# Patient Record
Sex: Female | Born: 1950
Health system: Southern US, Community
[De-identification: ages and names within clinical notes are randomized; demographics above are authoritative.]

## PROBLEM LIST (undated history)

## (undated) ENCOUNTER — Emergency Department (HOSPITAL_BASED_OUTPATIENT_CLINIC_OR_DEPARTMENT_OTHER): Payer: 59 | Source: Home / Self Care

## (undated) DIAGNOSIS — K219 Gastro-esophageal reflux disease without esophagitis: Secondary | ICD-10-CM

## (undated) DIAGNOSIS — M069 Rheumatoid arthritis, unspecified: Secondary | ICD-10-CM

## (undated) DIAGNOSIS — I10 Essential (primary) hypertension: Secondary | ICD-10-CM

## (undated) HISTORY — PX: NASAL SINUS SURGERY: SHX719

## (undated) HISTORY — PX: FINGER FRACTURE SURGERY: SHX638

## (undated) HISTORY — DX: Gastro-esophageal reflux disease without esophagitis: K21.9

## (undated) HISTORY — PX: OTHER SURGICAL HISTORY: SHX169

## (undated) HISTORY — PX: CERVICAL CONIZATION W/BX: SHX1330

## (undated) HISTORY — DX: Essential (primary) hypertension: I10

## (undated) HISTORY — PX: TONSILLECTOMY: SUR1361

## (undated) HISTORY — DX: Rheumatoid arthritis, unspecified: M06.9

## (undated) HISTORY — PX: BACK SURGERY: SHX140

---

## 1999-02-03 ENCOUNTER — Encounter: Admission: RE | Admit: 1999-02-03 | Discharge: 1999-05-04 | Payer: Self-pay | Admitting: Anesthesiology

## 2003-06-18 ENCOUNTER — Ambulatory Visit (HOSPITAL_COMMUNITY): Admission: RE | Admit: 2003-06-18 | Discharge: 2003-06-18 | Payer: Self-pay | Admitting: Unknown Physician Specialty

## 2003-06-18 ENCOUNTER — Encounter: Payer: Self-pay | Admitting: Unknown Physician Specialty

## 2005-03-04 ENCOUNTER — Ambulatory Visit (HOSPITAL_COMMUNITY): Admission: RE | Admit: 2005-03-04 | Discharge: 2005-03-04 | Payer: Self-pay | Admitting: *Deleted

## 2005-09-18 ENCOUNTER — Ambulatory Visit: Payer: Self-pay | Admitting: Cardiology

## 2005-09-21 ENCOUNTER — Ambulatory Visit: Payer: Self-pay | Admitting: Cardiology

## 2005-09-22 ENCOUNTER — Ambulatory Visit: Payer: Self-pay | Admitting: Cardiology

## 2005-09-25 ENCOUNTER — Inpatient Hospital Stay (HOSPITAL_BASED_OUTPATIENT_CLINIC_OR_DEPARTMENT_OTHER): Admission: RE | Admit: 2005-09-25 | Discharge: 2005-09-25 | Payer: Self-pay | Admitting: Cardiology

## 2005-09-25 ENCOUNTER — Ambulatory Visit: Payer: Self-pay | Admitting: Cardiology

## 2005-09-30 ENCOUNTER — Ambulatory Visit: Payer: Self-pay | Admitting: Cardiology

## 2005-10-22 ENCOUNTER — Ambulatory Visit: Payer: Self-pay | Admitting: Cardiology

## 2006-03-31 ENCOUNTER — Ambulatory Visit (HOSPITAL_COMMUNITY): Admission: RE | Admit: 2006-03-31 | Discharge: 2006-03-31 | Payer: Self-pay | Admitting: Unknown Physician Specialty

## 2006-05-25 ENCOUNTER — Ambulatory Visit (HOSPITAL_COMMUNITY): Admission: RE | Admit: 2006-05-25 | Discharge: 2006-05-25 | Payer: Self-pay | Admitting: Internal Medicine

## 2006-05-25 ENCOUNTER — Ambulatory Visit: Payer: Self-pay | Admitting: Internal Medicine

## 2007-04-04 ENCOUNTER — Ambulatory Visit (HOSPITAL_COMMUNITY): Admission: RE | Admit: 2007-04-04 | Discharge: 2007-04-04 | Payer: Self-pay | Admitting: Unknown Physician Specialty

## 2007-04-23 ENCOUNTER — Encounter: Admission: RE | Admit: 2007-04-23 | Discharge: 2007-04-23 | Payer: Self-pay | Admitting: Internal Medicine

## 2008-04-04 ENCOUNTER — Ambulatory Visit (HOSPITAL_COMMUNITY): Admission: RE | Admit: 2008-04-04 | Discharge: 2008-04-04 | Payer: Self-pay | Admitting: Internal Medicine

## 2009-04-08 ENCOUNTER — Ambulatory Visit (HOSPITAL_COMMUNITY): Admission: RE | Admit: 2009-04-08 | Discharge: 2009-04-08 | Payer: Self-pay | Admitting: Unknown Physician Specialty

## 2009-06-18 ENCOUNTER — Ambulatory Visit (HOSPITAL_COMMUNITY): Admission: RE | Admit: 2009-06-18 | Discharge: 2009-06-19 | Payer: Self-pay | Admitting: Neurosurgery

## 2010-04-21 ENCOUNTER — Ambulatory Visit (HOSPITAL_COMMUNITY): Admission: RE | Admit: 2010-04-21 | Discharge: 2010-04-21 | Payer: Self-pay | Admitting: Internal Medicine

## 2010-11-19 ENCOUNTER — Ambulatory Visit (HOSPITAL_COMMUNITY): Payer: Self-pay | Admitting: Psychiatry

## 2010-12-09 ENCOUNTER — Ambulatory Visit (HOSPITAL_COMMUNITY)
Admission: RE | Admit: 2010-12-09 | Discharge: 2010-12-09 | Payer: Self-pay | Source: Home / Self Care | Attending: Psychiatry | Admitting: Psychiatry

## 2010-12-23 ENCOUNTER — Ambulatory Visit (HOSPITAL_COMMUNITY)
Admission: RE | Admit: 2010-12-23 | Discharge: 2010-12-23 | Payer: Self-pay | Source: Home / Self Care | Attending: Psychiatry | Admitting: Psychiatry

## 2010-12-28 ENCOUNTER — Encounter: Payer: Self-pay | Admitting: Internal Medicine

## 2011-01-05 ENCOUNTER — Ambulatory Visit (HOSPITAL_COMMUNITY): Admit: 2011-01-05 | Payer: Self-pay | Admitting: Psychiatry

## 2011-01-05 ENCOUNTER — Ambulatory Visit (HOSPITAL_COMMUNITY)
Admission: RE | Admit: 2011-01-05 | Discharge: 2011-01-05 | Payer: Self-pay | Source: Home / Self Care | Attending: Psychiatry | Admitting: Psychiatry

## 2011-01-22 ENCOUNTER — Encounter (INDEPENDENT_AMBULATORY_CARE_PROVIDER_SITE_OTHER): Payer: 59 | Admitting: Psychiatry

## 2011-01-22 ENCOUNTER — Encounter (HOSPITAL_COMMUNITY): Payer: Self-pay | Admitting: Psychiatry

## 2011-01-22 DIAGNOSIS — F329 Major depressive disorder, single episode, unspecified: Secondary | ICD-10-CM

## 2011-01-22 DIAGNOSIS — F411 Generalized anxiety disorder: Secondary | ICD-10-CM

## 2011-01-29 ENCOUNTER — Encounter (HOSPITAL_COMMUNITY): Payer: 59 | Admitting: Psychiatry

## 2011-02-03 ENCOUNTER — Encounter (INDEPENDENT_AMBULATORY_CARE_PROVIDER_SITE_OTHER): Payer: 59 | Admitting: Psychiatry

## 2011-02-03 DIAGNOSIS — F411 Generalized anxiety disorder: Secondary | ICD-10-CM

## 2011-02-12 ENCOUNTER — Encounter (INDEPENDENT_AMBULATORY_CARE_PROVIDER_SITE_OTHER): Payer: 59 | Admitting: Psychiatry

## 2011-02-12 DIAGNOSIS — F329 Major depressive disorder, single episode, unspecified: Secondary | ICD-10-CM

## 2011-02-12 DIAGNOSIS — F411 Generalized anxiety disorder: Secondary | ICD-10-CM

## 2011-02-13 ENCOUNTER — Ambulatory Visit (HOSPITAL_COMMUNITY)
Admission: RE | Admit: 2011-02-13 | Discharge: 2011-02-13 | Disposition: A | Discharge status: Home / Self Care | Payer: 59 | Source: Ambulatory Visit | Attending: Internal Medicine | Admitting: Internal Medicine

## 2011-02-13 DIAGNOSIS — IMO0001 Reserved for inherently not codable concepts without codable children: Secondary | ICD-10-CM | POA: Insufficient documentation

## 2011-02-13 DIAGNOSIS — R269 Unspecified abnormalities of gait and mobility: Secondary | ICD-10-CM | POA: Insufficient documentation

## 2011-02-13 DIAGNOSIS — R42 Dizziness and giddiness: Secondary | ICD-10-CM | POA: Insufficient documentation

## 2011-02-17 ENCOUNTER — Ambulatory Visit (HOSPITAL_COMMUNITY)
Admission: RE | Admit: 2011-02-17 | Discharge: 2011-02-17 | Disposition: A | Discharge status: Home / Self Care | Payer: 59 | Source: Ambulatory Visit | Attending: *Deleted | Admitting: *Deleted

## 2011-02-19 ENCOUNTER — Ambulatory Visit (HOSPITAL_COMMUNITY): Payer: 59 | Admitting: *Deleted

## 2011-02-24 ENCOUNTER — Ambulatory Visit (HOSPITAL_COMMUNITY)
Admission: RE | Admit: 2011-02-24 | Discharge: 2011-02-24 | Disposition: A | Discharge status: Home / Self Care | Payer: 59 | Source: Ambulatory Visit | Attending: *Deleted | Admitting: *Deleted

## 2011-02-24 ENCOUNTER — Encounter (INDEPENDENT_AMBULATORY_CARE_PROVIDER_SITE_OTHER): Payer: 59 | Admitting: Psychiatry

## 2011-02-24 DIAGNOSIS — F411 Generalized anxiety disorder: Secondary | ICD-10-CM

## 2011-03-03 ENCOUNTER — Ambulatory Visit (HOSPITAL_COMMUNITY)
Admission: RE | Admit: 2011-03-03 | Discharge: 2011-03-03 | Disposition: A | Discharge status: Home / Self Care | Payer: 59 | Source: Ambulatory Visit | Attending: *Deleted | Admitting: *Deleted

## 2011-03-10 ENCOUNTER — Encounter (INDEPENDENT_AMBULATORY_CARE_PROVIDER_SITE_OTHER): Payer: 59 | Admitting: Psychiatry

## 2011-03-10 ENCOUNTER — Ambulatory Visit (HOSPITAL_COMMUNITY)
Admission: RE | Admit: 2011-03-10 | Discharge: 2011-03-10 | Disposition: A | Discharge status: Home / Self Care | Payer: 59 | Source: Ambulatory Visit | Attending: Internal Medicine | Admitting: Internal Medicine

## 2011-03-10 DIAGNOSIS — F329 Major depressive disorder, single episode, unspecified: Secondary | ICD-10-CM

## 2011-03-10 DIAGNOSIS — R42 Dizziness and giddiness: Secondary | ICD-10-CM | POA: Insufficient documentation

## 2011-03-10 DIAGNOSIS — F411 Generalized anxiety disorder: Secondary | ICD-10-CM

## 2011-03-10 DIAGNOSIS — R269 Unspecified abnormalities of gait and mobility: Secondary | ICD-10-CM | POA: Insufficient documentation

## 2011-03-10 DIAGNOSIS — IMO0001 Reserved for inherently not codable concepts without codable children: Secondary | ICD-10-CM | POA: Insufficient documentation

## 2011-03-15 LAB — URINALYSIS, ROUTINE W REFLEX MICROSCOPIC
Ketones, ur: NEGATIVE mg/dL
Nitrite: NEGATIVE
pH: 6.5 (ref 5.0–8.0)

## 2011-03-15 LAB — BASIC METABOLIC PANEL
BUN: 20 mg/dL (ref 6–23)
GFR calc Af Amer: >60 mL/min (ref >60)
Glucose, Bld: 89 mg/dL (ref 70–99)
Sodium: 140 mEq/L (ref 135–145)

## 2011-03-15 LAB — CBC
MCHC: 34.6 g/dL (ref 30.0–36.0)
MCV: 86.5 fL (ref 78.0–100.0)
Platelets: 136 10*3/uL — ABNORMAL LOW (ref 150–400)
RBC: 3.95 MIL/uL (ref 3.87–5.11)
RDW: 14.9 % (ref 11.5–15.5)
WBC: 3.3 10*3/uL — ABNORMAL LOW (ref 4.0–10.5)

## 2011-03-16 ENCOUNTER — Ambulatory Visit (HOSPITAL_COMMUNITY)
Admission: RE | Admit: 2011-03-16 | Discharge: 2011-03-16 | Disposition: A | Discharge status: Home / Self Care | Payer: 59 | Source: Ambulatory Visit | Attending: *Deleted | Admitting: *Deleted

## 2011-03-23 ENCOUNTER — Other Ambulatory Visit (HOSPITAL_COMMUNITY): Payer: Self-pay | Admitting: Internal Medicine

## 2011-03-23 DIAGNOSIS — Z139 Encounter for screening, unspecified: Secondary | ICD-10-CM

## 2011-03-31 ENCOUNTER — Encounter (INDEPENDENT_AMBULATORY_CARE_PROVIDER_SITE_OTHER): Payer: 59 | Admitting: Psychiatry

## 2011-03-31 ENCOUNTER — Ambulatory Visit (HOSPITAL_COMMUNITY): Payer: 59 | Admitting: Physical Therapy

## 2011-03-31 DIAGNOSIS — F411 Generalized anxiety disorder: Secondary | ICD-10-CM

## 2011-03-31 DIAGNOSIS — F329 Major depressive disorder, single episode, unspecified: Secondary | ICD-10-CM

## 2011-04-21 NOTE — Discharge Summary (Signed)
Tiffany Pollard, Tiffany Pollard               ACCOUNT NO.:  [id number]   MEDICAL RECORD NO.:  [id number]          PATIENT TYPE:  OIB   LOCATION:  3533                         FACILITY:  [facility]   PHYSICIAN:  Coletta Memos, M.D.     DATE OF BIRTH:  May 08, 1951   DATE OF ADMISSION:  06/18/2009  DATE OF DISCHARGE:  06/19/2009                               DISCHARGE SUMMARY   ADMITTING DIAGNOSES:  1. Displaced disk, right L4-5.  2. Right L5 radiculopathy.   POSTOPERATIVE DIAGNOSES:  1. Displaced disk, right L4-5.  2. Right L5 radiculopathy.   PROCEDURE:  Right L4-5 semi-hemilaminectomy with removal of the fragment  disk space with microdissection use.  Surgeon, Dr. Franky Macho.   DISCHARGE STATUS:  Alive and well.   DESTINATION:  Home.   MEDICATIONS:  None were given.  She has Darvocet to take at home.   She has voided, ambulated, and able to eat without difficulty.  Wound is  clean, dry.  There are no signs of infection at discharge.   Ms. Toren presented to my office yesterday in severe pain.  MRI showed  a very large disk fragment on right side at L4-5.  I, therefore, offered  and she agreed to undergo operative decompression yesterday.  She had  had time without relief.  She was weak also on the right lower  extremity.  She was admitted, taken to the operating room, had  absolutely no problems during her procedure.  Postoperatively, she was  doing much better, had some numbness in the right lower extremity which  is not unusual, and she will be discharged home.  She is given  instructions no heavy bending, lifting, twisting, turning.  Told to call  us in approximately for an appointment in 3-4 weeks.           ______________________________  Coletta Memos, M.D.     KC/MEDQ  D:  06/19/2009  T:  06/20/2009  Job:  [id number]

## 2011-04-21 NOTE — Op Note (Signed)
Tiffany Pollard, Tiffany Pollard               ACCOUNT NO.:  [id number]   MEDICAL RECORD NO.:  [id number]          PATIENT TYPE:  OIB   LOCATION:  3533                         FACILITY:  [facility]   PHYSICIAN:  Coletta Memos, M.D.     DATE OF BIRTH:  [birth year]   DATE OF PROCEDURE:  06/18/2009  DATE OF DISCHARGE:                               OPERATIVE REPORT   PREOPERATIVE DIAGNOSIS:  Displaced disk, right L4-5.   POSTOPERATIVE DIAGNOSIS:  Displaced disk, right L4-5.   PROCEDURE:  Right L4-5 semi-hemilaminectomy and diskectomy.   COMPLICATIONS:  None.   SURGEON:  Coletta Memos, MD   ASSISTANT:  Stefani Dama, MD   ANESTHESIA:  General endotracheal.   INDICATIONS:  Ms. Tiffany Pollard is a 60 year old presenting with severe  pain in the right lower extremity.  She has had this now for  approximately 1 month.  MRI shows a large disk herniation on the right  side at L4-5.  She underwent chiropractic treatment without benefit.  I  recommended she agree to undergo operative decompression.   OPERATIVE NOTE:  Tiffany Pollard was brought to the operating room,  intubated, and placed under general anesthetic.  She was rolled prone  onto a Wilson frame and all pressure points were properly padded.  Her  back was prepped.  She was draped in a sterile fashion.  I infiltrated  20 mL of 0.5% lidocaine with 1:200,000 strength epinephrine into the  lumbar region and paraspinous area.  I opened the skin with #10 blade.  I took the initial incision down to the thoracolumbar fascia.  I then  exposed the lamina of what was L4 and L5.  I used an intraoperative x-  ray to confirm my interlaminar location.  I placed a self-retaining  retractor and then proceeded with my semi-hemilaminectomy.   I used a high-speed drill and performed a semi-hemilaminectomy of L4 on  the right side.  I removed the ligamentum flavum afterwards and exposed  the thecal sac.  I brought the microscope into the operative field and  with Dr.  Verlee Rossetti assistance, we proceeded with the diskectomy.   Using microdissection, we were able to complete the diskectomy by  removing 3 very large chunks of material.  This had obviously come out  of the disk space, but we did not need to enter the disk space.  The  nerve was certainly decompressed.  Significant amount of epidural  bleeding which was easily controlled with Gelfoam and some slight  pressure that was controlled prior to closure.  With the microscope, we  were able to see that there were no piece left  behind.  I inspected medially, laterally, rostrally, and caudally.  I  then irrigated the wound.  I then closed wound in layered fashion using  Vicryl sutures to reapproximate the thoracolumbar, subcutaneous, and  subcuticular layers.  I used Dermabond for sterile dressing.           ______________________________  Coletta Memos, M.D.     KC/MEDQ  D:  06/18/2009  T:  06/19/2009  Job:  [id number]

## 2011-04-24 ENCOUNTER — Ambulatory Visit (HOSPITAL_COMMUNITY)
Admission: RE | Admit: 2011-04-24 | Discharge: 2011-04-24 | Disposition: A | Discharge status: Home / Self Care | Payer: 59 | Source: Ambulatory Visit | Attending: Internal Medicine | Admitting: Internal Medicine

## 2011-04-24 DIAGNOSIS — Z1231 Encounter for screening mammogram for malignant neoplasm of breast: Secondary | ICD-10-CM | POA: Insufficient documentation

## 2011-04-24 DIAGNOSIS — Z139 Encounter for screening, unspecified: Secondary | ICD-10-CM

## 2011-04-24 NOTE — Cardiovascular Report (Signed)
NAMEDATRA, CLARY               ACCOUNT NO.:  [id number]   MEDICAL RECORD NO.:  [id number]          PATIENT TYPE:  OIB   LOCATION:  1966                         FACILITY:  [facility]   PHYSICIAN:  Rollene Rotunda, M.D.   DATE OF BIRTH:  Nov 03, 1951   DATE OF PROCEDURE:  09/25/2005  DATE OF DISCHARGE:                              CARDIAC CATHETERIZATION   PRIMARY CARE PHYSICIAN:  Dr. Weyman Pedro   CARDIOLOGIST:  Dr. Jonelle Sidle, [facility] [place].   PROCEDURE NOTE:  1.  Left heart catheterization.  2.  Left coronary angiography.   INDICATIONS:  Evaluate patient with chest pain and abnormal Cardiolite  suggesting possible inferior ischemia.   PROCEDURE NOTE:  Left heart catheterization was performed via the right  femoral artery. The artery was cannulated using an anterior wall puncture. A  #4 French arterial sheath was inserted via the modified Seldinger technique.  A preformed Judkins and pigtail catheter were utilized.  The patient  tolerated the procedure well and left the lab in stable condition.   RESULTS/HEMODYNAMICS:  LV 184/21,  AO 177/89.   CORONARIES:  1.  The left main was normal.  2.  The LAD was normal.  The first diagonal was moderate sized and normal.      The second diagonal was moderate size and normal.  3.  The circumflex and the ramus intermediate was large and normal.  There      was a mid obtuse marginal which was large an normal.  4.  The right coronary artery was a large dominant and normal.  There was a      PDA which was moderate size and normal.  There were two posterolaterals      which were moderate size and normal.   LEFT VENTRICULOGRAM:  The left ventriculogram was obtained in the RAO  projection. The EF was 65% with normal wall motion.   CONCLUSION:  1.  Normal coronary arteries.  2.  Normal left ventricular function.   PLAN:  No further cardiac workup is suggested. The patient will continue to  follow up with Dr. Eliberto Ivory for  evaluation of chest discomfort if this recurs.  She also had presyncope.  She could be referred back to Dr. Myrtis Ser or Dr.  Diona Browner if she has further episodes of this.  Most likely an event monitor  would be warranted.           ______________________________  Rollene Rotunda, M.D.     JH/MEDQ  D:  09/25/2005  T:  09/25/2005  Job:  [id number]   cc:   Jonelle Sidle, M.D. [facility]  [address]  [place]  [place] [address]   Weyman Pedro, M.D.

## 2011-04-24 NOTE — Op Note (Signed)
[name], Pollard               ACCOUNT NO.:  [id number]   MEDICAL RECORD NO.:  [id number]          PATIENT TYPE:  AMB   LOCATION:  DAY                           FACILITY:  [facility]   PHYSICIAN:  Lionel December, M.D.    DATE OF BIRTH:  Oct 19, 1951   DATE OF PROCEDURE:  05/25/2006  DATE OF DISCHARGE:                                 OPERATIVE REPORT   PROCEDURE:  Esophagogastroduodenoscopy followed by colonoscopy.   Tiffany Pollard is a 60 year old Caucasian female with a history of the GERD and  rheumatoid arthritis who was noted to have developed mild anemia with a  significant drop in her ferritin as well as iron saturation and MCV.  Last  year, her serum ferritin was 26.  Hemoglobin was 12.6 and MC was 87 and last  month serum ferritin was 7 and hemoglobin 11.8, MCV 79 and saturation was  9%.  She is, therefore, felt to have iron deficiency anemia and is  undergoing diagnostic evaluation.  She has chronic GERD and her symptoms are  well controlled with PPI.  She denies melena or rectal bleeding.  Her stool  has been guaiac-negative.  The procedures and risks were reviewed with the  patient, informed consent was obtained.   MEDS FOR CONSCIOUS SEDATION:  Benzocaine spray for pharyngeal topical  anesthesia, Demerol 50 mg IV, Versed 12 mg IV.   FINDINGS AND PROCEDURES:  The procedure was performed in the endoscopy  suite.  The patient's vital signs and O2 sat were monitored during procedure  and remained stable.   Procedure 1:  Esophagogastroduodenoscopy.  The patient was placed in the  left lateral position and the Olympus videoscope was passed via the  oropharynx without any difficulty into esophagus.   Esophagus:  The mucosa of the esophagus was normal.  The GE junction was at  39 cm and hiatus was at 41.  She had a small sliding hiatal hernia.   Stomach:  It was empty and distended very well insufflation.  The folds and  proximal stomach were normal.  Examination of the mucosa at body,  antrum,  pyloric channel as well as angularis, fundus and cardia was normal.   Duodenum:  The bulbar mucosa was normal.  The scope was passed to the second  part of duodenum where mucosa and folds were normal.  Pictures were taken  for the record.  The endoscope was withdrawn.  The patient prepared for  procedure number 2.   Procedure 2:  Colonoscopy.  Rectal examination was performed.  No  abnormality noted on external or digital exam.  The Olympus videoscope was  placed in the rectum and advanced under vision into the sigmoid colon and  beyond.  Preparation was satisfactory.  There was a redundant transverse  colon.  Using using different positions and abdominal pressure, I was able  to pass the scope into the cecum which was identified by the appendiceal  orifice and ileocecal valve.  Pictures were taken for the record.  Short  segment of TI was also examined and was normal.  The colonic mucosa was  examined for the  second time on the way out and was normal throughout.  The  rectal mucosa was normal.  The scope was retroflexed to examine the  anorectal junction and small hemorrhoids were noted below the dentate line.  The endoscope was straightened and withdrawn.  The patient tolerated the  procedures well.   FINAL DIAGNOSIS:  1.  Small sliding hiatal hernia, otherwise, normal EGD.  2.  Normal terminal ileum and normal colonoscopy except small external      hemorrhoids.  3.  No lesion found to account for the patient's iron deficiency anemia      (mild anemia).  4.  It is possible that she has impaired iron absorption secondary to      chronic acid suppression and hopefully we can overcome that by giving      her iron orally.   PLAN:  She will go back on her iron pills b.i.d.  She will have hemoglobin  checked in the next couple weeks.  If her H&H keeps dropping, we will  consider further evaluation with Given capsule study, particularly if occult  GI bleed is documented in the  future.      Lionel December, M.D.  Electronically Signed     NR/MEDQ  D:  05/25/2006  T:  05/25/2006  Job:  [id number]

## 2011-04-28 ENCOUNTER — Encounter (INDEPENDENT_AMBULATORY_CARE_PROVIDER_SITE_OTHER): Payer: 59 | Admitting: Psychiatry

## 2011-04-28 DIAGNOSIS — F411 Generalized anxiety disorder: Secondary | ICD-10-CM

## 2011-04-28 DIAGNOSIS — F329 Major depressive disorder, single episode, unspecified: Secondary | ICD-10-CM

## 2011-05-19 ENCOUNTER — Encounter (INDEPENDENT_AMBULATORY_CARE_PROVIDER_SITE_OTHER): Payer: 59 | Admitting: Psychiatry

## 2011-05-19 DIAGNOSIS — F411 Generalized anxiety disorder: Secondary | ICD-10-CM

## 2011-05-26 ENCOUNTER — Encounter (INDEPENDENT_AMBULATORY_CARE_PROVIDER_SITE_OTHER): Payer: 59 | Admitting: Psychiatry

## 2011-05-26 DIAGNOSIS — F411 Generalized anxiety disorder: Secondary | ICD-10-CM

## 2011-05-26 DIAGNOSIS — F329 Major depressive disorder, single episode, unspecified: Secondary | ICD-10-CM

## 2011-06-02 ENCOUNTER — Encounter (INDEPENDENT_AMBULATORY_CARE_PROVIDER_SITE_OTHER): Payer: 59 | Admitting: Psychiatry

## 2011-06-02 DIAGNOSIS — F329 Major depressive disorder, single episode, unspecified: Secondary | ICD-10-CM

## 2011-06-02 DIAGNOSIS — F411 Generalized anxiety disorder: Secondary | ICD-10-CM

## 2011-06-16 ENCOUNTER — Encounter (INDEPENDENT_AMBULATORY_CARE_PROVIDER_SITE_OTHER): Payer: 59 | Admitting: Psychiatry

## 2011-06-16 DIAGNOSIS — F329 Major depressive disorder, single episode, unspecified: Secondary | ICD-10-CM

## 2011-06-16 DIAGNOSIS — F411 Generalized anxiety disorder: Secondary | ICD-10-CM

## 2011-06-29 ENCOUNTER — Encounter (INDEPENDENT_AMBULATORY_CARE_PROVIDER_SITE_OTHER): Payer: 59 | Admitting: Psychiatry

## 2011-06-29 DIAGNOSIS — F411 Generalized anxiety disorder: Secondary | ICD-10-CM

## 2011-06-29 DIAGNOSIS — F329 Major depressive disorder, single episode, unspecified: Secondary | ICD-10-CM

## 2011-06-30 ENCOUNTER — Encounter (HOSPITAL_COMMUNITY): Payer: 59 | Admitting: Psychiatry

## 2012-01-05 ENCOUNTER — Ambulatory Visit (INDEPENDENT_AMBULATORY_CARE_PROVIDER_SITE_OTHER): Payer: 59 | Admitting: Psychiatry

## 2012-01-05 ENCOUNTER — Encounter (HOSPITAL_COMMUNITY): Payer: Self-pay | Admitting: Psychiatry

## 2012-01-05 DIAGNOSIS — F419 Anxiety disorder, unspecified: Secondary | ICD-10-CM

## 2012-01-05 DIAGNOSIS — F411 Generalized anxiety disorder: Secondary | ICD-10-CM

## 2012-01-05 NOTE — Patient Instructions (Signed)
Continue to improve self-care regarding nutrition and exercise, use relaxation breathing, use mindfulness exercise using breath awareness, identify and challenge thinking errors (what if's)

## 2012-01-06 NOTE — Progress Notes (Signed)
Patient:  Tiffany Pollard   DOB: 1951/11/18  MR Number: [id number]  [facility]:  [address] [place],  [place], [address]  Start: Tuesday 01/05/2012 10:30 AM End: Tuesday 01/05/2012 11:20 AM  Provider/Observer:     Florencia Reasons, MSW, LCSW   Chief Complaint:      Chief Complaint  Patient presents with  . Anxiety    Reason For Service:     The patient is resuming services due to the experiencing increased anxiety and excessive worry. She is a returning patient to this practice and has a long-standing history of anxiety and depression. The patient initially was seen in this practice from December 2011 through July 2012. In recent months, she has faced several stressors including her dog going blind, her husband having 2 knee surgeries, her aunt dying on Thanksgiving day, and increased thoughts triggered by the Christmas holidays about her deceased step grandson who completed suicide in 2009/05/10 and whose birthday was January 20th. Patient also reports experiencing increased pain due to rheumatoid arthritis. Patient reports finding herself becoming more irritable, more tense, and having increased negative and perfectionistic thought patterns.  Interventions Strategy:  Supportive therapy, cognitive behavioral therapy  Participation Level:   Active  Participation Quality:  Appropriate and Sharing      Behavioral Observation:  Casual, Alert, and Appropriate.   Current Psychosocial Factors: Patient and her husband both have health issues. Patient's aunt died on Thanksgiving day 11-May-2011. Patient's deceased grandson's birthday was January 20.  Content of Session:   Reviewing symptoms, identifying stressors, processing feelings, examining thought patterns, identifying relaxation techniques, identifying ways to improve self-care  Current Status:   The patient reports anxiety, irritability, and ruminating thoughts.  Patient Progress:   Fair. The patient reports facings several  stressors along with experiencing increased pain related to her diagnosis of rheumatoid arthritis. She reports she has been trying to use her coping skills but also realizing that she is beginning to revert to old patterns and coping skills. Patient also reports that her husband has made a similar observation and encouraged her to resume therapy.  Target Goals:   Decrease anxiety and excessive worry  Last Reviewed:   01/05/2012  Goals Addressed Today:    Decrease anxiety and excessive worry  Impression/Diagnosis:   The patient presents with a history of symptoms of anxiety and depression with symptoms worsening in recent weeks. Patient has faced several stressors including the death of her, her dog going blind, the recent birthday of her deceased step grandson, her husband's health issues, and patient's increased pain related to her diagnosis of rheumatoid arthritis. Diagnoses: Anxiety disorder NOS rule out GAD, depressive disorder NOS  Diagnosis:  Axis I:  1. Anxiety disorder             Axis II: No diagnosis

## 2012-01-20 ENCOUNTER — Encounter (HOSPITAL_COMMUNITY): Payer: Self-pay | Admitting: Psychiatry

## 2012-01-20 ENCOUNTER — Ambulatory Visit (INDEPENDENT_AMBULATORY_CARE_PROVIDER_SITE_OTHER): Payer: 59 | Admitting: Psychiatry

## 2012-01-20 DIAGNOSIS — F411 Generalized anxiety disorder: Secondary | ICD-10-CM

## 2012-01-20 DIAGNOSIS — F329 Major depressive disorder, single episode, unspecified: Secondary | ICD-10-CM

## 2012-01-20 NOTE — Patient Instructions (Signed)
Discussed orally 

## 2012-01-20 NOTE — Progress Notes (Signed)
Patient:  Tiffany Pollard   DOB: 07-24-1951  MR Number: [id number]  Location: [facility]:  [address] [place] [place],  [place], [address]  Start: Wednesday 01/20/2012 8:40 AM End: Wednesday 01/20/2012 9:30 AM  Provider/Observer:     Florencia Reasons, MSW, LCSW   Chief Complaint:      Chief Complaint  Patient presents with  . Anxiety    Reason For Service:     The patient is resuming services due to the experiencing increased anxiety and excessive worry. She is a returning patient to this practice and has a long-standing history of anxiety and depression. The patient initially was seen in this practice from December 2011 through July 2012. In recent months, she has faced several stressors including her dog going blind, her husband having 2 knee surgeries, her aunt dying on Thanksgiving day, and increased thoughts triggered by the Christmas holidays about her deceased step grandson who completed suicide in 2010 and whose birthday was January 20th. Patient also reports experiencing increased pain due to rheumatoid arthritis. Patient reports finding herself becoming more irritable, more tense, and having increased negative and perfectionistic thought patterns.  Interventions Strategy:  Supportive therapy, cognitive behavioral therapy  Participation Level:   Active  Participation Quality:  Appropriate and Sharing      Behavioral Observation:  Casual, Alert, and anxious  Current Psychosocial Factors: Patient's husband has had increased health issues and doctor's appointments in the past 2 weeks.  Content of Session:   Reviewing symptoms, identifying stressors, processing feelings, examining thought patterns, identifying triggers of anxiety and details of panic attacks  Current Status:   The patient reports continued anxiety, irritability, muscle tension, excessive worry, and ruminating thoughts.   Patient Progress:   Fair. The patient reports increased thoughts and fear regarding her  husband's health in recent weeks. He currently is experiencing problems with his lungs and has seen doctors in the past 2 weeks. Patient fears something may happen to her and there would be no one to take care of her husband. Patient also is experiencing emotional and mental fatigue related to providing care  for her husband through various health issues and medical procedures for the past several years. Patient has concerns and fears about her health and fears having increased problems with vertigo. Patient has resumed use of meditation twice a day. She also has started crocheting. She continues to see her nieces once a week. Target Goals:   Decrease anxiety and excessive worry  Last Reviewed:   01/05/2012  Goals Addressed Today:    Decrease anxiety and excessive worry  Impression/Diagnosis:   The patient presents with a history of symptoms of anxiety and depression with symptoms worsening in recent weeks. Patient has faced several stressors including the death of her, her dog going blind, the recent birthday of her deceased step grandson, her husband's health issues, and patient's increased pain related to her diagnosis of rheumatoid arthritis. Diagnoses: Generalized anxiety disorder, depressive disorder NOS  Diagnosis:  Axis I:  1. Generalized anxiety disorder   2. Depressive disorder, not elsewhere classified             Axis II: No diagnosis

## 2012-02-02 ENCOUNTER — Encounter (HOSPITAL_COMMUNITY): Payer: Self-pay | Admitting: Psychiatry

## 2012-02-02 ENCOUNTER — Ambulatory Visit (INDEPENDENT_AMBULATORY_CARE_PROVIDER_SITE_OTHER): Payer: 59 | Admitting: Psychiatry

## 2012-02-02 DIAGNOSIS — F411 Generalized anxiety disorder: Secondary | ICD-10-CM

## 2012-02-02 NOTE — Patient Instructions (Signed)
Discussed orally 

## 2012-02-02 NOTE — Progress Notes (Signed)
Patient:  Tiffany Pollard   DOB: 1951-06-21  MR Number: [id number]  Location: [facility]:  [address] [place],  [place], [address]  Start: Tuesday 02/02/2012 10:30 AM End: Tuesday 02/02/2012 11:25 A  Provider/Observer:     Florencia Reasons, MSW, LCSW   Chief Complaint:      Chief Complaint  Patient presents with  . Anxiety    Reason For Service:     The patient is resuming services due to the experiencing increased anxiety and excessive worry. She is a returning patient to this practice and has a long-standing history of anxiety and depression. The patient initially was seen in this practice from December 2011 through July 2012. In recent months, she has faced several stressors including her dog going blind, her husband having 2 knee surgeries, her aunt dying on Thanksgiving day, and increased thoughts triggered by the Christmas holidays about her deceased step grandson who completed suicide in 2010 and whose birthday was January 20th. Patient also reports experiencing increased pain due to rheumatoid arthritis. Patient reports finding herself becoming more irritable, more tense, and having increased negative and perfectionistic thought patterns.  Interventions Strategy:  Supportive therapy, cognitive behavioral therapy  Participation Level:   Active  Participation Quality:  Appropriate and Sharing      Behavioral Observation:  Casual, Alert, and anxious  Current Psychosocial Factors: Patient continues to experience stress related to her health as well as her husband's health.  Content of Session:   Reviewing symptoms, identifying stressors, processing feelings, examining thought patterns, developing treatment plan  Current Status:   The patient reports continued anxiety, irritability, muscle tension, and excessive worry.  Patient Progress:   Fair. The patient reports feeling more energetic since recently taking injections that have reduced the fatigue associated with  patient's diagnosis of rheumatoid arthritis. She continues to experience pain but it is pleased that she is able to be more involved in activities. She has increased her physical activity farther distances. She has also maintained involvement with family. Patient also states she has been laughing more during the past 2 weeks. She reports increased success with identifying thinking errors but continued struggles challenging thinking errors. Patient reports increased thoughts about her deceased mother who died when patient 59-1/2. She continues to experience abandonment issues regarding her mother's death. Patient reports increased thoughts that she may die at age 64-1/2. Patient will be 60-1/2 in April.  Target Goals:   1. Decrease anxiety and panic attacks, decrease rigidity and increase flexibility as evidenced by adjusting schedule and daily activities without becoming overwhelmed. 2. Process and resolve grief and loss issues.  Last Reviewed:   02/02/2012  Goals Addressed Today:    Decrease anxiety and panic attacks, process grief and loss issues  Impression/Diagnosis:   The patient presents with a history of symptoms of anxiety and depression with symptoms worsening in recent weeks. Patient has faced several stressors including the death of her, her dog going blind, the recent birthday of her deceased step grandson, her husband's health issues, and patient's increased pain related to her diagnosis of rheumatoid arthritis. Diagnoses: Generalized anxiety disorder, depressive disorder NOS  Diagnosis:  Axis I:  1. Generalized anxiety disorder             Axis II: No diagnosis

## 2012-02-16 ENCOUNTER — Ambulatory Visit (INDEPENDENT_AMBULATORY_CARE_PROVIDER_SITE_OTHER): Payer: 59 | Admitting: Psychiatry

## 2012-02-16 DIAGNOSIS — F411 Generalized anxiety disorder: Secondary | ICD-10-CM

## 2012-02-16 NOTE — Patient Instructions (Signed)
Discussed orally 

## 2012-02-16 NOTE — Progress Notes (Signed)
Patient:  Tiffany Pollard   DOB: [birth year]  MR Number: [id number]  Location: [facility]:  [address] [place],  [place], [address]  Start: Tuesday 02/16/2012 10:30 AM End: Tuesday 02/16/2012 11:25 AM  Provider/Observer:     Florencia Reasons, MSW, LCSW   Chief Complaint:      Chief Complaint  Patient presents with  . Anxiety    Reason For Service:     The patient  resumed services due to the experiencing increased anxiety and excessive worry. She is a returning patient to this practice and has a long-standing history of anxiety and depression. The patient initially was seen in this practice from December 2011 through July 2012. In recent months, she has faced several stressors including her dog going blind, her husband having 2 knee surgeries, her aunt dying on Thanksgiving day, and increased thoughts triggered by the Christmas holidays about her deceased step grandson who completed suicide in 2010 and whose birthday was January 20th. Patient also reports experiencing increased pain due to rheumatoid arthritis. Patient reports finding herself becoming more irritable, more tense, and having increased negative and perfectionistic thought patterns. Today is a follow up appointment as patient continues to experience anxiety and grief/loss issues.  Interventions Strategy:  Supportive therapy, cognitive behavioral therapy  Participation Level:   Active  Participation Quality:  Appropriate and Sharing      Behavioral Observation:  Casual, Alert, and anxious  Current Psychosocial Factors: The 31st anniversary of patient's mother's death was this past 2023-06-02.  Content of Session:   Reviewing symptoms, identifying stressors, processing feelings, discussing patient's efforts to identify challenge cognitive distortions, exploring relaxation activities  Current Status:   The patient reports decreased worry, decreased irritability, decreased anxiety but continued muscle tension.  Patient  Progress:   Good. The patient reports improved mood and improved ability to successfully identify and challenging cognitive distortions and cites several examples. She has increased involvement in activity. She is pleased with the way she managed the anniversary of her mother's death this past Jun 02, 2023. She reports posting information in memory of her mother on her Face Book  and e-mailing information to relatives. Patient reports this is very helpful. She continues to have questions and negative feelings about her mother refusing to seek medical treatment but expresses an increased level of acceptance. The patient continues to experience muscle tension. She and therapist discussed possible activities such as yoga and massage.  Target Goals:   1. Decrease anxiety and panic attacks, decrease rigidity and increase flexibility as evidenced by adjusting schedule and daily activities without becoming overwhelmed. 2. Process and resolve grief and loss issues.  Last Reviewed:   02/02/2012  Goals Addressed Today:    Decrease anxiety and panic attacks, process grief and loss issues  Impression/Diagnosis:   The patient presents with a history of symptoms of anxiety and depression with symptoms worsening in recent weeks. Patient has faced several stressors including the death of her, her dog going blind, the recent birthday of her deceased step grandson, her husband's health issues, and patient's increased pain related to her diagnosis of rheumatoid arthritis. Diagnoses: Generalized anxiety disorder, depressive disorder NOS  Diagnosis:  Axis I:  1. Generalized anxiety disorder             Axis II: No diagnosis

## 2012-03-01 ENCOUNTER — Ambulatory Visit (INDEPENDENT_AMBULATORY_CARE_PROVIDER_SITE_OTHER): Payer: 59 | Admitting: Psychiatry

## 2012-03-01 ENCOUNTER — Encounter (HOSPITAL_COMMUNITY): Payer: Self-pay | Admitting: Psychiatry

## 2012-03-01 DIAGNOSIS — F411 Generalized anxiety disorder: Secondary | ICD-10-CM

## 2012-03-01 NOTE — Patient Instructions (Signed)
Discussed orally 

## 2012-03-01 NOTE — Progress Notes (Signed)
Patient:  Tiffany Pollard   DOB: 08-19-1951  MR Number: [id number]  Location: [facility]:  [address] [place],  [place], [address]  Start: Tuesday 03/01/2012 11:00 AM End: Tuesday 03/01/2012 11:55 AM  Provider/Observer:     Florencia Reasons, MSW, LCSW   Chief Complaint:      Chief Complaint  Patient presents with  . Anxiety    Reason For Service:     The patient  resumed services due to the experiencing increased anxiety and excessive worry. She is a returning patient to this practice and has a long-standing history of anxiety and depression. The patient initially was seen in this practice from December 2011 through July 2012. In recent months, she has faced several stressors including her dog going blind, her husband having 2 knee surgeries, her aunt dying on Thanksgiving day, and increased thoughts triggered by the Christmas holidays about her deceased step grandson who completed suicide in 2010 and whose birthday was January 20th. Patient also reports experiencing increased pain due to rheumatoid arthritis. Patient reports finding herself becoming more irritable, more tense, and having increased negative and perfectionistic thought patterns. Today is a follow up appointment as patient continues to experience anxiety and grief/loss issues.  Interventions Strategy:  Supportive therapy, cognitive behavioral therapy  Participation Level:   Active  Participation Quality:  Appropriate and Sharing      Behavioral Observation:  Casual, Alert, and tearful  Current Psychosocial Factors: The continues to experience grief and loss issues.  Content of Session:   Reviewing symptoms, identifying stressors, processing grief and loss issues  Current Status:   The patient reports decreased worry, decreased irritability, decreased anxiety but decreased involvement in activity due to increased pain.  Patient Progress:   Good. The patient reports continued improved mood and improved ability  to successfully identify and challenging cognitive distortions and cites several examples. She has decreased involvement in activity due to increased pain.  She reports increased thoughts about deceased grandson who completed suicide 2-3 years ago.  Therapist works with patient to discuss the events surrounding the suicide, identify and process patient's feelings on the night she was informed of the incident, and identify the effects on patient and the family.  Patient states remembering thinking she had no control and experiencing anxiety as well as breathing difficulty. She also states being afraid to talk about the incident and the events that occurred before now as she always thought if she did, she would lose control and "not be able to bring myself back".    Target Goals:   1. Decrease anxiety and panic attacks, decrease rigidity and increase flexibility as evidenced by adjusting schedule and daily activities without becoming overwhelmed. 2. Process and resolve grief and loss issues.  Last Reviewed:   02/02/2012  Goals Addressed Today:    Decrease anxiety and panic attacks, process grief and loss issues  Impression/Diagnosis:   The patient presents with a history of symptoms of anxiety and depression with symptoms worsening in recent weeks. Patient has faced several stressors including the death of her, her dog going blind, the recent birthday of her deceased step grandson, her husband's health issues, and patient's increased pain related to her diagnosis of rheumatoid arthritis. Diagnoses: Generalized anxiety disorder, depressive disorder NOS  Diagnosis:  Axis I:  1. Generalized anxiety disorder             Axis II: No diagnosis

## 2012-03-14 ENCOUNTER — Other Ambulatory Visit (HOSPITAL_COMMUNITY): Payer: Self-pay | Admitting: Unknown Physician Specialty

## 2012-03-14 ENCOUNTER — Other Ambulatory Visit (HOSPITAL_COMMUNITY): Payer: Self-pay | Admitting: *Deleted

## 2012-03-14 ENCOUNTER — Ambulatory Visit (INDEPENDENT_AMBULATORY_CARE_PROVIDER_SITE_OTHER): Payer: 59 | Admitting: Psychiatry

## 2012-03-14 DIAGNOSIS — Z139 Encounter for screening, unspecified: Secondary | ICD-10-CM

## 2012-03-14 DIAGNOSIS — F411 Generalized anxiety disorder: Secondary | ICD-10-CM

## 2012-03-15 ENCOUNTER — Ambulatory Visit (HOSPITAL_COMMUNITY): Payer: Self-pay | Admitting: Psychiatry

## 2012-03-15 NOTE — Progress Notes (Signed)
Patient:  Tiffany Pollard   DOB: Apr 12, 1951  MR Number: [id number]  Location: [facility]:  [address] [place],  [place], [address]  Start: Monday 03/14/2012  3:00  PM End: Monday 03/14/2012  3 50  PM  Provider/Observer:     Florencia Reasons, MSW, LCSW   Chief Complaint:      Chief Complaint  Patient presents with  . Anxiety    Reason For Service:     The patient  resumed services due to the experiencing increased anxiety and excessive worry. She is a returning patient to this practice and has a long-standing history of anxiety and depression. The patient initially was seen in this practice from December 2011 through July 2012. In recent months, she has faced several stressors including her dog going blind, her husband having 2 knee surgeries, her aunt dying on Thanksgiving day, and increased thoughts triggered by the Christmas holidays about her deceased step grandson who completed suicide in 2010 and whose birthday was January 20th. Patient also reports experiencing increased pain due to rheumatoid arthritis. Patient reported finding herself becoming more irritable, more tense, and having increased negative and perfectionistic thought patterns. Patient is seen today for a follow up appointment.  Interventions Strategy:  Supportive therapy, cognitive behavioral therapy  Participation Level:   Active  Participation Quality:  Appropriate and Sharing      Behavioral Observation:  Casual, Alert, and pleasant  Current Psychosocial Factors:   Content of Session:   Reviewing symptoms, discussing patient's progress, reviewing coping and relaxation techniques.  Current Status:   The patient reports decreased worry, decreased irritability, and decreased anxiety  Patient Progress:   Good. The patient reports continued improved mood and improved ability to successfully identify and challenging cognitive distortions. She also reports feeling better since processing grief/loss issues in  last session and having the opportunity to grieve. She discusses grandson today without becoming overwhelmed.  Patient has resumed normal interest in activities and is doing well managing self-care.  She also is successfully using coping and relaxation techniques. She continues to face stress regarding husband's health issues and her ongoing issues with rheumatoid arthritis but now reports feeling better about coping.  She also expresses an increased level of acceptance of her feelings regarding the deaths of her mother and grandson. Patient also reports being more accepting of self and less perfectionistic in her thought patterns resulting in decreased anxiety.  Therapist and patient agree that patient has accomplished her goals.  Therefore, services will be discontinued. Patient is encouraged to call this practice should she need services in the future.   Target Goals:   1. Decrease anxiety and panic attacks, decrease rigidity and increase flexibility as evidenced by adjusting schedule and daily activities without becoming overwhelmed. 2. Process and resolve grief and loss issues.  Last Reviewed:   02/02/2012  Goals Addressed Today:    Decrease anxiety and panic attacks, process grief and loss issues  Impression/Diagnosis:   The patient presents with a history of symptoms of anxiety and depression with symptoms worsening in recent weeks. Patient has faced several stressors including the death of her, her dog going blind, the recent birthday of her deceased step grandson, her husband's health issues, and patient's increased pain related to her diagnosis of rheumatoid arthritis. Diagnoses: Generalized anxiety disorder, depressive disorder NOS  Diagnosis:  Axis I:  1. GAD (generalized anxiety disorder)             Axis II: No diagnosis

## 2012-03-15 NOTE — Patient Instructions (Signed)
Discussed orally 

## 2012-04-25 ENCOUNTER — Ambulatory Visit (HOSPITAL_COMMUNITY)
Admission: RE | Admit: 2012-04-25 | Discharge: 2012-04-25 | Disposition: A | Discharge status: Home / Self Care | Payer: 59 | Source: Ambulatory Visit | Attending: Unknown Physician Specialty | Admitting: Unknown Physician Specialty

## 2012-04-25 DIAGNOSIS — Z139 Encounter for screening, unspecified: Secondary | ICD-10-CM

## 2012-04-25 DIAGNOSIS — Z1231 Encounter for screening mammogram for malignant neoplasm of breast: Secondary | ICD-10-CM | POA: Insufficient documentation

## 2012-05-18 ENCOUNTER — Other Ambulatory Visit (HOSPITAL_COMMUNITY): Payer: Self-pay | Admitting: Unknown Physician Specialty

## 2012-05-18 ENCOUNTER — Other Ambulatory Visit: Payer: Self-pay | Admitting: Unknown Physician Specialty

## 2012-05-18 DIAGNOSIS — Z139 Encounter for screening, unspecified: Secondary | ICD-10-CM

## 2013-03-22 ENCOUNTER — Other Ambulatory Visit (HOSPITAL_COMMUNITY): Payer: Self-pay | Admitting: Unknown Physician Specialty

## 2013-03-22 DIAGNOSIS — Z139 Encounter for screening, unspecified: Secondary | ICD-10-CM

## 2013-04-27 ENCOUNTER — Ambulatory Visit (HOSPITAL_COMMUNITY)
Admission: RE | Admit: 2013-04-27 | Discharge: 2013-04-27 | Disposition: A | Discharge status: Home / Self Care | Payer: 59 | Source: Ambulatory Visit | Attending: Unknown Physician Specialty | Admitting: Unknown Physician Specialty

## 2013-04-27 DIAGNOSIS — Z139 Encounter for screening, unspecified: Secondary | ICD-10-CM

## 2013-04-27 DIAGNOSIS — Z1231 Encounter for screening mammogram for malignant neoplasm of breast: Secondary | ICD-10-CM | POA: Insufficient documentation

## 2013-11-14 ENCOUNTER — Encounter (INDEPENDENT_AMBULATORY_CARE_PROVIDER_SITE_OTHER): Payer: Self-pay | Admitting: *Deleted

## 2013-12-11 ENCOUNTER — Telehealth (INDEPENDENT_AMBULATORY_CARE_PROVIDER_SITE_OTHER): Payer: Self-pay | Admitting: *Deleted

## 2013-12-11 ENCOUNTER — Encounter (INDEPENDENT_AMBULATORY_CARE_PROVIDER_SITE_OTHER): Payer: Self-pay | Admitting: Internal Medicine

## 2013-12-11 ENCOUNTER — Ambulatory Visit (INDEPENDENT_AMBULATORY_CARE_PROVIDER_SITE_OTHER): Payer: 59 | Admitting: Internal Medicine

## 2013-12-11 ENCOUNTER — Other Ambulatory Visit (INDEPENDENT_AMBULATORY_CARE_PROVIDER_SITE_OTHER): Payer: Self-pay | Admitting: *Deleted

## 2013-12-11 VITALS — BP 106/52 | HR 64 | Temp 99.0°F | Ht 66.0 in | Wt 147.0 lb

## 2013-12-11 DIAGNOSIS — Z1211 Encounter for screening for malignant neoplasm of colon: Secondary | ICD-10-CM

## 2013-12-11 DIAGNOSIS — R1031 Right lower quadrant pain: Secondary | ICD-10-CM

## 2013-12-11 DIAGNOSIS — R109 Unspecified abdominal pain: Secondary | ICD-10-CM

## 2013-12-11 DIAGNOSIS — G8929 Other chronic pain: Secondary | ICD-10-CM

## 2013-12-11 MED ORDER — PEG-KCL-NACL-NASULF-NA ASC-C 100 G PO SOLR: 1.0000 | Freq: Once | ORAL | Status: DC

## 2013-12-11 NOTE — Progress Notes (Addendum)
Subjective:     Patient ID: Tiffany Pollard, female   DOB: [birth year], 63 y.o.   MRN: [id number]  HPI  Referred to our office by Dr. Barrie Dunker.  She has been having lower abdominal pain. Sometimes it is located in the rt lower quadrant. She has had this pain for about 3 months.  She has had this pain in the past and was diagnosed with IBS by Dr. Liane Comber. Today, she does have some lower abdominal pain. She says if she becomes constipated, it will set the pain off. She usually has a BM once a day.  She eats fiber to keep from becoming constipated. Her appetite is good. She has lost 30 pounds in the past year which was intentional. No melena or bright red rectal bleeding. No family hx of colon cancer.  Her last colonoscopy/EGD 2007 for mild anemia and drop in there ferritin: FINAL DIAGNOSIS:  1. Small sliding hiatal hernia, otherwise, normal EGD.  2. Normal terminal ileum and normal colonoscopy except small external  hemorrhoids.  3. No lesion found to account for the patient's iron deficiency anemia  (mild anemia).  4. It is possible that she has impaired iron absorption secondary to  chronic acid suppression and hopefully we can overcome that by giving  her iron orally.   09/29/2023 H and H 13.1 and 39.6, Platelet ct 209 09/28/2013 Fecal occult blood: negative   She underwent an [place] for family hx of colon cancer.   11/07/2013 US abdomen: family of ovarian cancer: Tiny 1.1cm anterior subserosal fibroid. No abnormality otherwise noted. Ovaries appear normal.  Review of Systems  Married. No children. Retired from [employer].  Past Medical History  Diagnosis Date  . Rheumatoid arthritis(714.0)   . High blood pressure   . GERD (gastroesophageal reflux disease)    Current Outpatient Prescriptions on File Prior to Visit  Medication Sig Dispense Refill  . Ascorbic Acid (VITAMIN C) 1000 MG tablet Take 1,000 mg by mouth daily.      Marland Kitchen aspirin 81 MG EC tablet Take 81 mg by mouth daily. Swallow  whole.      . Diazepam (VALIUM PO) Take by mouth.      . etanercept (ENBREL) 50 MG/ML injection Inject 50 mg into the skin once a week.      . fish oil-omega-3 fatty acids 1000 MG capsule Take 2 g by mouth daily.      . Multiple Vitamins-Minerals (MULTIVITAMIN PO) Take by mouth.      . Naproxen Sodium (ALEVE PO) Take by mouth.       No current facility-administered medications on file prior to visit.   Past Surgical History  Procedure Laterality Date  . Back surgery    . Cervical conization w/bx    . Nasal sinus surgery    . Foot surgery for a bunion     No Known Allergies      Objective:   Physical Exam  Filed Vitals:   12/11/13 1052  BP: 106/52  Pulse: 64  Temp: 99 F (37.2 C)  Height: 5\' 6"  (1.676 m)  Weight: 147 lb (66.679 kg)   Alert and oriented. Skin warm and dry. Oral mucosa is moist.   . Sclera anicteric, conjunctivae is pink. Thyroid not enlarged. No cervical lymphadenopathy. Lungs clear. Heart regular rate and rhythm.  Abdomen is soft. Bowel sounds are positive. No hepatomegaly. No abdominal masses felt. No tenderness.  No edema to lower extremities.       Assessment:  Lower abdominal pain. Last colonoscopy was in 2007. Colonic neoplasm needs to be ruled out.    Plan:    Colonoscopy with Dr. Laural Golden.The risks and benefits such as perforation, bleeding, and infection were reviewed with the patient and is agreeable.

## 2013-12-11 NOTE — Patient Instructions (Signed)
Colonoscopy with Dr. Rehman. The risks and benefits such as perforation, bleeding, and infection were reviewed with the patient and is agreeable. 

## 2013-12-11 NOTE — Telephone Encounter (Signed)
Patient needs movi prep 

## 2013-12-12 ENCOUNTER — Encounter (HOSPITAL_COMMUNITY): Payer: Self-pay | Admitting: Pharmacy Technician

## 2013-12-12 ENCOUNTER — Encounter (INDEPENDENT_AMBULATORY_CARE_PROVIDER_SITE_OTHER): Payer: Self-pay | Admitting: *Deleted

## 2013-12-27 ENCOUNTER — Ambulatory Visit (HOSPITAL_COMMUNITY)
Admission: RE | Admit: 2013-12-27 | Discharge: 2013-12-27 | Disposition: A | Discharge status: Home / Self Care | Payer: 59 | Source: Ambulatory Visit | Attending: Internal Medicine | Admitting: Internal Medicine

## 2013-12-27 ENCOUNTER — Encounter (HOSPITAL_COMMUNITY)
Admission: RE | Disposition: A | Discharge status: Home / Self Care | Payer: Self-pay | Source: Ambulatory Visit | Attending: Internal Medicine

## 2013-12-27 ENCOUNTER — Encounter (HOSPITAL_COMMUNITY): Payer: Self-pay | Admitting: *Deleted

## 2013-12-27 DIAGNOSIS — K5730 Diverticulosis of large intestine without perforation or abscess without bleeding: Secondary | ICD-10-CM

## 2013-12-27 DIAGNOSIS — R109 Unspecified abdominal pain: Secondary | ICD-10-CM

## 2013-12-27 DIAGNOSIS — Z7982 Long term (current) use of aspirin: Secondary | ICD-10-CM | POA: Insufficient documentation

## 2013-12-27 DIAGNOSIS — K644 Residual hemorrhoidal skin tags: Secondary | ICD-10-CM | POA: Insufficient documentation

## 2013-12-27 DIAGNOSIS — K5900 Constipation, unspecified: Secondary | ICD-10-CM | POA: Insufficient documentation

## 2013-12-27 DIAGNOSIS — R030 Elevated blood-pressure reading, without diagnosis of hypertension: Secondary | ICD-10-CM | POA: Insufficient documentation

## 2013-12-27 HISTORY — PX: COLONOSCOPY: SHX5424

## 2013-12-27 SURGERY — COLONOSCOPY
Anesthesia: Moderate Sedation

## 2013-12-27 MED ORDER — STERILE WATER FOR IRRIGATION IR SOLN
Status: DC | PRN
  Administered 2013-12-27: 14:00:00

## 2013-12-27 MED ORDER — MIDAZOLAM HCL 5 MG/5ML IJ SOLN
INTRAMUSCULAR | Status: AC
  Filled 2013-12-27: qty 10

## 2013-12-27 MED ORDER — MEPERIDINE HCL 50 MG/ML IJ SOLN
INTRAMUSCULAR | Status: DC | PRN
  Administered 2013-12-27 (×2): 25 mg via INTRAVENOUS

## 2013-12-27 MED ORDER — SODIUM CHLORIDE 0.9 % IV SOLN
INTRAVENOUS | Status: DC
  Administered 2013-12-27: 1000 mL via INTRAVENOUS

## 2013-12-27 MED ORDER — PSYLLIUM 28 % PO PACK: 1.0000 | PACK | Freq: Every day | ORAL | Status: DC

## 2013-12-27 MED ORDER — MIDAZOLAM HCL 5 MG/5ML IJ SOLN
INTRAMUSCULAR | Status: DC | PRN
  Administered 2013-12-27 (×5): 2 mg via INTRAVENOUS

## 2013-12-27 MED ORDER — DOCUSATE SODIUM 100 MG PO CAPS: 200.0000 mg | ORAL_CAPSULE | Freq: Every day | ORAL | Status: DC

## 2013-12-27 MED ORDER — MEPERIDINE HCL 50 MG/ML IJ SOLN
INTRAMUSCULAR | Status: AC
  Filled 2013-12-27: qty 1

## 2013-12-27 NOTE — Op Note (Signed)
COLONOSCOPY PROCEDURE REPORT  PATIENT:  Tiffany Pollard  MR#:  [id number] Birthdate:  1951/03/26, 63 y.o., female Endoscopist:  Dr. Rogene Houston, MD Referred By:  Dr. Santo Held, MD   Procedure Date: 12/27/2013  Procedure:   Colonoscopy  Indications:  Patient is 63 year old Caucasian female with intermittent lower abdominal pain of a few months duration associated with constipation. She is undergoing diagnostic colonoscopy. Her last exam was in 2007.  Informed Consent:  The procedure and risks were reviewed with the patient and informed consent was obtained.  Medications:  Demerol 50 mg IV Versed 10 mg IV  Description of procedure:  After a digital rectal exam was performed, that colonoscope was advanced from the anus through the rectum and colon to the area of the cecum, ileocecal valve and appendiceal orifice. The cecum was deeply intubated. These structures were well-seen and photographed for the record. From the level of the cecum and ileocecal valve, the scope was slowly and cautiously withdrawn. The mucosal surfaces were carefully surveyed utilizing scope tip to flexion to facilitate fold flattening as needed. The scope was pulled down into the rectum where a thorough exam including retroflexion was performed.  Findings:   Prep satisfactory. Normal mucosa of cecum and ileocecal valve. No abnormality noted the rest of the mucosa except two small diverticula at sigmoid colon. Normal rectal mucosa. Small hemorrhoids below the dentate line.   Therapeutic/Diagnostic Maneuvers Performed:   None  Complications:  None  Cecal Withdrawal Time:  9 minutes  Impression:  Examination performed to cecum. Two small diverticula at sigmoid colon and small external hemorrhoids otherwise normal colonoscopy  Recommendations:  Standard instructions given. High fiber diet. Metamucil 4 g by mouth daily at bedtime. Colace 20 mg by mouth each bedtime.   Annaliz Aven U  12/27/2013 2:35  PM  CC: Dr. Monico Blitz, MD & Dr. Rayne Du ref. provider found CC Dr. Santo Held, MD

## 2013-12-27 NOTE — H&P (Signed)
Tiffany Pollard is an 63 y.o. female.   Chief Complaint: Patient is  here for colonoscopy. HPI: Patient is 63 year old Caucasian female who presents with a month's history of aching lower abdominal pain which is intermittent and usually made worse when she is constipated. She denies rectal bleeding melena. Last year she lost 30 pounds while she was on methotrexate she's not taking any more. She has undergone gynecologic evaluation was negative. She is undergoing diagnostic colonoscopy. Patient's last colonoscopy was in 2007.  Past Medical History  Diagnosis Date  . Rheumatoid arthritis(714.0)   . High blood pressure   . GERD (gastroesophageal reflux disease)     Past Surgical History  Procedure Laterality Date  . Back surgery    . Cervical conization w/bx    . Nasal sinus surgery    . Foot surgery for a bunion    . Tonsillectomy      History reviewed. No pertinent family history. Social History:  reports that she quit smoking about 9 years ago. She has never used smokeless tobacco. She reports that she does not drink alcohol or use illicit drugs.  Allergies: No Known Allergies  Medications Prior to Admission  Medication Sig Dispense Refill  . Ascorbic Acid (VITAMIN C) 1000 MG tablet Take 1,000 mg by mouth daily.      Marland Kitchen aspirin 81 MG EC tablet Take 81 mg by mouth daily. Swallow whole.      . Cholecalciferol (VITAMIN D PO) Take 1 tablet by mouth daily.      . Diazepam (VALIUM PO) Take 2.5 mg by mouth daily.       Marland Kitchen etanercept (ENBREL) 50 MG/ML injection Inject 50 mg into the skin once a week.      . fish oil-omega-3 fatty acids 1000 MG capsule Take 2 g by mouth daily.      . fluticasone (FLONASE) 50 MCG/ACT nasal spray Place 2 sprays into both nostrils daily as needed for allergies or rhinitis.      Marland Kitchen lisinopril (PRINIVIL,ZESTRIL) 20 MG tablet Take 20 mg by mouth daily.      . Multiple Vitamins-Minerals (MULTIVITAMIN PO) Take by mouth.      . Naproxen Sodium (ALEVE PO) Take 2  tablets by mouth daily.       . peg 3350 powder (MOVIPREP) 100 G SOLR Take 1 kit (200 g total) by mouth once.  1 kit  0  . VITAMIN E PO Take 1 tablet by mouth daily.        No results found for this or any previous visit (from the past 48 hour(s)). No results found.  ROS  Blood pressure 129/64, pulse 86, temperature 98.1 F (36.7 C), temperature source Oral, resp. rate 17, height 5' 6"  (1.676 m), weight 143 lb (64.864 kg), SpO2 100.00%. Physical Exam  Constitutional: She appears well-developed and well-nourished.  HENT:  Mouth/Throat: Oropharynx is clear and moist.  Eyes: Conjunctivae are normal. No scleral icterus.  Neck: No thyromegaly present.  Cardiovascular: Normal rate, regular rhythm and normal heart sounds.   No murmur heard. Respiratory: Effort normal and breath sounds normal.  GI: Soft. She exhibits no distension and no mass. There is no tenderness.  Musculoskeletal: She exhibits no edema.  Lymphadenopathy:    She has no cervical adenopathy.  Neurological: She is alert.  Skin: Skin is warm and dry.     Assessment/Plan Lower abdominal pain and constipation. Diagnostic colonoscopy.  Jamesetta Greenhalgh U 12/27/2013, 1:56 PM

## 2013-12-27 NOTE — Discharge Instructions (Signed)
Resume usual medications. High fiber diet. Metamucil 4 g by mouth daily at bedtime. Colace 200 mg by mouth each bedtime. No driving for 24 hours. Patient advised to call office progress report in 8 weeks.   High-Fiber Diet Fiber is found in fruits, vegetables, and grains. A high-fiber diet encourages the addition of more whole grains, legumes, fruits, and vegetables in your diet. The recommended amount of fiber for adult males is 38 g per day. For adult females, it is 25 g per day. Pregnant and lactating women should get 28 g of fiber per day. If you have a digestive or bowel problem, ask your caregiver for advice before adding high-fiber foods to your diet. Eat a variety of high-fiber foods instead of only a select few type of foods.  PURPOSE  To increase stool bulk.  To make bowel movements more regular to prevent constipation.  To lower cholesterol.  To prevent overeating. WHEN IS THIS DIET USED?  It may be used if you have constipation and hemorrhoids.  It may be used if you have uncomplicated diverticulosis (intestine condition) and irritable bowel syndrome.  It may be used if you need help with weight management.  It may be used if you want to add it to your diet as a protective measure against atherosclerosis, diabetes, and cancer. SOURCES OF FIBER  Whole-grain breads and cereals.  Fruits, such as apples, oranges, bananas, berries, prunes, and pears.  Vegetables, such as green peas, carrots, sweet potatoes, beets, broccoli, cabbage, spinach, and artichokes.  Legumes, such split peas, soy, lentils.  Almonds. FIBER CONTENT IN FOODS Starches and Grains / Dietary Fiber (g)  Cheerios, 1 cup / 3 g  Corn Flakes cereal, 1 cup / 0.7 g  Rice crispy treat cereal, 1 cup / 0.3 g  Instant oatmeal (cooked),  cup / 2 g  Frosted wheat cereal, 1 cup / 5.1 g  Brown, long-grain rice (cooked), 1 cup / 3.5 g  White, long-grain rice (cooked), 1 cup / 0.6 g  Enriched macaroni  (cooked), 1 cup / 2.5 g Legumes / Dietary Fiber (g)  Baked beans (canned, plain, or vegetarian),  cup / 5.2 g  Kidney beans (canned),  cup / 6.8 g  Pinto beans (cooked),  cup / 5.5 g Breads and Crackers / Dietary Fiber (g)  Plain or honey graham crackers, 2 squares / 0.7 g  Saltine crackers, 3 squares / 0.3 g  Plain, salted pretzels, 10 pieces / 1.8 g  Whole-wheat bread, 1 slice / 1.9 g  White bread, 1 slice / 0.7 g  Raisin bread, 1 slice / 1.2 g  Plain bagel, 3 oz / 2 g  Flour tortilla, 1 oz / 0.9 g  Corn tortilla, 1 small / 1.5 g  Hamburger or hotdog bun, 1 small / 0.9 g Fruits / Dietary Fiber (g)  Apple with skin, 1 medium / 4.4 g  Sweetened applesauce,  cup / 1.5 g  Banana,  medium / 1.5 g  Grapes, 10 grapes / 0.4 g  Orange, 1 small / 2.3 g  Raisin, 1.5 oz / 1.6 g  Melon, 1 cup / 1.4 g Vegetables / Dietary Fiber (g)  Green beans (canned),  cup / 1.3 g  Carrots (cooked),  cup / 2.3 g  Broccoli (cooked),  cup / 2.8 g  Peas (cooked),  cup / 4.4 g  Mashed potatoes,  cup / 1.6 g  Lettuce, 1 cup / 0.5 g  Corn (canned),  cup / 1.6 g  Tomato,  cup / 1.1 g Document Released: 11/23/2005 Document Revised: 05/24/2012 Document Reviewed: 02/25/2012 [facility] Patient Information 2014 [place]. Colonoscopy, Care After Refer to this sheet in the next few weeks. These instructions provide you with information on caring for yourself after your procedure. Your health care provider may also give you more specific instructions. Your treatment has been planned according to current medical practices, but problems sometimes occur. Call your health care provider if you have any problems or questions after your procedure. WHAT TO EXPECT AFTER THE PROCEDURE  After your procedure, it is typical to have the following:  A small amount of blood in your stool.  Moderate amounts of gas and mild abdominal cramping or bloating. HOME CARE INSTRUCTIONS  Do not  drive, operate machinery, or sign important documents for 24 hours.  You may shower and resume your regular physical activities, but move at a slower pace for the first 24 hours.  Take frequent rest periods for the first 24 hours.  Walk around or put a warm pack on your abdomen to help reduce abdominal cramping and bloating.  Drink enough fluids to keep your urine clear or pale yellow.  You may resume your normal diet as instructed by your health care provider. Avoid heavy or fried foods that are hard to digest.  Avoid drinking alcohol for 24 hours or as instructed by your health care provider.  Only take over-the-counter or prescription medicines as directed by your health care provider.  If a tissue sample (biopsy) was taken during your procedure:  Do not take aspirin or blood thinners for 7 days, or as instructed by your health care provider.  Do not drink alcohol for 7 days, or as instructed by your health care provider.  Eat soft foods for the first 24 hours. SEEK MEDICAL CARE IF: You have persistent spotting of blood in your stool 2 3 days after the procedure. SEEK IMMEDIATE MEDICAL CARE IF:  You have more than a small spotting of blood in your stool.  You pass large blood clots in your stool.  Your abdomen is swollen (distended).  You have nausea or vomiting.  You have a fever.  You have increasing abdominal pain that is not relieved with medicine. Document Released: 07/07/2004 Document Revised: 09/13/2013 Document Reviewed: 07/31/2013 [facility] Patient Information 2014 [place].

## 2014-01-01 ENCOUNTER — Encounter (HOSPITAL_COMMUNITY): Payer: Self-pay | Admitting: Internal Medicine

## 2014-03-19 ENCOUNTER — Encounter (INDEPENDENT_AMBULATORY_CARE_PROVIDER_SITE_OTHER): Payer: Self-pay

## 2014-04-06 ENCOUNTER — Other Ambulatory Visit (HOSPITAL_COMMUNITY): Payer: Self-pay | Admitting: Unknown Physician Specialty

## 2014-04-06 DIAGNOSIS — Z1231 Encounter for screening mammogram for malignant neoplasm of breast: Secondary | ICD-10-CM

## 2014-05-08 ENCOUNTER — Ambulatory Visit (HOSPITAL_COMMUNITY)
Admission: RE | Admit: 2014-05-08 | Discharge: 2014-05-08 | Disposition: A | Discharge status: Home / Self Care | Payer: 59 | Source: Ambulatory Visit | Attending: Unknown Physician Specialty | Admitting: Unknown Physician Specialty

## 2014-05-08 DIAGNOSIS — R928 Other abnormal and inconclusive findings on diagnostic imaging of breast: Secondary | ICD-10-CM | POA: Insufficient documentation

## 2014-05-08 DIAGNOSIS — Z1231 Encounter for screening mammogram for malignant neoplasm of breast: Secondary | ICD-10-CM | POA: Insufficient documentation

## 2014-05-10 ENCOUNTER — Other Ambulatory Visit: Payer: Self-pay | Admitting: Unknown Physician Specialty

## 2014-05-10 DIAGNOSIS — R928 Other abnormal and inconclusive findings on diagnostic imaging of breast: Secondary | ICD-10-CM

## 2014-05-23 ENCOUNTER — Other Ambulatory Visit: Payer: Self-pay | Admitting: Unknown Physician Specialty

## 2014-05-23 ENCOUNTER — Ambulatory Visit (HOSPITAL_COMMUNITY)
Admission: RE | Admit: 2014-05-23 | Discharge: 2014-05-23 | Disposition: A | Discharge status: Home / Self Care | Payer: 59 | Source: Ambulatory Visit | Attending: Unknown Physician Specialty | Admitting: Unknown Physician Specialty

## 2014-05-23 ENCOUNTER — Encounter (HOSPITAL_COMMUNITY): Payer: Self-pay

## 2014-05-23 DIAGNOSIS — R928 Other abnormal and inconclusive findings on diagnostic imaging of breast: Secondary | ICD-10-CM

## 2014-05-23 DIAGNOSIS — N6009 Solitary cyst of unspecified breast: Secondary | ICD-10-CM | POA: Insufficient documentation

## 2014-05-23 MED ORDER — LIDOCAINE-EPINEPHRINE (PF) 2 %-1:200000 IJ SOLN
INTRAMUSCULAR | Status: AC
  Administered 2014-05-23: 20 mL
  Filled 2014-05-23: qty 20

## 2015-04-08 ENCOUNTER — Other Ambulatory Visit (HOSPITAL_COMMUNITY): Payer: Self-pay | Admitting: Unknown Physician Specialty

## 2015-04-08 DIAGNOSIS — Z1231 Encounter for screening mammogram for malignant neoplasm of breast: Secondary | ICD-10-CM

## 2015-04-10 ENCOUNTER — Other Ambulatory Visit (HOSPITAL_COMMUNITY): Payer: Self-pay | Admitting: Rheumatology

## 2015-04-10 ENCOUNTER — Ambulatory Visit (HOSPITAL_COMMUNITY)
Admission: RE | Admit: 2015-04-10 | Discharge: 2015-04-10 | Disposition: A | Discharge status: Home / Self Care | Payer: 59 | Source: Ambulatory Visit | Attending: Rheumatology | Admitting: Rheumatology

## 2015-04-10 DIAGNOSIS — M069 Rheumatoid arthritis, unspecified: Secondary | ICD-10-CM | POA: Insufficient documentation

## 2015-05-10 ENCOUNTER — Ambulatory Visit (HOSPITAL_COMMUNITY)
Admission: RE | Admit: 2015-05-10 | Discharge: 2015-05-10 | Disposition: A | Discharge status: Home / Self Care | Payer: 59 | Source: Ambulatory Visit | Attending: Unknown Physician Specialty | Admitting: Unknown Physician Specialty

## 2015-05-10 DIAGNOSIS — Z1231 Encounter for screening mammogram for malignant neoplasm of breast: Secondary | ICD-10-CM | POA: Diagnosis not present

## 2016-04-07 ENCOUNTER — Other Ambulatory Visit (HOSPITAL_COMMUNITY): Payer: Self-pay | Admitting: Unknown Physician Specialty

## 2016-04-07 DIAGNOSIS — Z1231 Encounter for screening mammogram for malignant neoplasm of breast: Secondary | ICD-10-CM

## 2016-05-11 ENCOUNTER — Ambulatory Visit (HOSPITAL_COMMUNITY)
Admission: RE | Admit: 2016-05-11 | Discharge: 2016-05-11 | Disposition: A | Discharge status: Home / Self Care | Payer: 59 | Source: Ambulatory Visit | Attending: Unknown Physician Specialty | Admitting: Unknown Physician Specialty

## 2016-05-11 DIAGNOSIS — Z1231 Encounter for screening mammogram for malignant neoplasm of breast: Secondary | ICD-10-CM | POA: Insufficient documentation

## 2016-09-24 ENCOUNTER — Other Ambulatory Visit (HOSPITAL_COMMUNITY): Payer: Self-pay | Admitting: Orthopedic Surgery

## 2016-09-24 DIAGNOSIS — M5441 Lumbago with sciatica, right side: Secondary | ICD-10-CM

## 2016-09-24 DIAGNOSIS — M5442 Lumbago with sciatica, left side: Principal | ICD-10-CM

## 2016-09-28 ENCOUNTER — Ambulatory Visit (HOSPITAL_COMMUNITY)
Admission: RE | Admit: 2016-09-28 | Discharge: 2016-09-28 | Disposition: A | Discharge status: Home / Self Care | Payer: 59 | Source: Ambulatory Visit | Attending: Orthopedic Surgery | Admitting: Orthopedic Surgery

## 2016-09-28 DIAGNOSIS — M5441 Lumbago with sciatica, right side: Secondary | ICD-10-CM | POA: Diagnosis present

## 2016-09-28 DIAGNOSIS — M5124 Other intervertebral disc displacement, thoracic region: Secondary | ICD-10-CM | POA: Insufficient documentation

## 2016-09-28 DIAGNOSIS — Z9889 Other specified postprocedural states: Secondary | ICD-10-CM | POA: Insufficient documentation

## 2016-09-28 DIAGNOSIS — Q619 Cystic kidney disease, unspecified: Secondary | ICD-10-CM | POA: Insufficient documentation

## 2016-09-28 DIAGNOSIS — M5442 Lumbago with sciatica, left side: Secondary | ICD-10-CM | POA: Insufficient documentation

## 2016-09-28 LAB — POCT I-STAT CREATININE: Creatinine, Ser: 0.8 mg/dL (ref 0.44–1.00)

## 2016-09-28 MED ORDER — GADOBENATE DIMEGLUMINE 529 MG/ML IV SOLN
15.0000 mL | Freq: Once | INTRAVENOUS | Status: AC | PRN
  Administered 2016-09-28: 13 mL via INTRAVENOUS

## 2016-10-12 ENCOUNTER — Telehealth: Payer: Self-pay | Admitting: Radiology

## 2016-10-12 NOTE — Telephone Encounter (Signed)
Refill request received via fax for enbrel/ [place]

## 2016-10-13 MED ORDER — ETANERCEPT 50 MG/ML ~~LOC~~ SOAJ: 50.0000 mg | SUBCUTANEOUS | 0 refills | Status: DC

## 2016-10-13 NOTE — Telephone Encounter (Signed)
Last visit 06/18/16 Next visit 11/11/16 Labs 09/18/16 TB neg 03/2016 Ok to refill per Dr Estanislado Pandy

## 2016-11-11 ENCOUNTER — Ambulatory Visit: Payer: Self-pay | Admitting: Rheumatology

## 2016-11-26 ENCOUNTER — Ambulatory Visit: Payer: Self-pay | Admitting: Rheumatology

## 2016-12-08 DIAGNOSIS — M19072 Primary osteoarthritis, left ankle and foot: Secondary | ICD-10-CM

## 2016-12-08 DIAGNOSIS — M19042 Primary osteoarthritis, left hand: Secondary | ICD-10-CM

## 2016-12-08 DIAGNOSIS — M0579 Rheumatoid arthritis with rheumatoid factor of multiple sites without organ or systems involvement: Secondary | ICD-10-CM | POA: Insufficient documentation

## 2016-12-08 DIAGNOSIS — Z79899 Other long term (current) drug therapy: Secondary | ICD-10-CM | POA: Insufficient documentation

## 2016-12-08 DIAGNOSIS — M47816 Spondylosis without myelopathy or radiculopathy, lumbar region: Secondary | ICD-10-CM | POA: Insufficient documentation

## 2016-12-08 DIAGNOSIS — M19071 Primary osteoarthritis, right ankle and foot: Secondary | ICD-10-CM | POA: Insufficient documentation

## 2016-12-08 DIAGNOSIS — Z87891 Personal history of nicotine dependence: Secondary | ICD-10-CM | POA: Insufficient documentation

## 2016-12-08 DIAGNOSIS — M19041 Primary osteoarthritis, right hand: Secondary | ICD-10-CM | POA: Insufficient documentation

## 2016-12-08 NOTE — Progress Notes (Signed)
Office Visit Note  Patient: Tiffany Pollard             Date of Birth: 21-Nov-1951           MRN: [id number]             PCP: Monico Blitz, MD Referring: Monico Blitz, MD Visit Date: 12/09/2016 Occupation: _0 @    Subjective:  Pain of the Right Hand; Pain of the Left Hand; Pain of the Right Knee; and Pain of the Left Knee Follow-up on rheumatoid arthritis and high risk prescription.  History of Present Illness: Tiffany Pollard is a 66 y.o. female  Last seen 06/17/2016 Patient's rheumatoid arthritis is doing well. No joint pain swelling and stiffness. Morning stiffness lasts for less than 15 minutes and is all resolved by the time she finishes her warm shower.  She does have minor discomfort and stiffness to her hands knees feet from osteoarthritis and not from rheumatoid arthritis.  Patient is using Enbrel every week as prescribed and having good results with Enbrel. Note that patient was on methotrexate at one time but she failed monotherapy and because of increased fatigue side effect we had to discontinue the medication altogether. She also failed Arava because it causes her liver functions to elevate. She's doing great with monotherapy of Enbrel. At this time we want to continue Enbrel.  Patient will turn 70 in October 2018 and is concerned that she'll be in the donut hole and will not be able to afford the Enbrel anymore. Her husband is quite sick and is already and don't hold from the medications that he is currently on.  Activities of Daily Living:  Patient reports morning stiffness for 15 minutes.   Patient Denies nocturnal pain.  Difficulty dressing/grooming: Denies Difficulty climbing stairs: Denies Difficulty getting out of chair: Denies Difficulty using hands for taps, buttons, cutlery, and/or writing: Denies   Review of Systems  Constitutional: Negative for fatigue.  HENT: Negative for mouth sores and mouth dryness.   Eyes: Negative for dryness.    Respiratory: Negative for shortness of breath.   Gastrointestinal: Negative for constipation and diarrhea.  Musculoskeletal: Negative for myalgias and myalgias.  Skin: Negative for sensitivity to sunlight.  Psychiatric/Behavioral: Negative for decreased concentration and sleep disturbance.    PMFS History:  Patient Active Problem List   Diagnosis Date Noted  . Rheumatoid arthritis involving multiple sites with positive rheumatoid factor ([place]) 12/08/2016  . High risk medication use 12/08/2016  . Primary osteoarthritis of both hands 12/08/2016  . Primary osteoarthritis of both feet 12/08/2016  . Osteoarthritis of lumbar spine 12/08/2016  . Former smoker 12/08/2016  . Abdominal pain, chronic, right lower quadrant 12/11/2013    Past Medical History:  Diagnosis Date  . GERD (gastroesophageal reflux disease)   . High blood pressure   . Rheumatoid arthritis(714.0)     No family history on file. Past Surgical History:  Procedure Laterality Date  . BACK SURGERY    . CERVICAL CONIZATION W/BX    . COLONOSCOPY N/A 12/27/2013   Procedure: COLONOSCOPY;  Surgeon: Rogene Houston, MD;  Location: [facility];  Service: Endoscopy;  Laterality: N/A;  320-moved to [address] to notify pt  . foot surgery for a bunion    . NASAL SINUS SURGERY    . TONSILLECTOMY     Social History   Social History Narrative  . No narrative on file     Objective: Vital Signs: BP 118/60   Pulse 76  Resp 14   Ht 5' 6" (1.676 m)   Wt 160 lb (72.6 kg)   BMI 25.82 kg/m    Physical Exam  Constitutional: She is oriented to person, place, and time. She appears well-developed and well-nourished.  HENT:  Head: Normocephalic and atraumatic.  Eyes: EOM are normal. Pupils are equal, round, and reactive to light.  Cardiovascular: Normal rate, regular rhythm and normal heart sounds.  Exam reveals no gallop and no friction rub.   No murmur heard. Pulmonary/Chest: Effort normal and breath sounds normal. She has no  wheezes. She has no rales.  Abdominal: Soft. Bowel sounds are normal. She exhibits no distension. There is no tenderness. There is no guarding. No hernia.  Musculoskeletal: Normal range of motion. She exhibits no edema, tenderness or deformity.  Lymphadenopathy:    She has no cervical adenopathy.  Neurological: She is alert and oriented to person, place, and time. Coordination normal.  Skin: Skin is warm and dry. Capillary refill takes less than 2 seconds. No rash noted.  Psychiatric: She has a normal mood and affect. Her behavior is normal.  Nursing note and vitals reviewed.    Musculoskeletal Exam:  Full range of motion of all joints Grip strength is equal and strong bilaterally Fibromyalgia tender points are all absent  CDAI Exam: CDAI Homunculus Exam:   Joint Counts:  CDAI Tender Joint count: 0 CDAI Swollen Joint count: 0  Global Assessments:  Patient Global Assessment: 2 Provider Global Assessment: 2  CDAI Calculated Score: 4    Investigation: Findings:  April 2017 DB: Negative, 04/2015 hepatitis panel negative May 2016 zoster vaccine, 09/17/2016 CBC normal, CMP normal    Imaging: No results found.  Speciality Comments: No specialty comments available.    Procedures:  No procedures performed Allergies: Patient has no known allergies.   Assessment / Plan:     Visit Diagnoses: High risk medication use - Enbrel 50 mg SQ qweek - Plan: CBC with Differential/Platelet, COMPLETE METABOLIC PANEL WITH GFR, Quantiferon tb gold assay (blood), CBC with Differential/Platelet, CMP14+EGFR, Quantiferon tb gold assay (blood), CBC with Differential/Platelet  Rheumatoid arthritis involving multiple sites with positive rheumatoid factor (HCC) -  +RF,+CCP, -ANA  Primary osteoarthritis of both hands  Primary osteoarthritis of both feet  DDD lumbar spine - s/p discectomy  Former smoker   Plan: #1: Rheumatoid arthritis. Doing well with weekly Enbrel injections adequate  response. Patient is worried that in October 2018 she'll be 66 years old. She'll be on Medicare. She feels that she will be in a donut hole and will not be able to afford Enbrel. Her husband is currently not doing well and has a similar situation where he is in a donut hole due to his medications. We discussed that when she returns to clinic in 5 months we can discuss further about possible solutions to this issue.  #2: Continue Enbrel for now. No change in treatment okay to refill Enbrel if due  #3: CBC with differential and CMP with GFR today in office  #4: CBC with differential CMP with GFR and TB Gold in April 2018 at patient's PCPs office (the have [facility])  #5: Return to clinic in 5 months  #6: I discussed with patient exercises she can do for her osteoarthritis of the hands and to minimize muscle atrophy.    Her other medical problems include: Depression, hypertension, hyperlipidemia, IBS, hypercalcemia, renal calcinosis, reflux, vertigo, migraines, anemia, seborrhea, history of recurrent pyelonephritis.  Orders: Orders Placed This Encounter  Procedures  .  CBC with Differential/Platelet  . COMPLETE METABOLIC PANEL WITH GFR  . Quantiferon tb gold assay (blood)  . CBC with Differential/Platelet  . CMP14+EGFR   No orders of the defined types were placed in this encounter.   Face-to-face time spent with patient was 30 minutes. 50% of time was spent in counseling and coordination of care.  Follow-Up Instructions: Return in about 5 months (around 05/09/2017) for ra, enbrel q wk, oa hand, feet, ddd l spine.   Eliezer Lofts, PA-C   Patient had no synovitis on examination today. She will continue current treatment for right now.  I examined and evaluated the patient with Eliezer Lofts PA. The plan of care was discussed as noted above.  Bo Merino, MD

## 2016-12-09 ENCOUNTER — Ambulatory Visit (INDEPENDENT_AMBULATORY_CARE_PROVIDER_SITE_OTHER): Payer: 59 | Admitting: Rheumatology

## 2016-12-09 ENCOUNTER — Encounter: Payer: Self-pay | Admitting: Rheumatology

## 2016-12-09 VITALS — BP 118/60 | HR 76 | Resp 14 | Ht 66.0 in | Wt 160.0 lb

## 2016-12-09 DIAGNOSIS — M0579 Rheumatoid arthritis with rheumatoid factor of multiple sites without organ or systems involvement: Secondary | ICD-10-CM

## 2016-12-09 DIAGNOSIS — M19041 Primary osteoarthritis, right hand: Secondary | ICD-10-CM

## 2016-12-09 DIAGNOSIS — M19072 Primary osteoarthritis, left ankle and foot: Secondary | ICD-10-CM | POA: Diagnosis not present

## 2016-12-09 DIAGNOSIS — Z79899 Other long term (current) drug therapy: Secondary | ICD-10-CM

## 2016-12-09 DIAGNOSIS — M19071 Primary osteoarthritis, right ankle and foot: Secondary | ICD-10-CM

## 2016-12-09 DIAGNOSIS — M19042 Primary osteoarthritis, left hand: Secondary | ICD-10-CM | POA: Diagnosis not present

## 2016-12-09 DIAGNOSIS — Z87891 Personal history of nicotine dependence: Secondary | ICD-10-CM | POA: Diagnosis not present

## 2016-12-09 DIAGNOSIS — M47816 Spondylosis without myelopathy or radiculopathy, lumbar region: Secondary | ICD-10-CM | POA: Diagnosis not present

## 2016-12-09 LAB — CBC WITH DIFFERENTIAL/PLATELET
BASOS PCT: 1 %
Basophils Absolute: 74 cells/uL (ref 0–200)
EOS ABS: 222 {cells}/uL (ref 15–500)
EOS PCT: 3 %
HCT: 40.5 % (ref 35.0–45.0)
Hemoglobin: 13.3 g/dL (ref 11.7–15.5)
LYMPHS PCT: 17 %
Lymphs Abs: 1258 cells/uL (ref 850–3900)
MCH: 28.1 pg (ref 27.0–33.0)
MCHC: 32.8 g/dL (ref 32.0–36.0)
MCV: 85.4 fL (ref 80.0–100.0)
MONOS PCT: 10 %
MPV: 9.8 fL (ref 7.5–12.5)
Monocytes Absolute: 740 cells/uL (ref 200–950)
NEUTROS ABS: 5106 {cells}/uL (ref 1500–7800)
Neutrophils Relative %: 69 %
PLATELETS: 190 10*3/uL (ref 140–400)
RBC: 4.74 MIL/uL (ref 3.80–5.10)
RDW: 14.3 % (ref 11.0–15.0)
WBC: 7.4 10*3/uL (ref 3.8–10.8)

## 2016-12-09 NOTE — Progress Notes (Signed)
Rheumatology Medication Review by a Pharmacist Does the patient feel that his/her medications are working for him/her?  Yes Has the patient been experiencing any side effects to the medications prescribed?  No Does the patient have any problems obtaining medications?  No  Issues to address at subsequent visits: Patient will be switching to Medicare. She is concerned about coverage of Enbrel while on medicare.   Pharmacist comments:  Tiffany Pollard is a pleasant 66 yo F who presents for follow up of her rheumatoid arthritis.  She is currently taking Enbrel 50 mg weekly.  Her most recent standing labs were on 09/17/16.  CBC was normal and CMP showed a BUN/Cr ratio of 29.  She will be due for standing labs again this month.  Most recent TB Gold was on 03/23/16 which was negative.  She will be due for TB Gold again in April 2018.  Patient reports she is going to be switching to [facility] in October 2018.  Discussed use of grant foundations for co-pay assistance or manufacture patient assistance programs.  Patient reports she does not think she would qualify for either based on income.  Provided patient with information on the [employer] and advised her to contact them regarding questions about medicare.  Patient denies any questions regarding her medications.     [name], Pharm.D., BCPS Clinical Pharmacist Pager: [phone] Phone: [phone] 12/09/2016 8:50 AM

## 2016-12-10 ENCOUNTER — Ambulatory Visit: Payer: Self-pay | Admitting: Rheumatology

## 2016-12-10 LAB — COMPLETE METABOLIC PANEL WITH GFR
ALT: 23 U/L (ref 6–29)
AST: 28 U/L (ref 10–35)
Albumin: 4.2 g/dL (ref 3.6–5.1)
Alkaline Phosphatase: 80 U/L (ref 33–130)
BUN: 22 mg/dL (ref 7–25)
CHLORIDE: 105 mmol/L (ref 98–110)
CO2: 26 mmol/L (ref 20–31)
CREATININE: 0.78 mg/dL (ref 0.50–0.99)
Calcium: 9.4 mg/dL (ref 8.6–10.4)
GFR, EST NON AFRICAN AMERICAN: 81 mL/min (ref >=60)
GLUCOSE: 80 mg/dL (ref 65–99)
Potassium: 4.3 mmol/L (ref 3.5–5.3)
SODIUM: 142 mmol/L (ref 135–146)
TOTAL PROTEIN: 6.6 g/dL (ref 6.1–8.1)
Total Bilirubin: 0.4 mg/dL (ref 0.2–1.2)

## 2016-12-10 NOTE — Progress Notes (Signed)
I think our descent this to you but just in case I did not am sending it to you once again. Sorry from sending a duplicate

## 2017-01-05 ENCOUNTER — Telehealth: Payer: Self-pay | Admitting: Radiology

## 2017-01-05 MED ORDER — ETANERCEPT 50 MG/ML ~~LOC~~ SOAJ: 50.0000 mg | SUBCUTANEOUS | 0 refills | Status: DC

## 2017-01-05 NOTE — Telephone Encounter (Signed)
Refill request received via fax for Enbrel from [facility]

## 2017-01-05 NOTE — Telephone Encounter (Signed)
Mr Carlyon Shadow approved this to be sent at office visit earlier this month, unfortunately did not get sent Have resent today see his notes for detail

## 2017-03-15 LAB — CBC WITH DIFFERENTIAL/PLATELET
BASOS ABS: 0 10*3/uL (ref 0.0–0.2)
Basos: 1 %
EOS (ABSOLUTE): 0.5 10*3/uL — ABNORMAL HIGH (ref 0.0–0.4)
EOS: 8 %
HEMATOCRIT: 37.1 % (ref 34.0–46.6)
HEMOGLOBIN: 12.4 g/dL (ref 11.1–15.9)
IMMATURE GRANULOCYTES: 0 %
Immature Grans (Abs): 0 10*3/uL (ref 0.0–0.1)
LYMPHS ABS: 1.6 10*3/uL (ref 0.7–3.1)
Lymphs: 27 %
MCH: 28.2 pg (ref 26.6–33.0)
MCHC: 33.4 g/dL (ref 31.5–35.7)
MCV: 85 fL (ref 79–97)
MONOCYTES: 9 %
Monocytes Absolute: 0.5 10*3/uL (ref 0.1–0.9)
NEUTROS PCT: 55 %
Neutrophils Absolute: 3.2 10*3/uL (ref 1.4–7.0)
Platelets: 155 10*3/uL (ref 150–379)
RBC: 4.39 x10E6/uL (ref 3.77–5.28)
RDW: 14.5 % (ref 12.3–15.4)
WBC: 5.9 10*3/uL (ref 3.4–10.8)

## 2017-03-15 LAB — CMP14+EGFR
ALBUMIN: 4 g/dL (ref 3.6–4.8)
ALK PHOS: 73 IU/L (ref 39–117)
ALT: 23 IU/L (ref 0–32)
AST: 25 IU/L (ref 0–40)
Albumin/Globulin Ratio: 1.7 (ref 1.2–2.2)
BUN/Creatinine Ratio: 30 — ABNORMAL HIGH (ref 12–28)
BUN: 22 mg/dL (ref 8–27)
Bilirubin Total: 0.3 mg/dL (ref 0.0–1.2)
CALCIUM: 9.1 mg/dL (ref 8.7–10.3)
CO2: 26 mmol/L (ref 18–29)
CREATININE: 0.74 mg/dL (ref 0.57–1.00)
Chloride: 100 mmol/L (ref 96–106)
GFR calc Af Amer: 99 mL/min/{1.73_m2} (ref >59)
GFR, EST NON AFRICAN AMERICAN: 86 mL/min/{1.73_m2} (ref >59)
GLOBULIN, TOTAL: 2.3 g/dL (ref 1.5–4.5)
GLUCOSE: 86 mg/dL (ref 65–99)
Potassium: 4.1 mmol/L (ref 3.5–5.2)
Sodium: 140 mmol/L (ref 134–144)
Total Protein: 6.3 g/dL (ref 6.0–8.5)

## 2017-03-15 LAB — QUANTIFERON TB GOLD ASSAY (BLOOD)

## 2017-03-15 LAB — QUANTIFERON IN TUBE
QFT TB AG MINUS NIL VALUE: 0 IU/mL
QUANTIFERON MITOGEN VALUE: >10 IU/mL
QUANTIFERON TB AG VALUE: 0.02 IU/mL
QUANTIFERON TB GOLD: NEGATIVE
Quantiferon Nil Value: 0.02 IU/mL

## 2017-03-29 ENCOUNTER — Other Ambulatory Visit (HOSPITAL_COMMUNITY): Payer: Self-pay | Admitting: Unknown Physician Specialty

## 2017-03-29 DIAGNOSIS — Z1231 Encounter for screening mammogram for malignant neoplasm of breast: Secondary | ICD-10-CM

## 2017-04-14 ENCOUNTER — Other Ambulatory Visit: Payer: Self-pay | Admitting: *Deleted

## 2017-04-14 MED ORDER — ETANERCEPT 50 MG/ML ~~LOC~~ SOAJ: 50.0000 mg | SUBCUTANEOUS | 0 refills | Status: DC

## 2017-04-14 NOTE — Telephone Encounter (Signed)
Refill request received via fax  Last Visit: 12/09/16 Next Visit: 05/12/17 Labs: 03/11/17 CBC WNL, CMP with GFR is within normal limits except for BUN/creatinine ratio was mildly elevated TB Gold: 03/11/17 Neg  Okay to refill Enbrel?

## 2017-04-30 DIAGNOSIS — Z8639 Personal history of other endocrine, nutritional and metabolic disease: Secondary | ICD-10-CM | POA: Insufficient documentation

## 2017-04-30 DIAGNOSIS — Z87898 Personal history of other specified conditions: Secondary | ICD-10-CM | POA: Insufficient documentation

## 2017-04-30 DIAGNOSIS — M170 Bilateral primary osteoarthritis of knee: Secondary | ICD-10-CM | POA: Insufficient documentation

## 2017-04-30 DIAGNOSIS — Z87442 Personal history of urinary calculi: Secondary | ICD-10-CM | POA: Insufficient documentation

## 2017-04-30 DIAGNOSIS — Z8719 Personal history of other diseases of the digestive system: Secondary | ICD-10-CM | POA: Insufficient documentation

## 2017-04-30 DIAGNOSIS — Z8669 Personal history of other diseases of the nervous system and sense organs: Secondary | ICD-10-CM | POA: Insufficient documentation

## 2017-04-30 DIAGNOSIS — Z862 Personal history of diseases of the blood and blood-forming organs and certain disorders involving the immune mechanism: Secondary | ICD-10-CM | POA: Insufficient documentation

## 2017-04-30 NOTE — Progress Notes (Signed)
Office Visit Note  Patient: Tiffany Pollard             Date of Birth: 09-07-1951           MRN: [id number]             PCP: Monico Blitz, MD Referring: Monico Blitz, MD Visit Date: 05/12/2017 Occupation: @GUAROCC @    Subjective:  Sinus allergies, no joint pain.   History of Present Illness: Tiffany Pollard is a 66 y.o. female with history of rheumatoid arthritis some and osteoarthritis. She states she's been fighting sinus allergies and sinus infection. She does not have any joint pain or joint swelling currently. She believes arthritis is very well controlled. She continues to have some lower back pain. She has occasional discomfort in her knee joints.  Activities of Daily Living:  Patient reports morning stiffness for 5 minutes.   Patient Denies nocturnal pain.  Difficulty dressing/grooming: Denies Difficulty climbing stairs: Denies Difficulty getting out of chair: Denies Difficulty using hands for taps, buttons, cutlery, and/or writing: Denies   Review of Systems  Constitutional: Positive for fatigue. Negative for night sweats, weight gain, weight loss and weakness.  HENT: Negative for mouth sores, trouble swallowing, trouble swallowing, mouth dryness and nose dryness.   Eyes: Negative for pain, redness, visual disturbance and dryness.  Respiratory: Negative for cough, shortness of breath and difficulty breathing.   Cardiovascular: Negative for chest pain, palpitations, hypertension, irregular heartbeat and swelling in legs/feet.  Gastrointestinal: Negative for blood in stool, constipation and diarrhea.  Endocrine: Negative for increased urination.  Genitourinary: Negative for vaginal dryness.  Musculoskeletal: Positive for morning stiffness. Negative for arthralgias, joint pain, joint swelling, myalgias, muscle weakness, muscle tenderness and myalgias.  Skin: Negative for color change, rash, hair loss, skin tightness, ulcers and sensitivity to sunlight.    Allergic/Immunologic: Negative for susceptible to infections.  Neurological: Negative for dizziness, memory loss and night sweats.  Hematological: Negative for swollen glands.  Psychiatric/Behavioral: Negative for depressed mood and sleep disturbance. The patient is nervous/anxious.     PMFS History:  Patient Active Problem List   Diagnosis Date Noted  . Primary osteoarthritis of both knees 04/30/2017  . History of anemia 04/30/2017  . History of migraine 04/30/2017  . History of vertigo 04/30/2017  . History of gastroesophageal reflux (GERD) 04/30/2017  . History of kidney stones 04/30/2017  . History of IBS 04/30/2017  . History of hyperlipidemia 04/30/2017  . Hypercalcemia 04/30/2017  . Rheumatoid arthritis involving multiple sites with positive rheumatoid factor ([place]) 12/08/2016  . High risk medication use 12/08/2016  . Primary osteoarthritis of both hands 12/08/2016  . Primary osteoarthritis of both feet 12/08/2016  . Osteoarthritis of lumbar spine 12/08/2016  . Former smoker 12/08/2016  . Abdominal pain, chronic, right lower quadrant 12/11/2013    Past Medical History:  Diagnosis Date  . GERD (gastroesophageal reflux disease)   . High blood pressure   . Rheumatoid arthritis(714.0)     No family history on file. Past Surgical History:  Procedure Laterality Date  . BACK SURGERY    . CERVICAL CONIZATION W/BX    . COLONOSCOPY N/A 12/27/2013   Procedure: COLONOSCOPY;  Surgeon: Rogene Houston, MD;  Location: [facility];  Service: Endoscopy;  Laterality: N/A;  320-moved to 125 Ann to notify pt  . foot surgery for a bunion    . NASAL SINUS SURGERY    . TONSILLECTOMY     Social History   Social History Narrative  .  No narrative on file     Objective: Vital Signs: BP 126/74   Pulse 74   Resp 16   Ht 5' 5.5" (1.664 m)   Wt 162 lb (73.5 kg)   BMI 26.55 kg/m    Physical Exam  Constitutional: She is oriented to person, place, and time. She appears  well-developed and well-nourished.  HENT:  Head: Normocephalic and atraumatic.  Eyes: Conjunctivae and EOM are normal.  Neck: Normal range of motion.  Cardiovascular: Normal rate, regular rhythm, normal heart sounds and intact distal pulses.   Pulmonary/Chest: Effort normal and breath sounds normal.  Abdominal: Soft. Bowel sounds are normal.  Lymphadenopathy:    She has no cervical adenopathy.  Neurological: She is alert and oriented to person, place, and time.  Skin: Skin is warm and dry. Capillary refill takes less than 2 seconds. Rash noted.  rosacea  Psychiatric: She has a normal mood and affect. Her behavior is normal.  Nursing note and vitals reviewed.    Musculoskeletal Exam: Thoracic kyphosis. Shoulder joints although joints wrist joint MCPs PIPs DIPs are good range of motion she has PIP/DIP thickening but no synovitis hip joints knee joints ankles MTPs PIPs are good range of motion with no synovitis.  CDAI Exam: CDAI Homunculus Exam:   Joint Counts:  CDAI Tender Joint count: 0 CDAI Swollen Joint count: 0  Global Assessments:  Patient Global Assessment: 2 Provider Global Assessment: 2  CDAI Calculated Score: 4    Investigation: Findings:  06/05/2015 After informed consent was obtained per EULAR recommendation, ultrasound examination of the bilateral hands was performed.  Using 12 MHz transducer, grayscale and power Doppler, bilateral 2nd, 3rd and 5th MCP joints and bilateral wrist joints, both dorsal and volar aspects were evaluated to look for synovitis or tenosynovitis.  The findings were she had mild synovitis in her bilateral 2nd MCP joint, right 5th MCP joint, left 3rd MCP joint and very mild synovitis in her wrist joint mostly on the radial aspect.  Bilateral median nerves were within normal limits at 0.7 cm square.   IMPRESSION AND PLAN:  These findings are consistent with rheumatoid arthritis.  She has some synovial thickening and mild synovitis; although I  believe that her synovitis is not active enough to change the treatment plan.  We will continue the current treatment plan and see her back at followup visit in the next few months.  04/10/2015 x-ray of bilateral hands, 2 views in the office today, showed bilateral PIP and CMC narrowing.  Some radiocarpal joint space narrowing.  No erosive changes.  Bilateral 1st and 2nd MCP soft tissue swelling consistent with rheumatoid arthritis.    Bilateral feet x-rays, 2 views, showed minimal PIP narrowing, osteopenia, and bilateral 5th soft tissue swelling, consistent with inflammatory arthritis.   Bilateral knee joint x-rays, 2 views, showed bilateral moderate medial compartment narrowing without chondrocalcinosis.  Moderate patellofemoral narrowing consistent with moderate osteoarthritis and chondromalacia of the patella.  Labs from 04/10/2015 show CMP is normal.  Sed rate is normal.  Acute hepatitis panel is negative.  TB Gold is negative.  CBC is normal.  Uric acid is normal.  CK is normal.  TSH is normal.  HIV is normal.  Immunoglobulin G, A and M are normal.  SPEP is normal.     Rheumatoid factor is positive at 24.  ANA positive with a negative titer and CCP positive at greater than 300.    03/11/2017 negative TB gold    CBC Latest Ref Rng &  Units 03/11/2017 12/09/2016 06/18/2009  WBC 3.4 - 10.8 x10E3/uL 5.9 7.4 3.3(L)  Hemoglobin 11.1 - 15.9 g/dL 12.4 13.3 11.8(L)  Hematocrit 34.0 - 46.6 % 37.1 40.5 34.2(L)  Platelets 150 - 379 x10E3/uL 155 190 136(L)   CMP Latest Ref Rng & Units 03/11/2017 12/09/2016 09/28/2016  Glucose 65 - 99 mg/dL 86 80 -  BUN 8 - 27 mg/dL 22 22 -  Creatinine 0.57 - 1.00 mg/dL 0.74 0.78 0.80  Sodium 134 - 144 mmol/L 140 142 -  Potassium 3.5 - 5.2 mmol/L 4.1 4.3 -  Chloride 96 - 106 mmol/L 100 105 -  CO2 18 - 29 mmol/L 26 26 -  Calcium 8.7 - 10.3 mg/dL 9.1 9.4 -  Total Protein 6.0 - 8.5 g/dL 6.3 6.6 -  Total Bilirubin 0.0 - 1.2 mg/dL 0.3 0.4 -  Alkaline Phos 39 - 117 IU/L 73 80 -    AST 0 - 40 IU/L 25 28 -  ALT 0 - 32 IU/L 23 23 -     Imaging: No results found.  Speciality Comments: No specialty comments available.    Procedures:  No procedures performed Allergies: Patient has no known allergies.   Assessment / Plan:     Visit Diagnoses: Rheumatoid arthritis involving multiple sites with positive rheumatoid factor (HCC) - +RF +CCP-ANA. She had no synovitis on examination today.   High risk medication use - Enbrel 50 mg subcutaneous every week. Her labs have been stable. She will be changing to [facility] in October. We discussed different treatment options and she is more inclined towards IV Orencia  Primary osteoarthritis of both hands: She continues to have some stiffness and discomfort in her hands.  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 CBC with Differential/Platelet  . COMPLETE METABOLIC PANEL WITH GFR   No orders of the defined types were placed in this encounter.   Face-to-face time spent with patient was 30 minutes. 50% of time was spent in counseling and coordination of care.  Follow-Up Instructions: Return in about 4 months (around 09/11/2017) for RA, OA, DDD.   Bo Merino, MD  Note - This record has been created using Editor, commissioning.  Chart creation errors have been sought, but may not always  have been located. Such creation errors do not reflect on  the standard of medical care.

## 2017-05-12 ENCOUNTER — Encounter: Payer: Self-pay | Admitting: Rheumatology

## 2017-05-12 ENCOUNTER — Ambulatory Visit (INDEPENDENT_AMBULATORY_CARE_PROVIDER_SITE_OTHER): Payer: 59 | Admitting: Rheumatology

## 2017-05-12 ENCOUNTER — Telehealth: Payer: Self-pay | Admitting: Radiology

## 2017-05-12 ENCOUNTER — Telehealth: Payer: Self-pay

## 2017-05-12 VITALS — BP 126/74 | HR 74 | Resp 16 | Ht 65.5 in | Wt 162.0 lb

## 2017-05-12 DIAGNOSIS — M19072 Primary osteoarthritis, left ankle and foot: Secondary | ICD-10-CM

## 2017-05-12 DIAGNOSIS — Z79899 Other long term (current) drug therapy: Secondary | ICD-10-CM

## 2017-05-12 DIAGNOSIS — Z862 Personal history of diseases of the blood and blood-forming organs and certain disorders involving the immune mechanism: Secondary | ICD-10-CM

## 2017-05-12 DIAGNOSIS — M170 Bilateral primary osteoarthritis of knee: Secondary | ICD-10-CM

## 2017-05-12 DIAGNOSIS — Z87898 Personal history of other specified conditions: Secondary | ICD-10-CM | POA: Diagnosis not present

## 2017-05-12 DIAGNOSIS — M19042 Primary osteoarthritis, left hand: Secondary | ICD-10-CM

## 2017-05-12 DIAGNOSIS — M0579 Rheumatoid arthritis with rheumatoid factor of multiple sites without organ or systems involvement: Secondary | ICD-10-CM

## 2017-05-12 DIAGNOSIS — Z8669 Personal history of other diseases of the nervous system and sense organs: Secondary | ICD-10-CM | POA: Diagnosis not present

## 2017-05-12 DIAGNOSIS — M19041 Primary osteoarthritis, right hand: Secondary | ICD-10-CM

## 2017-05-12 DIAGNOSIS — Z8719 Personal history of other diseases of the digestive system: Secondary | ICD-10-CM | POA: Diagnosis not present

## 2017-05-12 DIAGNOSIS — Z87442 Personal history of urinary calculi: Secondary | ICD-10-CM

## 2017-05-12 DIAGNOSIS — Z87891 Personal history of nicotine dependence: Secondary | ICD-10-CM | POA: Diagnosis not present

## 2017-05-12 DIAGNOSIS — M47816 Spondylosis without myelopathy or radiculopathy, lumbar region: Secondary | ICD-10-CM

## 2017-05-12 DIAGNOSIS — M19071 Primary osteoarthritis, right ankle and foot: Secondary | ICD-10-CM | POA: Diagnosis not present

## 2017-05-12 DIAGNOSIS — Z8639 Personal history of other endocrine, nutritional and metabolic disease: Secondary | ICD-10-CM

## 2017-05-12 NOTE — Telephone Encounter (Signed)
A prior authorization for Enbrel was submitted through Cover My Meds. Will update once we receive a response.   Aaylah Pokorny, [place], CPhT  2:40 PM

## 2017-05-12 NOTE — Patient Instructions (Signed)
Standing Labs We placed an order today for your standing lab work.    Please come back and get your standing labs in July 2018 and every 3 months  We have open lab Monday through Friday from 8:30-11:30 AM and 1:30-4 PM at the office of Dr. Tresa Moore, PA.   The office is located at [address], [place], [place], [place] [address] No appointment is necessary.   Labs are drawn by [facility].  You may receive a bill from [facility] for your lab work. If you have any questions regarding directions or hours of operation,  please call [phone].

## 2017-05-12 NOTE — Progress Notes (Signed)
Rheumatology Medication Review by a Pharmacist Does the patient feel that his/her medications are working for him/her?  Yes Has the patient been experiencing any side effects to the medications prescribed?  No Does the patient have any problems obtaining medications?  No  Issues to address at subsequent visits: Patient is switching to Medicare in October 2018   Pharmacist comments:  Tiffany Pollard is a pleasant 66 yo F who presents for follow up of rheumatoid arthritis.  She is currently taking Enbrel 50 mg weekly.  She had TB Gold, CBC, and CMP on 03/11/17 which were normal.  She will be due for standing labs again in July and every 3 months.  TB Gold will be due again in April 2019.  Discussed patient transitioning to [employer].  She reports she would not qualify for the Enbrel patient assistance program based on her income.  She plans to sign up for a medicare supplement plan.  Patient may benefit from infusion therapy (Simponi Aria or Orencia).  Will plan to follow up with patient in October to determine best treatment option with her new insurance.    [name], Pharm.D., BCPS, CPP Clinical Pharmacist Pager: [phone] Phone: [phone] 05/12/2017 11:26 AM

## 2017-05-12 NOTE — Telephone Encounter (Signed)
Patient was in the office today and states the prior auth for Enbrel is due this month

## 2017-05-13 ENCOUNTER — Ambulatory Visit (HOSPITAL_COMMUNITY)
Admission: RE | Admit: 2017-05-13 | Discharge: 2017-05-13 | Disposition: A | Discharge status: Home / Self Care | Payer: 59 | Source: Ambulatory Visit | Attending: Unknown Physician Specialty | Admitting: Unknown Physician Specialty

## 2017-05-13 DIAGNOSIS — Z1231 Encounter for screening mammogram for malignant neoplasm of breast: Secondary | ICD-10-CM | POA: Diagnosis not present

## 2017-05-18 NOTE — Telephone Encounter (Signed)
Received a fax from [facility] stating that the prior authorization for Enbrel has been canceled because it too soon to be filled. The pharmacy should process the refill on or after 05/18/17. Will send document to scan center.  PA: [id number] Phone: [phone]  Spoke with Tiffany Pollard, a representative from the specialty pharmacy who states that the claim is going though without any issues. Patient will be due for a refill next week.   After verifying the previous authorization, the medication has been approved through 05/07/18. Spoke to patient and updated her on the results. Patient voiced understanding and denied any questions at this time.  Tiffany Pollard, [place], CPhT

## 2017-06-10 ENCOUNTER — Other Ambulatory Visit: Payer: Self-pay | Admitting: Rheumatology

## 2017-06-11 LAB — CBC WITH DIFFERENTIAL/PLATELET
BASOS: 1 %
Basophils Absolute: 0 10*3/uL (ref 0.0–0.2)
EOS (ABSOLUTE): 0.4 10*3/uL (ref 0.0–0.4)
EOS: 7 %
HEMATOCRIT: 38.8 % (ref 34.0–46.6)
Hemoglobin: 13 g/dL (ref 11.1–15.9)
IMMATURE GRANULOCYTES: 0 %
Immature Grans (Abs): 0 10*3/uL (ref 0.0–0.1)
LYMPHS ABS: 1.4 10*3/uL (ref 0.7–3.1)
Lymphs: 26 %
MCH: 28.6 pg (ref 26.6–33.0)
MCHC: 33.5 g/dL (ref 31.5–35.7)
MCV: 86 fL (ref 79–97)
MONOS ABS: 0.7 10*3/uL (ref 0.1–0.9)
Monocytes: 13 %
NEUTROS ABS: 2.8 10*3/uL (ref 1.4–7.0)
Neutrophils: 53 %
Platelets: 176 10*3/uL (ref 150–379)
RBC: 4.54 x10E6/uL (ref 3.77–5.28)
RDW: 14.2 % (ref 12.3–15.4)
WBC: 5.2 10*3/uL (ref 3.4–10.8)

## 2017-06-11 LAB — COMPREHENSIVE METABOLIC PANEL
A/G RATIO: 1.9 (ref 1.2–2.2)
ALT: 30 IU/L (ref 0–32)
AST: 36 IU/L (ref 0–40)
Albumin: 4.1 g/dL (ref 3.6–4.8)
Alkaline Phosphatase: 84 IU/L (ref 39–117)
BILIRUBIN TOTAL: 0.2 mg/dL (ref 0.0–1.2)
BUN/Creatinine Ratio: 24 (ref 12–28)
BUN: 18 mg/dL (ref 8–27)
CHLORIDE: 99 mmol/L (ref 96–106)
CO2: 26 mmol/L (ref 20–29)
Calcium: 9.4 mg/dL (ref 8.7–10.3)
Creatinine, Ser: 0.75 mg/dL (ref 0.57–1.00)
GFR calc Af Amer: 97 mL/min/{1.73_m2} (ref >59)
GFR, EST NON AFRICAN AMERICAN: 85 mL/min/{1.73_m2} (ref >59)
GLOBULIN, TOTAL: 2.2 g/dL (ref 1.5–4.5)
Glucose: 83 mg/dL (ref 65–99)
POTASSIUM: 4.1 mmol/L (ref 3.5–5.2)
SODIUM: 138 mmol/L (ref 134–144)
Total Protein: 6.3 g/dL (ref 6.0–8.5)

## 2017-06-16 NOTE — Progress Notes (Signed)
Within normal limits

## 2017-07-17 ENCOUNTER — Other Ambulatory Visit: Payer: Self-pay | Admitting: Rheumatology

## 2017-07-19 NOTE — Telephone Encounter (Signed)
Last Visit: 05/12/17 Next Visit: 09/13/17 Labs: 06/10/17 WNL TB Gold: 03/11/17 Neg   Okay to refill per Dr. Estanislado Pandy

## 2017-09-06 NOTE — Progress Notes (Signed)
Office Visit Note  Patient: Tiffany Pollard             Date of Birth: 07/02/1951           MRN: [id number]             PCP: Monico Blitz, MD Referring: Monico Blitz, MD Visit Date: 09/13/2017 Occupation: @GUAROCC @    Subjective:  Rheumatoid Arthritis (Doing good, needs to discuss meds due to insurance)   History of Present Illness: Tiffany Pollard is a 66 y.o. female with history of sero positive rheumatoid arthritis. She has done really well on Enbrel 50 mg subcutaneous every week. She states she has upper respiratory tract infection currently and is taking Augmentin. Due to that she has a skipped a dose of Enbrel. She'll be switching to Medicare and is interested in IV therapy. We had detailed discussion regarding IV Orencia during the last visit. She wants to proceed with that. She fractured her left third finger in June. She had to happen placement. She still has limited extension of the finger. She continues to have some stiffness in her joints due to underlying osteoarthritis. She does not have much discomfort in her lower back from underlying disc disease currently.   Activities of Daily Living:  Patient reports morning stiffness for 1.5 hours.   Patient Denies nocturnal pain.  Difficulty dressing/grooming: Denies Difficulty climbing stairs: Denies Difficulty getting out of chair: Reports Difficulty using hands for taps, buttons, cutlery, and/or writing: Denies   Review of Systems  Constitutional: Positive for fatigue. Negative for night sweats and weight gain.  HENT: Positive for mouth dryness.   Eyes: Positive for dryness.  Respiratory: Negative for shortness of breath.   Cardiovascular: Negative for swelling in legs/feet.  Gastrointestinal: Positive for constipation.  Endocrine: Negative for heat intolerance, excessive thirst and increased urination.  Genitourinary: Negative for painful urination.  Musculoskeletal: Positive for arthralgias, joint pain and morning  stiffness. Negative for gait problem, joint swelling, muscle weakness and muscle tenderness.  Skin: Negative for hair loss and redness.  Neurological: Negative for dizziness, numbness and night sweats.  Hematological: Negative for bruising/bleeding tendency.  Psychiatric/Behavioral: Negative for sleep disturbance.    PMFS History:  Patient Active Problem List   Diagnosis Date Noted  . Primary osteoarthritis of both knees 04/30/2017  . History of anemia 04/30/2017  . History of migraine 04/30/2017  . History of vertigo 04/30/2017  . History of gastroesophageal reflux (GERD) 04/30/2017  . History of kidney stones 04/30/2017  . History of IBS 04/30/2017  . History of hyperlipidemia 04/30/2017  . Hypercalcemia 04/30/2017  . Rheumatoid arthritis involving multiple sites with positive rheumatoid factor ([place]) 12/08/2016  . High risk medication use 12/08/2016  . Primary osteoarthritis of both hands 12/08/2016  . Primary osteoarthritis of both feet 12/08/2016  . Osteoarthritis of lumbar spine 12/08/2016  . Former smoker 12/08/2016  . Abdominal pain, chronic, right lower quadrant 12/11/2013    Past Medical History:  Diagnosis Date  . GERD (gastroesophageal reflux disease)   . High blood pressure   . Rheumatoid arthritis(714.0)     History reviewed. No pertinent family history. Past Surgical History:  Procedure Laterality Date  . BACK SURGERY    . CERVICAL CONIZATION W/BX    . COLONOSCOPY N/A 12/27/2013   Procedure: COLONOSCOPY;  Surgeon: Rogene Houston, MD;  Location: [facility];  Service: Endoscopy;  Laterality: N/A;  320-moved to 125 Ann to notify pt  . FINGER FRACTURE SURGERY    .  foot surgery for a bunion    . NASAL SINUS SURGERY    . TONSILLECTOMY     Social History   Social History Narrative  . No narrative on file     Objective: Vital Signs: BP (!) 128/54 (BP Location: Left Arm, Patient Position: Sitting, Cuff Size: Normal)   Pulse 68   Resp 15   Ht 5' 5.5"  (1.664 m)   Wt 167 lb (75.8 kg)   BMI 27.37 kg/m    Physical Exam  Constitutional: She is oriented to person, place, and time. She appears well-developed and well-nourished.  HENT:  Head: Normocephalic and atraumatic.  Eyes: Conjunctivae and EOM are normal.  Neck: Normal range of motion.  Cardiovascular: Normal rate, regular rhythm, normal heart sounds and intact distal pulses.   Pulmonary/Chest: Effort normal and breath sounds normal.  Abdominal: Soft. Bowel sounds are normal.  Lymphadenopathy:    She has no cervical adenopathy.  Neurological: She is alert and oriented to person, place, and time.  Skin: Skin is warm and dry. Capillary refill takes less than 2 seconds.  Psychiatric: She has a normal mood and affect. Her behavior is normal.  Nursing note and vitals reviewed.    Musculoskeletal Exam: C-spine and thoracic lumbar spine good range of motion. Shoulder joints although joints wrist joint MCPs PIPs DIPs with good range of motion with no synovitis. Hip joints knee joints ankles MTPs PIPs DIPs with good range of motion with no synovitis.  CDAI Exam: CDAI Homunculus Exam:   Joint Counts:  CDAI Tender Joint count: 0 CDAI Swollen Joint count: 0  Global Assessments:  Patient Global Assessment: 3 Provider Global Assessment: 3  CDAI Calculated Score: 6    Investigation: Findings:  03/11/2017 negative TB gold   CBC Latest Ref Rng & Units 06/10/2017 03/11/2017 12/09/2016  WBC 3.4 - 10.8 x10E3/uL 5.2 5.9 7.4  Hemoglobin 11.1 - 15.9 g/dL 13.0 12.4 13.3  Hematocrit 34.0 - 46.6 % 38.8 37.1 40.5  Platelets 150 - 379 x10E3/uL 176 155 190   CMP Latest Ref Rng & Units 06/10/2017 03/11/2017 12/09/2016  Glucose 65 - 99 mg/dL 83 86 80  BUN 8 - 27 mg/dL 18 22 22   Creatinine 0.57 - 1.00 mg/dL 0.75 0.74 0.78  Sodium 134 - 144 mmol/L 138 140 142  Potassium 3.5 - 5.2 mmol/L 4.1 4.1 4.3  Chloride 96 - 106 mmol/L 99 100 105  CO2 20 - 29 mmol/L 26 26 26   Calcium 8.7 - 10.3 mg/dL 9.4 9.1 9.4    Total Protein 6.0 - 8.5 g/dL 6.3 6.3 6.6  Total Bilirubin 0.0 - 1.2 mg/dL 0.2 0.3 0.4  Alkaline Phos 39 - 117 IU/L 84 73 80  AST 0 - 40 IU/L 36 25 28  ALT 0 - 32 IU/L 30 23 23     Imaging: No results found.  Speciality Comments: No specialty comments available.    Procedures:  No procedures performed Allergies: Patient has no known allergies.   Assessment / Plan:     Visit Diagnoses: Rheumatoid arthritis involving multiple sites with positive rheumatoid factor (HCC) - +RF +CCP Ab, -ANA.Patient has no synovitis on an [place] she's been doing extremely well. Due to her insurance change she will have to switch to a IV medication. We will review her insurance and see which medication will be covered. I had brief discussion with her regarding IV Simponi Aria and IV Orencia. She will review the side effects of both medications. We will apply through her insurance.  In the meantime she'll continue Enbrel for right now.  High risk medication use - Enbrel 50 mg sq q wk. we discussed option of IV Orencia if Medicare does not cover Enbrel. I will check her labs today and then every 3 months to monitor for drug toxicity.  Primary osteoarthritis of both hands: She has some chronic stiffness  Primary osteoarthritis of both knees: Chronic pain  Primary osteoarthritis of both feet: She's been using proper fitting shoes which is been helpful.  Spondylosis of lumbar region without myelopathy or radiculopathy: She is not having much discomfort.   Her other medical problems are listed as follows: History of vertigo  History of migraine  History of kidney stones  History of IBS  History of hyperlipidemia  History of gastroesophageal reflux (GERD)  History of anemia  Former smoker    Orders: No orders of the defined types were placed in this encounter.  No orders of the defined types were placed in this encounter.   Face-to-face time spent with patient was 30 minutes.  greater than50%  of time was spent in counseling and coordination of care.  Follow-Up Instructions: Return in about 4 months (around 01/14/2018) for Rheumatoid arthritis, Osteoarthritis.   Bo Merino, MD  Note - This record has been created using Editor, commissioning.  Chart creation errors have been sought, but may not always  have been located. Such creation errors do not reflect on  the standard of medical care.

## 2017-09-09 DIAGNOSIS — M810 Age-related osteoporosis without current pathological fracture: Secondary | ICD-10-CM | POA: Diagnosis not present

## 2017-09-09 DIAGNOSIS — Z87891 Personal history of nicotine dependence: Secondary | ICD-10-CM | POA: Diagnosis not present

## 2017-09-09 DIAGNOSIS — J329 Chronic sinusitis, unspecified: Secondary | ICD-10-CM | POA: Diagnosis not present

## 2017-09-09 DIAGNOSIS — S62623D Displaced fracture of medial phalanx of left middle finger, subsequent encounter for fracture with routine healing: Secondary | ICD-10-CM | POA: Diagnosis not present

## 2017-09-09 DIAGNOSIS — Z299 Encounter for prophylactic measures, unspecified: Secondary | ICD-10-CM | POA: Diagnosis not present

## 2017-09-09 DIAGNOSIS — F419 Anxiety disorder, unspecified: Secondary | ICD-10-CM | POA: Diagnosis not present

## 2017-09-09 DIAGNOSIS — M069 Rheumatoid arthritis, unspecified: Secondary | ICD-10-CM | POA: Diagnosis not present

## 2017-09-09 DIAGNOSIS — I10 Essential (primary) hypertension: Secondary | ICD-10-CM | POA: Diagnosis not present

## 2017-09-09 DIAGNOSIS — F329 Major depressive disorder, single episode, unspecified: Secondary | ICD-10-CM | POA: Diagnosis not present

## 2017-09-09 DIAGNOSIS — E7800 Pure hypercholesterolemia, unspecified: Secondary | ICD-10-CM | POA: Diagnosis not present

## 2017-09-09 DIAGNOSIS — E663 Overweight: Secondary | ICD-10-CM | POA: Diagnosis not present

## 2017-09-13 ENCOUNTER — Ambulatory Visit (INDEPENDENT_AMBULATORY_CARE_PROVIDER_SITE_OTHER): Payer: Medicare Other | Admitting: Rheumatology

## 2017-09-13 ENCOUNTER — Encounter: Payer: Self-pay | Admitting: Rheumatology

## 2017-09-13 VITALS — BP 128/54 | HR 68 | Resp 15 | Ht 65.5 in | Wt 167.0 lb

## 2017-09-13 DIAGNOSIS — M19072 Primary osteoarthritis, left ankle and foot: Secondary | ICD-10-CM

## 2017-09-13 DIAGNOSIS — Z8639 Personal history of other endocrine, nutritional and metabolic disease: Secondary | ICD-10-CM | POA: Diagnosis not present

## 2017-09-13 DIAGNOSIS — M19042 Primary osteoarthritis, left hand: Secondary | ICD-10-CM

## 2017-09-13 DIAGNOSIS — Z8719 Personal history of other diseases of the digestive system: Secondary | ICD-10-CM | POA: Diagnosis not present

## 2017-09-13 DIAGNOSIS — Z87442 Personal history of urinary calculi: Secondary | ICD-10-CM | POA: Diagnosis not present

## 2017-09-13 DIAGNOSIS — M19041 Primary osteoarthritis, right hand: Secondary | ICD-10-CM | POA: Diagnosis not present

## 2017-09-13 DIAGNOSIS — Z87898 Personal history of other specified conditions: Secondary | ICD-10-CM | POA: Diagnosis not present

## 2017-09-13 DIAGNOSIS — M170 Bilateral primary osteoarthritis of knee: Secondary | ICD-10-CM

## 2017-09-13 DIAGNOSIS — Z87891 Personal history of nicotine dependence: Secondary | ICD-10-CM

## 2017-09-13 DIAGNOSIS — Z79899 Other long term (current) drug therapy: Secondary | ICD-10-CM

## 2017-09-13 DIAGNOSIS — M47816 Spondylosis without myelopathy or radiculopathy, lumbar region: Secondary | ICD-10-CM

## 2017-09-13 DIAGNOSIS — M0579 Rheumatoid arthritis with rheumatoid factor of multiple sites without organ or systems involvement: Secondary | ICD-10-CM

## 2017-09-13 DIAGNOSIS — Z8669 Personal history of other diseases of the nervous system and sense organs: Secondary | ICD-10-CM

## 2017-09-13 DIAGNOSIS — M19071 Primary osteoarthritis, right ankle and foot: Secondary | ICD-10-CM | POA: Diagnosis not present

## 2017-09-13 DIAGNOSIS — Z862 Personal history of diseases of the blood and blood-forming organs and certain disorders involving the immune mechanism: Secondary | ICD-10-CM | POA: Diagnosis not present

## 2017-09-13 NOTE — Patient Instructions (Signed)
Standing Labs We placed an order today for your standing lab work.    Please come back and get your standing labs in January and every 3 months    Abatacept solution for injection (subcutaneous or intravenous use) What is this medicine? ABATACEPT (a ba TA sept) is used to treat moderate to severe active rheumatoid arthritis or psoriatic arthritis in adults. This medicine is also used to treat juvenile idiopathic arthritis. This medicine may be used for other purposes; ask your health care provider or pharmacist if you have questions. COMMON BRAND NAME(S): Orencia What should I tell my health care provider before I take this medicine? They need to know if you have any of these conditions: -are taking other medicines to treat rheumatoid arthritis -COPD -diabetes -infection or history of infections -recently received or scheduled to receive a vaccine -scheduled to have surgery -tuberculosis, a positive skin test for tuberculosis or have recently been in close contact with someone who has tuberculosis -viral hepatitis -an unusual or allergic reaction to abatacept, other medicines, foods, dyes, or preservatives -pregnant or trying to get pregnant -breast-feeding How should I use this medicine? This medicine is for infusion into a vein or for injection under the skin. Infusions are given by a health care professional in a hospital or clinic setting. If you are to give your own medicine at home, you will be taught how to prepare and give this medicine under the skin. Use exactly as directed. Take your medicine at regular intervals. Do not take your medicine more often than directed. It is important that you put your used needles and syringes in a special sharps container. Do not put them in a trash can. If you do not have a sharps container, call your pharmacist or healthcare provider to get one. Talk to your pediatrician regarding the use of this medicine in children. While infusions in a  clinic may be prescribed for children as young as 2 years for selected conditions, precautions do apply. Overdosage: If you think you have taken too much of this medicine contact a poison control center or emergency room at once. NOTE: This medicine is only for you. Do not share this medicine with others. What if I miss a dose? This medicine is used once a week if given by injection under the skin. If you miss a dose, take it as soon as you can. If it is almost time for your next dose, take only that dose. Do not take double or extra doses. If you are to be given an infusion, it is important not to miss your dose. Doses are usually every 4 weeks. Call your doctor or health care professional if you are unable to keep an appointment. What may interact with this medicine? Do not take this medicine with any of the following medications: -adalimumab -anakinra -certolizumab -etanercept -golimumab -infliximab -live virus vaccines -rituximab -tocilizumab This medicine may also interact with the following medications: -vaccines This list may not describe all possible interactions. Give your health care provider a list of all the medicines, herbs, non-prescription drugs, or dietary supplements you use. Also tell them if you smoke, drink alcohol, or use illegal drugs. Some items may interact with your medicine. What should I watch for while using this medicine? Visit your doctor for regular check ups while you are taking this medicine. Tell your doctor or healthcare professional if your symptoms do not start to get better or if they get worse. Call your doctor or health care professional if  you get a cold or other infection while receiving this medicine. Do not treat yourself. This medicine may decrease your body's ability to fight infection. Try to avoid being around people who are sick. What side effects may I notice from receiving this medicine? Side effects that you should report to your doctor or  health care professional as soon as possible: -allergic reactions like skin rash, itching or hives, swelling of the face, lips, or tongue -breathing problems -chest pain -signs of infection - fever or chills, cough, unusual tiredness, pain or trouble passing urine, or warm, red or painful skin Side effects that usually do not require medical attention (report to your doctor or health care professional if they continue or are bothersome): -dizziness -headache -nausea, vomiting -sore throat -stomach upset This list may not describe all possible side effects. Call your doctor for medical advice about side effects. You may report side effects to FDA at 1-800-FDA-1088. Where should I keep my medicine? Infusions will be given in a hospital or clinic and will not be stored at home. Storage for syringes given under the skin and stored at home: Keep out of the reach of children. Store in a refrigerator between 2 and 8 degrees C (36 and 46 degrees F). Keep this medicine in the original container. Protect from light. Do not freeze. Throw away any unused medicine after the expiration date. NOTE: This sheet is a summary. It may not cover all possible information. If you have questions about this medicine, talk to your doctor, pharmacist, or health care provider.  2018 Elsevier/Gold Standard (2016-06-11 10:07:35) Golimumab injection (subcutaneous or intravenous use) What is this medicine? GOLIMUMAB (goe LIM ue mab) is used to treat rheumatoid arthritis, psoriatic arthritis, and ankylosing spondylitis. It is also used to treat ulcerative colitis. This medicine may be used for other purposes; ask your health care provider or pharmacist if you have questions. COMMON BRAND NAME(S): Simponi, SIMPONI ARIA What should I tell my health care provider before I take this medicine? They need to know if you have any of these conditions: -cancer -diabetes -Guillain-Barre syndrome -heart failure -hepatitis B or  history of hepatitis B infection -immune system problems -infection or history of infections -low blood counts like low white cell, platelet, or red cell counts -multiple sclerosis -recently received or scheduled to receive a vaccine -tuberculosis, a positive skin test for tuberculosis or have recently been in close contact with someone who has tuberculosis -an unusual reaction to golimumab, other medicines, latex, rubber, foods, dyes, or preservatives -pregnant or trying to get pregnant -breast-feeding How should I use this medicine? This medicine is for infusion into a vein or for injection under the skin. Infusions are given by a health care professional in a hospital or clinic setting. If you are to give your own medicine at home, you will be taught how to prepare and give this medicine under the skin. Use exactly as directed. Take your medicine at regular intervals. Do not take your medicine more often than directed. It is important that you put your used needles and syringes in a special sharps container. Do not put them in a trash can. If you do not have a sharps container, call your pharmacist or healthcare provider to get one. A special MedGuide will be given to you by the pharmacist with each prescription and refill. Be sure to read this information carefully each time. Talk to your pediatrician regarding the use of this medicine in children. Special care may be needed. Overdosage:  If you think you have taken too much of this medicine contact a poison control center or emergency room at once. NOTE: This medicine is only for you. Do not share this medicine with others. What if I miss a dose? If you give your medicine by injection under the skin: If you miss a dose, take it as soon as you can. If it is almost time for your next dose, take only that dose. Do not take double or extra doses. Call your doctor or health care professional if you are not sure how to handle a missed dose. If you  are to be given an infusion: It is important not to miss your dose. Call your doctor or health care professional if you are unable to keep an appointment. What may interact with this medicine? Do not take this medicine with any of the following medications: -abatacept -adalimumab -anakinra -certolizumab -etanercept -infliximab -live virus vaccines -rituximab -rilonacept -tocilizumab This medicine may also interact with the following medications: -cyclosporine -theophylline -tofacitinib -vaccines -warfarin This list may not describe all possible interactions. Give your health care provider a list of all the medicines, herbs, non-prescription drugs, or dietary supplements you use. Also tell them if you smoke, drink alcohol, or use illegal drugs. Some items may interact with your medicine. What should I watch for while using this medicine? Visit your doctor or health care professional for regular checks on your progress. Tell your doctor or healthcare professional if your symptoms do not start to get better or if they get worse. You will be tested for tuberculosis (TB) before you start this medicine. If your doctor prescribes any medicine for TB, you should start taking the TB medicine before starting this medicine. Make sure to finish the full course of TB medicine. Call your doctor or health care professional if you get a cold or other infection while receiving this medicine. Do not treat yourself. This medicine may decrease your body's ability to fight infection. Talk to your doctor about your risk of cancer. You may be more at risk for certain types of cancers if you take this medicine. What side effects may I notice from receiving this medicine? Side effects that you should report to your doctor or health care professional as soon as possible: -allergic reactions like skin rash, itching or hives, swelling of the face, lips, or tongue -breathing problems -changes in vision -chest  pain -joint or muscle pain -mouth sores -numbness or tingling in any part of your body -red, scaly patches or raised bumps on the skin -signs and symptoms of infection like fever or chills; cough; sore throat; pain or trouble passing urine -signs and symptoms of liver injury like dark yellow or brown urine; general ill feeling or flu-like symptoms; light-colored stools; loss of appetite; nausea; right upper belly pain; unusually weak or tired; yellowing of the eyes or skin -swelling of the legs or ankles -swollen lymph nodes in the neck, underarm, or groin areas -unexplained weight loss -unusual bleeding or bruising -unusually weak or tired Side effects that usually do not require medical attention (report to your doctor or health care professional if they continue or are bothersome): -dizziness -increased blood pressure -nausea -redness, itching, swelling, or bruising at site where injected -runny nose This list may not describe all possible side effects. Call your doctor for medical advice about side effects. You may report side effects to FDA at 1-800-FDA-1088. Where should I keep my medicine? Infusions will be given in a hospital or clinic  and will not be stored at home. Storage for syringes given under the skin and stored at home: Keep out of the reach of children. Store in the original container in a refrigerator between 2 and 8 degrees C (36 and 46 degrees F). Keep this medicine in the original container. Protect from light. Do not freeze. Throw away any unused medicine after the expiration date. NOTE: This sheet is a summary. It may not cover all possible information. If you have questions about this medicine, talk to your doctor, pharmacist, or health care provider.  2018 Elsevier/Gold Standard (2016-10-14 08:42:06)   We have open lab Monday through Friday from 8:30-11:30 AM and 1:30-4 PM at the office of Dr. Bo Merino.   The office is located at [address],  [place], [place], [place] [address] No appointment is necessary.   Labs are drawn by [facility].  You may receive a bill from [facility] for your lab work. If you have any questions regarding directions or hours of operation,  please call [phone].

## 2017-09-14 LAB — CBC WITH DIFFERENTIAL/PLATELET
BASOS ABS: 38 {cells}/uL (ref 0–200)
Basophils Relative: 0.5 %
EOS PCT: 4.2 %
Eosinophils Absolute: 319 cells/uL (ref 15–500)
HCT: 40.9 % (ref 35.0–45.0)
Hemoglobin: 13.8 g/dL (ref 11.7–15.5)
Lymphs Abs: 1535 cells/uL (ref 850–3900)
MCH: 28.2 pg (ref 27.0–33.0)
MCHC: 33.7 g/dL (ref 32.0–36.0)
MCV: 83.5 fL (ref 80.0–100.0)
MONOS PCT: 10.4 %
MPV: 10.2 fL (ref 7.5–12.5)
NEUTROS PCT: 64.7 %
Neutro Abs: 4917 cells/uL (ref 1500–7800)
Platelets: 191 10*3/uL (ref 140–400)
RBC: 4.9 10*6/uL (ref 3.80–5.10)
RDW: 12.9 % (ref 11.0–15.0)
Total Lymphocyte: 20.2 %
WBC mixed population: 790 cells/uL (ref 200–950)
WBC: 7.6 10*3/uL (ref 3.8–10.8)

## 2017-09-14 LAB — COMPLETE METABOLIC PANEL WITH GFR
AG RATIO: 1.7 (calc) (ref 1.0–2.5)
ALKALINE PHOSPHATASE (APISO): 83 U/L (ref 33–130)
ALT: 31 U/L — ABNORMAL HIGH (ref 6–29)
AST: 26 U/L (ref 10–35)
Albumin: 4.4 g/dL (ref 3.6–5.1)
BILIRUBIN TOTAL: 0.4 mg/dL (ref 0.2–1.2)
BUN: 18 mg/dL (ref 7–25)
CHLORIDE: 101 mmol/L (ref 98–110)
CO2: 30 mmol/L (ref 20–32)
Calcium: 10.1 mg/dL (ref 8.6–10.4)
Creat: 0.75 mg/dL (ref 0.50–0.99)
GFR, Est African American: 97 mL/min/{1.73_m2} (ref >=60)
GFR, Est Non African American: 84 mL/min/{1.73_m2} (ref >=60)
GLOBULIN: 2.6 g/dL (ref 1.9–3.7)
Glucose, Bld: 82 mg/dL (ref 65–99)
POTASSIUM: 4.2 mmol/L (ref 3.5–5.3)
SODIUM: 139 mmol/L (ref 135–146)
Total Protein: 7 g/dL (ref 6.1–8.1)

## 2017-09-14 NOTE — Progress Notes (Signed)
Labs are stable. ALT mildly elevated. We'll continue to monitor.

## 2017-09-16 DIAGNOSIS — Z7189 Other specified counseling: Secondary | ICD-10-CM | POA: Diagnosis not present

## 2017-09-16 DIAGNOSIS — Z1339 Encounter for screening examination for other mental health and behavioral disorders: Secondary | ICD-10-CM | POA: Diagnosis not present

## 2017-09-16 DIAGNOSIS — F329 Major depressive disorder, single episode, unspecified: Secondary | ICD-10-CM | POA: Diagnosis not present

## 2017-09-16 DIAGNOSIS — Z6828 Body mass index (BMI) 28.0-28.9, adult: Secondary | ICD-10-CM | POA: Diagnosis not present

## 2017-09-16 DIAGNOSIS — Z1211 Encounter for screening for malignant neoplasm of colon: Secondary | ICD-10-CM | POA: Diagnosis not present

## 2017-09-16 DIAGNOSIS — Z1331 Encounter for screening for depression: Secondary | ICD-10-CM | POA: Diagnosis not present

## 2017-09-16 DIAGNOSIS — Z0000 Encounter for general adult medical examination without abnormal findings: Secondary | ICD-10-CM | POA: Diagnosis not present

## 2017-09-16 DIAGNOSIS — Z299 Encounter for prophylactic measures, unspecified: Secondary | ICD-10-CM | POA: Diagnosis not present

## 2017-09-20 DIAGNOSIS — E559 Vitamin D deficiency, unspecified: Secondary | ICD-10-CM | POA: Diagnosis not present

## 2017-09-20 DIAGNOSIS — R5383 Other fatigue: Secondary | ICD-10-CM | POA: Diagnosis not present

## 2017-09-20 DIAGNOSIS — E7800 Pure hypercholesterolemia, unspecified: Secondary | ICD-10-CM | POA: Diagnosis not present

## 2017-09-20 DIAGNOSIS — Z79899 Other long term (current) drug therapy: Secondary | ICD-10-CM | POA: Diagnosis not present

## 2017-09-20 DIAGNOSIS — F419 Anxiety disorder, unspecified: Secondary | ICD-10-CM | POA: Diagnosis not present

## 2017-09-21 ENCOUNTER — Telehealth (INDEPENDENT_AMBULATORY_CARE_PROVIDER_SITE_OTHER): Payer: Self-pay

## 2017-09-21 ENCOUNTER — Telehealth: Payer: Self-pay | Admitting: *Deleted
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
## 2017-09-21 NOTE — Telephone Encounter (Signed)
Patient called and stated to disregard Rx request for Enbrel from [facility], but if possible she would like some samples for [facility] until Infusions are approved.Thank You.

## 2017-09-22 NOTE — Telephone Encounter (Signed)
ok 

## 2017-09-22 NOTE — Telephone Encounter (Signed)
Patient advised she may come to office to pick up samples. Patient states she has contacted the [employer] for replacement pens.

## 2017-09-22 NOTE — Telephone Encounter (Signed)
Last Visit: 09/23/17 Next Visit: 02/02/18 Labs: 09/13/17 Labs are stable. ALT mildly elevated TB Gold: 03/11/17 Neg  Okay to provide patient with Enbrel samples?

## 2017-09-23 ENCOUNTER — Telehealth: Payer: Self-pay | Admitting: *Deleted

## 2017-09-23 NOTE — Telephone Encounter (Signed)
Medication Samples have been provided to the patient.  Drug name: Enbrel     Strength: 50 mg         Qty: 2  XEN:[id number] & [id number]  Exp.Date: 07/20 & 06/20  Dosing instructions: Inject 1 pen pen SQ weekly  The patient has been instructed regarding the correct time, dose, and frequency of taking this medication, including desired effects and most common side effects.   Gwenlyn Perking 12:15 PM 09/23/2017

## 2017-09-28 ENCOUNTER — Telehealth: Payer: Self-pay

## 2017-09-28 NOTE — Telephone Encounter (Signed)
We can apply for Simponi Aria. Patient has done well on Enbrel. She should be okay to stay within the anti-TNF group.

## 2017-09-28 NOTE — Telephone Encounter (Signed)
Patient can have either Simpni Aria or Orencia per her insurance . Which would you like to start patient on?

## 2017-09-28 NOTE — Telephone Encounter (Signed)
Patient has Medicare (A&B) and AARP Medicare Supplement. Pre-Certification is not required for Medicare. Patient has met her $25 deductible with Medicare. It will cover 80% of the cost of outpatient infusion services and medication. Patient will be responsible for the remaining 20%. Her AARP supplemental plan will not require a pre-certification. It will pay 100% of the cost of the part B deductible and remaining balance. According to the automated systems of both plans, patient should be covered at 100% for outpatient infusion services at [facility].   Gloriajean Okun, [place], CPhT 3:26 PM

## 2017-10-11 ENCOUNTER — Telehealth: Payer: Self-pay

## 2017-10-11 NOTE — Telephone Encounter (Signed)
Patient had left a VM concerning insurance approval for infusions.  CB# is [phone].  Please advise.  Thank you.

## 2017-10-12 NOTE — Telephone Encounter (Signed)
Patient has been advised that infusions should be covered at 100 percent. Patient will come by the office on 10/13/17 to sign consent on Simponi Aria and then infusion Orders can be placed .

## 2017-10-13 ENCOUNTER — Telehealth: Payer: Self-pay | Admitting: *Deleted

## 2017-10-13 NOTE — Telephone Encounter (Signed)
Patient came to the office to sign consent.   Counseled patient that Simponi Donalda Ewings is a TNF blocking agent.  Counseled patient on purpose, proper use, and adverse effects of Simponi.  Reviewed the most common adverse effects including infections and infusion reactions.  Discussed that there is the possibility of an increased risk of malignancy but it is not well understood if this increased risk is due to the medication or the disease state.  Advised patient to get yearly dermatology exams due to risk of skin cancer.  Reviewed the importance of regular labs while on Simponi therapy.  Counseled patient that Simponi should be held prior to scheduled surgery.  Counseled patient to avoid live vaccines while on Simponi.  Advised patient to get annual influenza vaccine and the pneumococcal vaccine as indicated.  Provided patient with medication education material and answered all questions.  Patient voiced understanding.  Patient consented to Simponi.  Will upload consent into the media tab.  Will submit benefits investigation for Simponi Aria.

## 2017-10-20 ENCOUNTER — Telehealth: Payer: Self-pay

## 2017-10-20 NOTE — Telephone Encounter (Signed)
Patient was calling concerning an order for her Simponi Infusion.  Cb# [phone].  Please advise.  Thank You.

## 2017-10-22 ENCOUNTER — Other Ambulatory Visit: Payer: Self-pay | Admitting: *Deleted

## 2017-10-22 DIAGNOSIS — M0579 Rheumatoid arthritis with rheumatoid factor of multiple sites without organ or systems involvement: Secondary | ICD-10-CM

## 2017-10-22 NOTE — Telephone Encounter (Signed)
Patient advised infusion orders have been placed and she can call to schedule her appointment.

## 2017-10-27 ENCOUNTER — Encounter (HOSPITAL_COMMUNITY)
Admission: RE | Admit: 2017-10-27 | Discharge: 2017-10-27 | Disposition: A | Discharge status: Home / Self Care | Payer: Medicare Other | Source: Ambulatory Visit | Attending: Rheumatology | Admitting: Rheumatology

## 2017-10-27 DIAGNOSIS — M0579 Rheumatoid arthritis with rheumatoid factor of multiple sites without organ or systems involvement: Secondary | ICD-10-CM | POA: Insufficient documentation

## 2017-10-27 MED ORDER — DIPHENHYDRAMINE HCL 25 MG PO CAPS
25.0000 mg | ORAL_CAPSULE | ORAL | Status: DC
  Administered 2017-10-27: 25 mg via ORAL
  Filled 2017-10-27: qty 1

## 2017-10-27 MED ORDER — SODIUM CHLORIDE 0.9 % IV SOLN
150.0000 mg | INTRAVENOUS | Status: DC
  Administered 2017-10-27: 150 mg via INTRAVENOUS
  Filled 2017-10-27: qty 12

## 2017-10-27 MED ORDER — SODIUM CHLORIDE 0.9 % IV SOLN
INTRAVENOUS | Status: DC
  Administered 2017-10-27: 250 mL via INTRAVENOUS

## 2017-10-27 MED ORDER — ACETAMINOPHEN 325 MG PO TABS
650.0000 mg | ORAL_TABLET | ORAL | Status: DC
  Administered 2017-10-27: 650 mg via ORAL
  Filled 2017-10-27: qty 2

## 2017-11-23 ENCOUNTER — Telehealth: Payer: Self-pay | Admitting: Rheumatology

## 2017-11-23 ENCOUNTER — Other Ambulatory Visit: Payer: Self-pay | Admitting: *Deleted

## 2017-11-23 DIAGNOSIS — M0579 Rheumatoid arthritis with rheumatoid factor of multiple sites without organ or systems involvement: Secondary | ICD-10-CM

## 2017-11-23 NOTE — Progress Notes (Signed)
Infusion orders are current for patient CBC CMP Tylenol Benadryl appointments are up to date and follow up appointment  is scheduled TB gold not due yet.  

## 2017-11-23 NOTE — Telephone Encounter (Signed)
Please put in orders for patients Simponi Aria orders for patients appt tomorrow.

## 2017-11-23 NOTE — Telephone Encounter (Signed)
Infusion Orders placed.

## 2017-11-24 ENCOUNTER — Encounter (HOSPITAL_COMMUNITY)
Admission: RE | Admit: 2017-11-24 | Discharge: 2017-11-24 | Disposition: A | Discharge status: Home / Self Care | Payer: Medicare Other | Source: Ambulatory Visit | Attending: Rheumatology | Admitting: Rheumatology

## 2017-11-24 ENCOUNTER — Encounter (HOSPITAL_COMMUNITY): Payer: Self-pay

## 2017-11-24 DIAGNOSIS — M0579 Rheumatoid arthritis with rheumatoid factor of multiple sites without organ or systems involvement: Secondary | ICD-10-CM | POA: Insufficient documentation

## 2017-11-24 LAB — COMPREHENSIVE METABOLIC PANEL
ALT: 19 U/L (ref 14–54)
AST: 25 U/L (ref 15–41)
Albumin: 3.7 g/dL (ref 3.5–5.0)
Alkaline Phosphatase: 70 U/L (ref 38–126)
Anion gap: 10 (ref 5–15)
BUN: 24 mg/dL — AB (ref 6–20)
CHLORIDE: 104 mmol/L (ref 101–111)
CO2: 26 mmol/L (ref 22–32)
CREATININE: 0.75 mg/dL (ref 0.44–1.00)
Calcium: 9.2 mg/dL (ref 8.9–10.3)
GFR calc Af Amer: >60 mL/min (ref >60)
GFR calc non Af Amer: >60 mL/min (ref >60)
Glucose, Bld: 104 mg/dL — ABNORMAL HIGH (ref 65–99)
Potassium: 3.9 mmol/L (ref 3.5–5.1)
SODIUM: 140 mmol/L (ref 135–145)
Total Bilirubin: 0.5 mg/dL (ref 0.3–1.2)
Total Protein: 6.6 g/dL (ref 6.5–8.1)

## 2017-11-24 LAB — CBC
HCT: 38.3 % (ref 36.0–46.0)
HEMOGLOBIN: 11.9 g/dL — AB (ref 12.0–15.0)
MCH: 27.7 pg (ref 26.0–34.0)
MCHC: 31.1 g/dL (ref 30.0–36.0)
MCV: 89.1 fL (ref 78.0–100.0)
PLATELETS: 160 10*3/uL (ref 150–400)
RBC: 4.3 MIL/uL (ref 3.87–5.11)
RDW: 13.6 % (ref 11.5–15.5)
WBC: 5.4 10*3/uL (ref 4.0–10.5)

## 2017-11-24 MED ORDER — ACETAMINOPHEN 325 MG PO TABS: 650.0000 mg | ORAL_TABLET | ORAL | Status: DC

## 2017-11-24 MED ORDER — SODIUM CHLORIDE 0.9 % IV SOLN
Freq: Once | INTRAVENOUS | Status: AC
  Administered 2017-11-24: 250 mL via INTRAVENOUS

## 2017-11-24 MED ORDER — SODIUM CHLORIDE 0.9 % IV SOLN
150.0000 mg | INTRAVENOUS | Status: DC
  Administered 2017-11-24: 150 mg via INTRAVENOUS
  Filled 2017-11-24: qty 12

## 2017-11-24 MED ORDER — DIPHENHYDRAMINE HCL 25 MG PO CAPS: 25.0000 mg | ORAL_CAPSULE | ORAL | Status: DC

## 2017-11-24 NOTE — Progress Notes (Signed)
Results for Tiffany Pollard, Tiffany Pollard (MRN [id number]) as of 11/24/2017 14:02  Ref. Range 11/24/2017 07:59  COMPREHENSIVE METABOLIC PANEL Unknown Rpt (A)  Sodium Latest Ref Range: 135 - 145 mmol/L 140  Potassium Latest Ref Range: 3.5 - 5.1 mmol/L 3.9  Chloride Latest Ref Range: 101 - 111 mmol/L 104  CO2 Latest Ref Range: 22 - 32 mmol/L 26  Glucose Latest Ref Range: 65 - 99 mg/dL 104 (H)  BUN Latest Ref Range: 6 - 20 mg/dL 24 (H)  Creatinine Latest Ref Range: 0.44 - 1.00 mg/dL 0.75  Calcium Latest Ref Range: 8.9 - 10.3 mg/dL 9.2  Anion gap Latest Ref Range: 5 - 15  10  Alkaline Phosphatase Latest Ref Range: 38 - 126 U/L 70  Albumin Latest Ref Range: 3.5 - 5.0 g/dL 3.7  AST Latest Ref Range: 15 - 41 U/L 25  ALT Latest Ref Range: 14 - 54 U/L 19  Total Protein Latest Ref Range: 6.5 - 8.1 g/dL 6.6  Total Bilirubin Latest Ref Range: 0.3 - 1.2 mg/dL 0.5  GFR, Est Non African American Latest Ref Range: >60 mL/min >60  GFR, Est African American Latest Ref Range: >60 mL/min >60  WBC Latest Ref Range: 4.0 - 10.5 K/uL 5.4  RBC Latest Ref Range: 3.87 - 5.11 MIL/uL 4.30  Hemoglobin Latest Ref Range: 12.0 - 15.0 g/dL 11.9 (L)  HCT Latest Ref Range: 36.0 - 46.0 % 38.3  MCV Latest Ref Range: 78.0 - 100.0 fL 89.1  MCH Latest Ref Range: 26.0 - 34.0 pg 27.7  MCHC Latest Ref Range: 30.0 - 36.0 g/dL 31.1  RDW Latest Ref Range: 11.5 - 15.5 % 13.6  Platelets Latest Ref Range: 150 - 400 K/uL 160

## 2017-11-24 NOTE — Discharge Instructions (Signed)
Golimumab injection (subcutaneous or intravenous use) °What is this medicine? °GOLIMUMAB (goe LIM ue mab) is used to treat rheumatoid arthritis, psoriatic arthritis, and ankylosing spondylitis. It is also used to treat ulcerative colitis. °This medicine may be used for other purposes; ask your health care provider or pharmacist if you have questions. °COMMON BRAND NAME(S): Simponi, SIMPONI ARIA °What should I tell my health care provider before I take this medicine? °They need to know if you have any of these conditions: °-cancer °-diabetes °-Guillain-Barre syndrome °-heart failure °-hepatitis B or history of hepatitis B infection °-immune system problems °-infection or history of infections °-low blood counts like low white cell, platelet, or red cell counts °-multiple sclerosis °-recently received or scheduled to receive a vaccine °-tuberculosis, a positive skin test for tuberculosis or have recently been in close contact with someone who has tuberculosis °-an unusual reaction to golimumab, other medicines, latex, rubber, foods, dyes, or preservatives °-pregnant or trying to get pregnant °-breast-feeding °How should I use this medicine? °This medicine is for infusion into a vein or for injection under the skin. Infusions are given by a health care professional in a hospital or clinic setting. If you are to give your own medicine at home, you will be taught how to prepare and give this medicine under the skin. Use exactly as directed. Take your medicine at regular intervals. Do not take your medicine more often than directed. °It is important that you put your used needles and syringes in a special sharps container. Do not put them in a trash can. If you do not have a sharps container, call your pharmacist or healthcare provider to get one. °A special MedGuide will be given to you by the pharmacist with each prescription and refill. Be sure to read this information carefully each time. °Talk to your pediatrician  regarding the use of this medicine in children. Special care may be needed. °Overdosage: If you think you have taken too much of this medicine contact a poison control center or emergency room at once. °NOTE: This medicine is only for you. Do not share this medicine with others. °What if I miss a dose? °If you give your medicine by injection under the skin: If you miss a dose, take it as soon as you can. If it is almost time for your next dose, take only that dose. Do not take double or extra doses. Call your doctor or health care professional if you are not sure how to handle a missed dose. °If you are to be given an infusion: It is important not to miss your dose. Call your doctor or health care professional if you are unable to keep an appointment. °What may interact with this medicine? °Do not take this medicine with any of the following medications: °-abatacept °-adalimumab °-anakinra °-certolizumab °-etanercept °-infliximab °-live virus vaccines °-rituximab °-rilonacept °-tocilizumab °This medicine may also interact with the following medications: °-cyclosporine °-theophylline °-tofacitinib °-vaccines °-warfarin °This list may not describe all possible interactions. Give your health care provider a list of all the medicines, herbs, non-prescription drugs, or dietary supplements you use. Also tell them if you smoke, drink alcohol, or use illegal drugs. Some items may interact with your medicine. °What should I watch for while using this medicine? °Visit your doctor or health care professional for regular checks on your progress. Tell your doctor or healthcare professional if your symptoms do not start to get better or if they get worse. °You will be tested for tuberculosis (TB) before you   start this medicine. If your doctor prescribes any medicine for TB, you should start taking the TB medicine before starting this medicine. Make sure to finish the full course of TB medicine. °Call your doctor or health care  professional if you get a cold or other infection while receiving this medicine. Do not treat yourself. This medicine may decrease your body's ability to fight infection. °Talk to your doctor about your risk of cancer. You may be more at risk for certain types of cancers if you take this medicine. °What side effects may I notice from receiving this medicine? °Side effects that you should report to your doctor or health care professional as soon as possible: °-allergic reactions like skin rash, itching or hives, swelling of the face, lips, or tongue °-breathing problems °-changes in vision °-chest pain °-joint or muscle pain °-mouth sores °-numbness or tingling in any part of your body °-red, scaly patches or raised bumps on the skin °-signs and symptoms of infection like fever or chills; cough; sore throat; pain or trouble passing urine °-signs and symptoms of liver injury like dark yellow or brown urine; general ill feeling or flu-like symptoms; light-colored stools; loss of appetite; nausea; right upper belly pain; unusually weak or tired; yellowing of the eyes or skin °-swelling of the legs or ankles °-swollen lymph nodes in the neck, underarm, or groin areas °-unexplained weight loss °-unusual bleeding or bruising °-unusually weak or tired °Side effects that usually do not require medical attention (report to your doctor or health care professional if they continue or are bothersome): °-dizziness °-increased blood pressure °-nausea °-redness, itching, swelling, or bruising at site where injected °-runny nose °This list may not describe all possible side effects. Call your doctor for medical advice about side effects. You may report side effects to FDA at 1-800-FDA-1088. °Where should I keep my medicine? °Infusions will be given in a hospital or clinic and will not be stored at home. °Storage for syringes given under the skin and stored at home: °Keep out of the reach of children. Store in the original container  in a refrigerator between 2 and 8 degrees C (36 and 46 degrees F). Keep this medicine in the original container. Protect from light. Do not freeze. Throw away any unused medicine after the expiration date. °NOTE: This sheet is a summary. It may not cover all possible information. If you have questions about this medicine, talk to your doctor, pharmacist, or health care provider. °© 2018 [employer] (2016-10-14 08:42:06) ° °

## 2017-11-24 NOTE — Progress Notes (Signed)
Labs are stable except for anemia. Please, forward labs to her PCP.

## 2017-12-06 DIAGNOSIS — Z01419 Encounter for gynecological examination (general) (routine) without abnormal findings: Secondary | ICD-10-CM | POA: Diagnosis not present

## 2017-12-28 DIAGNOSIS — J111 Influenza due to unidentified influenza virus with other respiratory manifestations: Secondary | ICD-10-CM | POA: Diagnosis not present

## 2017-12-28 DIAGNOSIS — R509 Fever, unspecified: Secondary | ICD-10-CM | POA: Diagnosis not present

## 2017-12-28 DIAGNOSIS — Z6827 Body mass index (BMI) 27.0-27.9, adult: Secondary | ICD-10-CM | POA: Diagnosis not present

## 2017-12-28 DIAGNOSIS — Z299 Encounter for prophylactic measures, unspecified: Secondary | ICD-10-CM | POA: Diagnosis not present

## 2017-12-28 DIAGNOSIS — I10 Essential (primary) hypertension: Secondary | ICD-10-CM | POA: Diagnosis not present

## 2017-12-28 DIAGNOSIS — M069 Rheumatoid arthritis, unspecified: Secondary | ICD-10-CM | POA: Diagnosis not present

## 2017-12-28 DIAGNOSIS — Z87891 Personal history of nicotine dependence: Secondary | ICD-10-CM | POA: Diagnosis not present

## 2017-12-30 ENCOUNTER — Telehealth: Payer: Self-pay

## 2017-12-30 NOTE — Telephone Encounter (Signed)
Spoke with pt. She states that she test positive for the flu. She has been given Tamiflu with an additional antibiotic. She will be finished with them 01/07/2018. Her next infusion is scheduled for 01/19/18. She wants to know if she should hold off on getting her infusion or will she be able to continue. Pleas advise.  Quinn Quam, [place], CPhT 2:52 PM

## 2017-12-30 NOTE — Telephone Encounter (Signed)
Called pt to check benefits for 2019. She states that her plans remain the same as 2018 (Medicare/supplement). No pre-cert required.   Hamlet Lasecki, [place], CPhT 2:45 PM

## 2017-12-31 NOTE — Telephone Encounter (Signed)
Patient advised as long she is feeling well and finished with the antibiotic she may take her infusion. Patient verbalized understanding.

## 2018-01-12 ENCOUNTER — Ambulatory Visit (INDEPENDENT_AMBULATORY_CARE_PROVIDER_SITE_OTHER): Payer: Medicare Other | Admitting: Orthopaedic Surgery

## 2018-01-14 ENCOUNTER — Other Ambulatory Visit: Payer: Self-pay | Admitting: *Deleted

## 2018-01-14 DIAGNOSIS — M0579 Rheumatoid arthritis with rheumatoid factor of multiple sites without organ or systems involvement: Secondary | ICD-10-CM

## 2018-01-19 ENCOUNTER — Encounter (HOSPITAL_COMMUNITY)
Admission: RE | Admit: 2018-01-19 | Discharge: 2018-01-19 | Disposition: A | Discharge status: Home / Self Care | Payer: Medicare Other | Source: Ambulatory Visit | Attending: Rheumatology | Admitting: Rheumatology

## 2018-01-19 DIAGNOSIS — M0579 Rheumatoid arthritis with rheumatoid factor of multiple sites without organ or systems involvement: Secondary | ICD-10-CM | POA: Insufficient documentation

## 2018-01-19 LAB — COMPREHENSIVE METABOLIC PANEL
ALK PHOS: 77 U/L (ref 38–126)
ALT: 25 U/L (ref 14–54)
AST: 30 U/L (ref 15–41)
Albumin: 3.9 g/dL (ref 3.5–5.0)
Anion gap: 10 (ref 5–15)
BILIRUBIN TOTAL: 0.5 mg/dL (ref 0.3–1.2)
BUN: 21 mg/dL — ABNORMAL HIGH (ref 6–20)
CALCIUM: 9.4 mg/dL (ref 8.9–10.3)
CO2: 27 mmol/L (ref 22–32)
Chloride: 103 mmol/L (ref 101–111)
Creatinine, Ser: 0.72 mg/dL (ref 0.44–1.00)
GFR calc non Af Amer: >60 mL/min (ref >60)
GLUCOSE: 78 mg/dL (ref 65–99)
Potassium: 4.1 mmol/L (ref 3.5–5.1)
Sodium: 140 mmol/L (ref 135–145)
TOTAL PROTEIN: 7 g/dL (ref 6.5–8.1)

## 2018-01-19 LAB — CBC
HEMATOCRIT: 39.7 % (ref 36.0–46.0)
HEMOGLOBIN: 12.6 g/dL (ref 12.0–15.0)
MCH: 27.5 pg (ref 26.0–34.0)
MCHC: 31.7 g/dL (ref 30.0–36.0)
MCV: 86.7 fL (ref 78.0–100.0)
Platelets: 165 10*3/uL (ref 150–400)
RBC: 4.58 MIL/uL (ref 3.87–5.11)
RDW: 13.7 % (ref 11.5–15.5)
WBC: 5.5 10*3/uL (ref 4.0–10.5)

## 2018-01-19 MED ORDER — SODIUM CHLORIDE 0.9 % IV SOLN
2.0000 mg/kg | INTRAVENOUS | Status: DC
  Administered 2018-01-19: 156.25 mg via INTRAVENOUS
  Filled 2018-01-19: qty 12.5

## 2018-01-19 MED ORDER — SODIUM CHLORIDE 0.9 % IV SOLN
INTRAVENOUS | Status: DC
  Administered 2018-01-19: 08:00:00 via INTRAVENOUS

## 2018-01-19 MED ORDER — DIPHENHYDRAMINE HCL 25 MG PO CAPS: 25.0000 mg | ORAL_CAPSULE | ORAL | Status: DC

## 2018-01-19 MED ORDER — ACETAMINOPHEN 325 MG PO TABS: 650.0000 mg | ORAL_TABLET | ORAL | Status: DC

## 2018-01-19 NOTE — Progress Notes (Signed)
Office Visit Note  Patient: Tiffany Pollard             Date of Birth: February 11, 1951           MRN: [id number]             PCP: Monico Blitz, MD Referring: Monico Blitz, MD Visit Date: 02/02/2018 Occupation: @GUAROCC @    Subjective:  Other (wants to discuss simponi aria infusions)   History of Present Illness: Tiffany Pollard is a 67 y.o. female with history of seropositive rheumatoid arthritis.  She states she has had 3 infusions of Simponi Aria so far.  She states the day of infusion she feels very tired and it takes her a couple of days to get better.  She has noticed improvement in her joint pain and swelling.  She states just prior to her next infusion she starts feeling some joint discomfort.  She has had a recent upper respiratory tract infection for which she has recovered.  She has been also under a lot of stress of her husband was diagnosed with a stage I lung cancer and he had surgery for that.  Activities of Daily Living:  Patient reports morning stiffness for 15 minutes.   Patient Denies nocturnal pain.  Difficulty dressing/grooming: Denies Difficulty climbing stairs: Denies Difficulty getting out of chair: Denies Difficulty using hands for taps, buttons, cutlery, and/or writing: Reports due to history of fracture.   Review of Systems  Constitutional: Positive for fatigue. Negative for night sweats, weight gain, weight loss and weakness.  HENT: Negative for mouth sores, trouble swallowing, trouble swallowing, mouth dryness and nose dryness.   Eyes: Negative for pain, redness, visual disturbance and dryness.  Respiratory: Negative for cough, shortness of breath and difficulty breathing.   Cardiovascular: Negative for chest pain, palpitations, hypertension, irregular heartbeat and swelling in legs/feet.  Gastrointestinal: Negative for blood in stool, constipation and diarrhea.  Endocrine: Negative for increased urination.  Genitourinary: Negative for vaginal dryness.    Musculoskeletal: Positive for arthralgias, joint pain and morning stiffness. Negative for joint swelling, myalgias, muscle weakness, muscle tenderness and myalgias.  Skin: Negative for color change, rash, hair loss, skin tightness, ulcers and sensitivity to sunlight.  Allergic/Immunologic: Negative for susceptible to infections.  Neurological: Negative for dizziness, memory loss and night sweats.  Hematological: Negative for swollen glands.  Psychiatric/Behavioral: Negative for depressed mood and sleep disturbance. The patient is nervous/anxious.     PMFS History:  Patient Active Problem List   Diagnosis Date Noted  . Primary osteoarthritis of both knees 04/30/2017  . History of anemia 04/30/2017  . History of migraine 04/30/2017  . History of vertigo 04/30/2017  . History of gastroesophageal reflux (GERD) 04/30/2017  . History of kidney stones 04/30/2017  . History of IBS 04/30/2017  . History of hyperlipidemia 04/30/2017  . Hypercalcemia 04/30/2017  . Rheumatoid arthritis involving multiple sites with positive rheumatoid factor ([place]) 12/08/2016  . High risk medication use 12/08/2016  . Primary osteoarthritis of both hands 12/08/2016  . Primary osteoarthritis of both feet 12/08/2016  . Osteoarthritis of lumbar spine 12/08/2016  . Former smoker 12/08/2016  . Abdominal pain, chronic, right lower quadrant 12/11/2013    Past Medical History:  Diagnosis Date  . GERD (gastroesophageal reflux disease)   . High blood pressure   . Rheumatoid arthritis(714.0)     History reviewed. No pertinent family history. Past Surgical History:  Procedure Laterality Date  . BACK SURGERY    . CERVICAL CONIZATION W/BX    .  COLONOSCOPY N/A 12/27/2013   Procedure: COLONOSCOPY;  Surgeon: Rogene Houston, MD;  Location: [facility];  Service: Endoscopy;  Laterality: N/A;  320-moved to 125 Ann to notify pt  . FINGER FRACTURE SURGERY    . foot surgery for a bunion    . NASAL SINUS SURGERY    .  TONSILLECTOMY     Social History   Social History Narrative  . Not on file     Objective: Vital Signs: BP 113/60 (BP Location: Left Arm, Patient Position: Sitting, Cuff Size: Normal)   Pulse 98   Resp 15   Ht 5\' 6"  (1.676 m)   Wt 170 lb 8 oz (77.3 kg)   BMI 27.52 kg/m    Physical Exam  Constitutional: She is oriented to person, place, and time. She appears well-developed and well-nourished.  HENT:  Head: Normocephalic and atraumatic.  Eyes: Conjunctivae and EOM are normal.  Neck: Normal range of motion.  Cardiovascular: Normal rate, regular rhythm, normal heart sounds and intact distal pulses.  Pulmonary/Chest: Effort normal and breath sounds normal.  Abdominal: Soft. Bowel sounds are normal.  Lymphadenopathy:    She has no cervical adenopathy.  Neurological: She is alert and oriented to person, place, and time.  Skin: Skin is warm and dry. Capillary refill takes less than 2 seconds.  Psychiatric: She has a normal mood and affect. Her behavior is normal.  Nursing note and vitals reviewed.    Musculoskeletal Exam: C-spine thoracic lumbar spine good range of motion.  She is some discomfort range of motion of the lumbar spine.  She has good range of motion of shoulders elbows wrist joints and MCPs PIPs DIPs.  She has some synovitis in her right first MCP joint.  She has DIP PIP thickening in her hands and feet consistent with osteoarthritis.  She has some crepitus in her knee joints without any warmth swelling or effusion.  CDAI Exam: CDAI Homunculus Exam:   Tenderness:  Right hand: 1st MCP  Swelling:  Right hand: 1st MCP  Joint Counts:  CDAI Tender Joint count: 1 CDAI Swollen Joint count: 1  Global Assessments:  Patient Global Assessment: 3 Provider Global Assessment: 3  CDAI Calculated Score: 8    Investigation: No additional findings.TB Gold: 03/11/2017 Negative CBC Latest Ref Rng & Units 01/19/2018 11/24/2017 09/13/2017  WBC 4.0 - 10.5 K/uL 5.5 5.4 7.6    Hemoglobin 12.0 - 15.0 g/dL 12.6 11.9(L) 13.8  Hematocrit 36.0 - 46.0 % 39.7 38.3 40.9  Platelets 150 - 400 K/uL 165 160 191   CMP Latest Ref Rng & Units 01/19/2018 11/24/2017 09/13/2017  Glucose 65 - 99 mg/dL 78 104(H) 82  BUN 6 - 20 mg/dL 21(H) 24(H) 18  Creatinine 0.44 - 1.00 mg/dL 0.72 0.75 0.75  Sodium 135 - 145 mmol/L 140 140 139  Potassium 3.5 - 5.1 mmol/L 4.1 3.9 4.2  Chloride 101 - 111 mmol/L 103 104 101  CO2 22 - 32 mmol/L 27 26 30   Calcium 8.9 - 10.3 mg/dL 9.4 9.2 10.1  Total Protein 6.5 - 8.1 g/dL 7.0 6.6 7.0  Total Bilirubin 0.3 - 1.2 mg/dL 0.5 0.5 0.4  Alkaline Phos 38 - 126 U/L 77 70 -  AST 15 - 41 U/L 30 25 26   ALT 14 - 54 U/L 25 19 31(H)    Imaging: No results found.  Speciality Comments: No specialty comments available.    Procedures:  No procedures performed Allergies: Patient has no known allergies.   Assessment / Plan:  Visit Diagnoses: Rheumatoid arthritis involving multiple sites with positive rheumatoid factor ([facility]) -  +RF +CCP Ab, -ANA.  Patient is doing better on Symphony Aria infusions.  She still have some residual synovitis in her right second MCP joint.  We will observe for right now.  High risk medication use - Simponi Aria IV.  Her labs are stable.  We will continue to monitor labs every 3 months.  TB gold will be due with next labs.  She gets her labs with infusions.  Primary osteoarthritis of both hands: Joint protection muscle strengthening discussed.  Primary osteoarthritis of both feet: She is not having much discomfort currently.  Primary osteoarthritis of both knees: Her knee joints are doing better.  Spondylosis of lumbar region without myelopathy or radiculopathy: Chronic pain.  Former smoker: She has 30+ years of smoking history.  Her husband was recently diagnosed with lung cancer. she is concerned about possible lung cancer.  I will order low-dose CT chest for lung cancer screening.  History of vertigo  History of  anemia  History of migraine  History of gastroesophageal reflux (GERD)  History of kidney stones  History of hyperlipidemia  History of IBS    Orders: No orders of the defined types were placed in this encounter.  No orders of the defined types were placed in this encounter.   Face-to-face time spent with patient was 30 minutes. Greater than50% of time was spent in counseling and coordination of care.  Follow-Up Instructions: Return in about 5 months (around 07/02/2018) for Rheumatoid arthritis.   Bo Merino, MD  Note - This record has been created using Editor, commissioning.  Chart creation errors have been sought, but may not always  have been located. Such creation errors do not reflect on  the standard of medical care.

## 2018-02-02 ENCOUNTER — Other Ambulatory Visit (INDEPENDENT_AMBULATORY_CARE_PROVIDER_SITE_OTHER): Payer: Self-pay | Admitting: *Deleted

## 2018-02-02 ENCOUNTER — Ambulatory Visit (INDEPENDENT_AMBULATORY_CARE_PROVIDER_SITE_OTHER): Payer: Medicare Other | Admitting: Rheumatology

## 2018-02-02 ENCOUNTER — Encounter: Payer: Self-pay | Admitting: Rheumatology

## 2018-02-02 VITALS — BP 113/60 | HR 98 | Resp 15 | Ht 66.0 in | Wt 170.5 lb

## 2018-02-02 DIAGNOSIS — Z87442 Personal history of urinary calculi: Secondary | ICD-10-CM

## 2018-02-02 DIAGNOSIS — M19041 Primary osteoarthritis, right hand: Secondary | ICD-10-CM | POA: Diagnosis not present

## 2018-02-02 DIAGNOSIS — M19072 Primary osteoarthritis, left ankle and foot: Secondary | ICD-10-CM

## 2018-02-02 DIAGNOSIS — Z87898 Personal history of other specified conditions: Secondary | ICD-10-CM

## 2018-02-02 DIAGNOSIS — M19042 Primary osteoarthritis, left hand: Secondary | ICD-10-CM

## 2018-02-02 DIAGNOSIS — Z87891 Personal history of nicotine dependence: Secondary | ICD-10-CM | POA: Diagnosis not present

## 2018-02-02 DIAGNOSIS — Z8639 Personal history of other endocrine, nutritional and metabolic disease: Secondary | ICD-10-CM

## 2018-02-02 DIAGNOSIS — M47816 Spondylosis without myelopathy or radiculopathy, lumbar region: Secondary | ICD-10-CM | POA: Diagnosis not present

## 2018-02-02 DIAGNOSIS — Z862 Personal history of diseases of the blood and blood-forming organs and certain disorders involving the immune mechanism: Secondary | ICD-10-CM | POA: Diagnosis not present

## 2018-02-02 DIAGNOSIS — Z122 Encounter for screening for malignant neoplasm of respiratory organs: Secondary | ICD-10-CM

## 2018-02-02 DIAGNOSIS — Z8719 Personal history of other diseases of the digestive system: Secondary | ICD-10-CM | POA: Diagnosis not present

## 2018-02-02 DIAGNOSIS — M170 Bilateral primary osteoarthritis of knee: Secondary | ICD-10-CM | POA: Diagnosis not present

## 2018-02-02 DIAGNOSIS — M0579 Rheumatoid arthritis with rheumatoid factor of multiple sites without organ or systems involvement: Secondary | ICD-10-CM | POA: Diagnosis not present

## 2018-02-02 DIAGNOSIS — Z8669 Personal history of other diseases of the nervous system and sense organs: Secondary | ICD-10-CM

## 2018-02-02 DIAGNOSIS — Z79899 Other long term (current) drug therapy: Secondary | ICD-10-CM | POA: Diagnosis not present

## 2018-02-02 DIAGNOSIS — M19071 Primary osteoarthritis, right ankle and foot: Secondary | ICD-10-CM

## 2018-02-04 ENCOUNTER — Other Ambulatory Visit (INDEPENDENT_AMBULATORY_CARE_PROVIDER_SITE_OTHER): Payer: Self-pay | Admitting: *Deleted

## 2018-02-04 DIAGNOSIS — M0579 Rheumatoid arthritis with rheumatoid factor of multiple sites without organ or systems involvement: Secondary | ICD-10-CM

## 2018-02-04 DIAGNOSIS — R0602 Shortness of breath: Secondary | ICD-10-CM

## 2018-02-04 DIAGNOSIS — Z122 Encounter for screening for malignant neoplasm of respiratory organs: Secondary | ICD-10-CM

## 2018-02-08 ENCOUNTER — Other Ambulatory Visit: Payer: Self-pay | Admitting: Rheumatology

## 2018-02-08 DIAGNOSIS — Z87891 Personal history of nicotine dependence: Secondary | ICD-10-CM

## 2018-02-08 DIAGNOSIS — Z122 Encounter for screening for malignant neoplasm of respiratory organs: Secondary | ICD-10-CM

## 2018-02-08 DIAGNOSIS — R5383 Other fatigue: Secondary | ICD-10-CM

## 2018-02-15 ENCOUNTER — Ambulatory Visit (HOSPITAL_COMMUNITY): Payer: Medicare Other

## 2018-02-22 ENCOUNTER — Ambulatory Visit (HOSPITAL_COMMUNITY)
Admission: RE | Admit: 2018-02-22 | Discharge: 2018-02-22 | Disposition: A | Discharge status: Home / Self Care | Payer: Medicare Other | Source: Ambulatory Visit | Attending: Rheumatology | Admitting: Rheumatology

## 2018-02-22 DIAGNOSIS — Z122 Encounter for screening for malignant neoplasm of respiratory organs: Secondary | ICD-10-CM | POA: Insufficient documentation

## 2018-02-22 DIAGNOSIS — Z87891 Personal history of nicotine dependence: Secondary | ICD-10-CM | POA: Insufficient documentation

## 2018-02-22 DIAGNOSIS — I700 Atherosclerosis of aorta: Secondary | ICD-10-CM | POA: Insufficient documentation

## 2018-02-22 DIAGNOSIS — R5383 Other fatigue: Secondary | ICD-10-CM | POA: Diagnosis not present

## 2018-02-23 NOTE — Progress Notes (Signed)
I called patient and discussed results of the CT scan with her.  She will need repeat CT scan in 1 year.  Please forward a copy of these results to her PCP.  We can schedule another CT scan for 1 year or have the patient remind [place] in 1 year.

## 2018-03-04 ENCOUNTER — Other Ambulatory Visit: Payer: Self-pay | Admitting: *Deleted

## 2018-03-04 DIAGNOSIS — M0579 Rheumatoid arthritis with rheumatoid factor of multiple sites without organ or systems involvement: Secondary | ICD-10-CM

## 2018-03-14 ENCOUNTER — Other Ambulatory Visit: Payer: Self-pay

## 2018-03-14 ENCOUNTER — Telehealth: Payer: Self-pay | Admitting: Rheumatology

## 2018-03-14 NOTE — Telephone Encounter (Signed)
Patient called requesting her labwork and orders for her Simponi infusion be sent to [facility].  Patient states her appointment is on 4/10 at 8:00 am.

## 2018-03-14 NOTE — Telephone Encounter (Signed)
Patient was advised that infusion orders are in. Patient requested the TB Gold be added. That order has been placed with the infusion order.

## 2018-03-16 ENCOUNTER — Encounter (HOSPITAL_COMMUNITY)
Admission: RE | Admit: 2018-03-16 | Discharge: 2018-03-16 | Disposition: A | Discharge status: Home / Self Care | Payer: Medicare Other | Source: Ambulatory Visit | Attending: Rheumatology | Admitting: Rheumatology

## 2018-03-16 DIAGNOSIS — M0579 Rheumatoid arthritis with rheumatoid factor of multiple sites without organ or systems involvement: Secondary | ICD-10-CM | POA: Insufficient documentation

## 2018-03-16 LAB — CBC
HCT: 40.5 % (ref 36.0–46.0)
Hemoglobin: 12.8 g/dL (ref 12.0–15.0)
MCH: 27.3 pg (ref 26.0–34.0)
MCHC: 31.6 g/dL (ref 30.0–36.0)
MCV: 86.4 fL (ref 78.0–100.0)
PLATELETS: 168 10*3/uL (ref 150–400)
RBC: 4.69 MIL/uL (ref 3.87–5.11)
RDW: 14.5 % (ref 11.5–15.5)
WBC: 6.2 10*3/uL (ref 4.0–10.5)

## 2018-03-16 LAB — COMPREHENSIVE METABOLIC PANEL
ALK PHOS: 73 U/L (ref 38–126)
ALT: 24 U/L (ref 14–54)
ANION GAP: 10 (ref 5–15)
AST: 31 U/L (ref 15–41)
Albumin: 3.8 g/dL (ref 3.5–5.0)
BUN: 24 mg/dL — ABNORMAL HIGH (ref 6–20)
CALCIUM: 9.4 mg/dL (ref 8.9–10.3)
CO2: 27 mmol/L (ref 22–32)
Chloride: 103 mmol/L (ref 101–111)
Creatinine, Ser: 0.8 mg/dL (ref 0.44–1.00)
Glucose, Bld: 72 mg/dL (ref 65–99)
Potassium: 4.3 mmol/L (ref 3.5–5.1)
SODIUM: 140 mmol/L (ref 135–145)
Total Bilirubin: 0.6 mg/dL (ref 0.3–1.2)
Total Protein: 6.6 g/dL (ref 6.5–8.1)

## 2018-03-16 MED ORDER — SODIUM CHLORIDE 0.9 % IV SOLN
150.0000 mg | INTRAVENOUS | Status: DC
  Administered 2018-03-16: 150 mg via INTRAVENOUS
  Filled 2018-03-16: qty 12

## 2018-03-16 MED ORDER — SODIUM CHLORIDE 0.9 % IV SOLN
INTRAVENOUS | Status: DC
  Administered 2018-03-16: 250 mL via INTRAVENOUS

## 2018-03-16 MED ORDER — DIPHENHYDRAMINE HCL 25 MG PO CAPS: 25.0000 mg | ORAL_CAPSULE | ORAL | Status: DC

## 2018-03-16 MED ORDER — DIPHENHYDRAMINE HCL 25 MG PO CAPS
ORAL_CAPSULE | ORAL | Status: AC
  Filled 2018-03-16: qty 1

## 2018-03-16 MED ORDER — ACETAMINOPHEN 325 MG PO TABS: 650.0000 mg | ORAL_TABLET | ORAL | Status: DC

## 2018-03-16 MED ORDER — ACETAMINOPHEN 325 MG PO TABS
ORAL_TABLET | ORAL | Status: AC
  Filled 2018-03-16: qty 2

## 2018-03-16 NOTE — Progress Notes (Signed)
Results for DAQUISHA, CLERMONT (MRN [id number]) as of 03/16/2018 10:25  Ref. Range 03/16/2018 08:44  COMPREHENSIVE METABOLIC PANEL Unknown Rpt (A)  Sodium Latest Ref Range: 135 - 145 mmol/L 140  Potassium Latest Ref Range: 3.5 - 5.1 mmol/L 4.3  Chloride Latest Ref Range: 101 - 111 mmol/L 103  CO2 Latest Ref Range: 22 - 32 mmol/L 27  Glucose Latest Ref Range: 65 - 99 mg/dL 72  BUN Latest Ref Range: 6 - 20 mg/dL 24 (H)  Creatinine Latest Ref Range: 0.44 - 1.00 mg/dL 0.80  Calcium Latest Ref Range: 8.9 - 10.3 mg/dL 9.4  Anion gap Latest Ref Range: 5 - 15  10  Alkaline Phosphatase Latest Ref Range: 38 - 126 U/L 73  Albumin Latest Ref Range: 3.5 - 5.0 g/dL 3.8  AST Latest Ref Range: 15 - 41 U/L 31  ALT Latest Ref Range: 14 - 54 U/L 24  Total Protein Latest Ref Range: 6.5 - 8.1 g/dL 6.6  Total Bilirubin Latest Ref Range: 0.3 - 1.2 mg/dL 0.6  GFR, Est Non African American Latest Ref Range: >60 mL/min >60  GFR, Est African American Latest Ref Range: >60 mL/min >60  WBC Latest Ref Range: 4.0 - 10.5 K/uL 6.2  RBC Latest Ref Range: 3.87 - 5.11 MIL/uL 4.69  Hemoglobin Latest Ref Range: 12.0 - 15.0 g/dL 12.8  HCT Latest Ref Range: 36.0 - 46.0 % 40.5  MCV Latest Ref Range: 78.0 - 100.0 fL 86.4  MCH Latest Ref Range: 26.0 - 34.0 pg 27.3  MCHC Latest Ref Range: 30.0 - 36.0 g/dL 31.6  RDW Latest Ref Range: 11.5 - 15.5 % 14.5  Platelets Latest Ref Range: 150 - 400 K/uL 168

## 2018-03-19 LAB — QUANTIFERON-TB GOLD PLUS (RQFGPL)
QUANTIFERON NIL VALUE: 0.02 [IU]/mL
QUANTIFERON TB2 AG VALUE: 0.02 [IU]/mL
QuantiFERON Mitogen Value: >10 IU/mL
QuantiFERON TB1 Ag Value: 0.02 IU/mL

## 2018-03-19 LAB — QUANTIFERON-TB GOLD PLUS: QUANTIFERON-TB GOLD PLUS: NEGATIVE

## 2018-05-09 ENCOUNTER — Other Ambulatory Visit (HOSPITAL_COMMUNITY): Payer: Self-pay | Admitting: Internal Medicine

## 2018-05-09 DIAGNOSIS — Z1231 Encounter for screening mammogram for malignant neoplasm of breast: Secondary | ICD-10-CM

## 2018-05-10 ENCOUNTER — Telehealth: Payer: Self-pay | Admitting: Rheumatology

## 2018-05-10 ENCOUNTER — Other Ambulatory Visit: Payer: Self-pay | Admitting: *Deleted

## 2018-05-10 DIAGNOSIS — M0579 Rheumatoid arthritis with rheumatoid factor of multiple sites without organ or systems involvement: Secondary | ICD-10-CM

## 2018-05-10 NOTE — Telephone Encounter (Signed)
Infusion orders placed

## 2018-05-10 NOTE — Telephone Encounter (Signed)
Patient is scheduled for her Simponi tomorrow 05/11/18 at 8:30a. Please put orders in for patient.

## 2018-05-11 ENCOUNTER — Encounter (HOSPITAL_COMMUNITY)
Admission: RE | Admit: 2018-05-11 | Discharge: 2018-05-11 | Disposition: A | Discharge status: Home / Self Care | Payer: Medicare Other | Source: Ambulatory Visit | Attending: Rheumatology | Admitting: Rheumatology

## 2018-05-11 DIAGNOSIS — M0579 Rheumatoid arthritis with rheumatoid factor of multiple sites without organ or systems involvement: Secondary | ICD-10-CM | POA: Insufficient documentation

## 2018-05-11 LAB — COMPREHENSIVE METABOLIC PANEL
ALK PHOS: 82 U/L (ref 38–126)
ALT: 30 U/L (ref 14–54)
AST: 34 U/L (ref 15–41)
Albumin: 3.7 g/dL (ref 3.5–5.0)
Anion gap: 7 (ref 5–15)
BUN: 24 mg/dL — ABNORMAL HIGH (ref 6–20)
CALCIUM: 9.2 mg/dL (ref 8.9–10.3)
CO2: 29 mmol/L (ref 22–32)
CREATININE: 0.76 mg/dL (ref 0.44–1.00)
Chloride: 104 mmol/L (ref 101–111)
GFR calc Af Amer: >60 mL/min (ref >60)
GFR calc non Af Amer: >60 mL/min (ref >60)
Glucose, Bld: 97 mg/dL (ref 65–99)
Potassium: 4 mmol/L (ref 3.5–5.1)
SODIUM: 140 mmol/L (ref 135–145)
Total Bilirubin: 0.5 mg/dL (ref 0.3–1.2)
Total Protein: 6.6 g/dL (ref 6.5–8.1)

## 2018-05-11 LAB — CBC
HCT: 39.6 % (ref 36.0–46.0)
HEMOGLOBIN: 12.2 g/dL (ref 12.0–15.0)
MCH: 27.2 pg (ref 26.0–34.0)
MCHC: 30.8 g/dL (ref 30.0–36.0)
MCV: 88.2 fL (ref 78.0–100.0)
Platelets: 171 10*3/uL (ref 150–400)
RBC: 4.49 MIL/uL (ref 3.87–5.11)
RDW: 14.1 % (ref 11.5–15.5)
WBC: 6.2 10*3/uL (ref 4.0–10.5)

## 2018-05-11 MED ORDER — GOLIMUMAB 50 MG/4ML IV SOLN
150.0000 mg | INTRAVENOUS | Status: DC
  Administered 2018-05-11: 150 mg via INTRAVENOUS
  Filled 2018-05-11: qty 12

## 2018-05-11 MED ORDER — SODIUM CHLORIDE 0.9 % IV SOLN
INTRAVENOUS | Status: DC
  Administered 2018-05-11: 250 mL via INTRAVENOUS

## 2018-05-11 MED ORDER — DIPHENHYDRAMINE HCL 25 MG PO CAPS: 25.0000 mg | ORAL_CAPSULE | ORAL | Status: DC

## 2018-05-11 MED ORDER — ACETAMINOPHEN 325 MG PO TABS: 650.0000 mg | ORAL_TABLET | ORAL | Status: DC

## 2018-05-11 NOTE — Progress Notes (Signed)
Results for Tiffany Pollard, Tiffany Pollard (MRN [id number]) as of 05/11/2018 13:22  Ref. Range 05/11/2018 08:15  COMPREHENSIVE METABOLIC PANEL Unknown Rpt (A)  Sodium Latest Ref Range: 135 - 145 mmol/L 140  Potassium Latest Ref Range: 3.5 - 5.1 mmol/L 4.0  Chloride Latest Ref Range: 101 - 111 mmol/L 104  CO2 Latest Ref Range: 22 - 32 mmol/L 29  Glucose Latest Ref Range: 65 - 99 mg/dL 97  BUN Latest Ref Range: 6 - 20 mg/dL 24 (H)  Creatinine Latest Ref Range: 0.44 - 1.00 mg/dL 0.76  Calcium Latest Ref Range: 8.9 - 10.3 mg/dL 9.2  Anion gap Latest Ref Range: 5 - 15  7  Alkaline Phosphatase Latest Ref Range: 38 - 126 U/L 82  Albumin Latest Ref Range: 3.5 - 5.0 g/dL 3.7  AST Latest Ref Range: 15 - 41 U/L 34  ALT Latest Ref Range: 14 - 54 U/L 30  Total Protein Latest Ref Range: 6.5 - 8.1 g/dL 6.6  Total Bilirubin Latest Ref Range: 0.3 - 1.2 mg/dL 0.5  GFR, Est Non African American Latest Ref Range: >60 mL/min >60  GFR, Est African American Latest Ref Range: >60 mL/min >60  WBC Latest Ref Range: 4.0 - 10.5 K/uL 6.2  RBC Latest Ref Range: 3.87 - 5.11 MIL/uL 4.49  Hemoglobin Latest Ref Range: 12.0 - 15.0 g/dL 12.2  HCT Latest Ref Range: 36.0 - 46.0 % 39.6  MCV Latest Ref Range: 78.0 - 100.0 fL 88.2  MCH Latest Ref Range: 26.0 - 34.0 pg 27.2  MCHC Latest Ref Range: 30.0 - 36.0 g/dL 30.8  RDW Latest Ref Range: 11.5 - 15.5 % 14.1  Platelets Latest Ref Range: 150 - 400 K/uL 171

## 2018-05-12 DIAGNOSIS — Z6828 Body mass index (BMI) 28.0-28.9, adult: Secondary | ICD-10-CM | POA: Diagnosis not present

## 2018-05-12 DIAGNOSIS — J069 Acute upper respiratory infection, unspecified: Secondary | ICD-10-CM | POA: Diagnosis not present

## 2018-05-12 DIAGNOSIS — Z299 Encounter for prophylactic measures, unspecified: Secondary | ICD-10-CM | POA: Diagnosis not present

## 2018-05-12 DIAGNOSIS — Z87891 Personal history of nicotine dependence: Secondary | ICD-10-CM | POA: Diagnosis not present

## 2018-05-12 DIAGNOSIS — I10 Essential (primary) hypertension: Secondary | ICD-10-CM | POA: Diagnosis not present

## 2018-05-19 ENCOUNTER — Ambulatory Visit (HOSPITAL_COMMUNITY)
Admission: RE | Admit: 2018-05-19 | Discharge: 2018-05-19 | Disposition: A | Discharge status: Home / Self Care | Payer: Medicare Other | Source: Ambulatory Visit | Attending: Internal Medicine | Admitting: Internal Medicine

## 2018-05-19 ENCOUNTER — Encounter (HOSPITAL_COMMUNITY): Payer: Self-pay

## 2018-05-19 DIAGNOSIS — Z1231 Encounter for screening mammogram for malignant neoplasm of breast: Secondary | ICD-10-CM | POA: Diagnosis not present

## 2018-06-03 DIAGNOSIS — Z6828 Body mass index (BMI) 28.0-28.9, adult: Secondary | ICD-10-CM | POA: Diagnosis not present

## 2018-06-03 DIAGNOSIS — M069 Rheumatoid arthritis, unspecified: Secondary | ICD-10-CM | POA: Diagnosis not present

## 2018-06-03 DIAGNOSIS — R350 Frequency of micturition: Secondary | ICD-10-CM | POA: Diagnosis not present

## 2018-06-03 DIAGNOSIS — I10 Essential (primary) hypertension: Secondary | ICD-10-CM | POA: Diagnosis not present

## 2018-06-03 DIAGNOSIS — N390 Urinary tract infection, site not specified: Secondary | ICD-10-CM | POA: Diagnosis not present

## 2018-06-03 DIAGNOSIS — Z299 Encounter for prophylactic measures, unspecified: Secondary | ICD-10-CM | POA: Diagnosis not present

## 2018-06-06 DIAGNOSIS — Z6828 Body mass index (BMI) 28.0-28.9, adult: Secondary | ICD-10-CM | POA: Diagnosis not present

## 2018-06-06 DIAGNOSIS — R21 Rash and other nonspecific skin eruption: Secondary | ICD-10-CM | POA: Diagnosis not present

## 2018-06-06 DIAGNOSIS — I10 Essential (primary) hypertension: Secondary | ICD-10-CM | POA: Diagnosis not present

## 2018-06-06 DIAGNOSIS — R05 Cough: Secondary | ICD-10-CM | POA: Diagnosis not present

## 2018-06-06 DIAGNOSIS — M069 Rheumatoid arthritis, unspecified: Secondary | ICD-10-CM | POA: Diagnosis not present

## 2018-06-06 DIAGNOSIS — Z299 Encounter for prophylactic measures, unspecified: Secondary | ICD-10-CM | POA: Diagnosis not present

## 2018-06-20 NOTE — Progress Notes (Signed)
Office Visit Note  Patient: Tiffany Pollard             Date of Birth: May 10, 1951           MRN: [id number]             PCP: Monico Blitz, MD Referring: Monico Blitz, MD Visit Date: 07/04/2018 Occupation: @GUAROCC @  Subjective:    History of Present Illness: Tiffany Pollard is a 67 y.o. female with history of seropositive rheumatoid arthritis and osteoarthritis.  Patient received Simponi Aria infusions every 8 weeks.  She is due for her next infusion on Wednesday.  She reports that she feels a Simponi Donalda Ewings has been working.  She has some pain in bilateral knee joints but feels it is due to also arthritis.  She denies any joint swelling at this time.  She occasionally has pain in bilateral hands but denies any feet pain at this time.  She states she had increased stress at home which she feels is contributing to some of her discomfort.  She states that she used to walk 30 minutes daily but has been unable to due to the heat as well as recurrent URIs.  She states that since starting on Symphony in December she has had recurrent URIs.  She states that she is currently having ear pain as well as a sore throat and congestion.  She states she is also had a cough and was started on Breo.  She reports that she has also had 2 UTIs in the past 6-week and has been on Bactrim twice.  She states that she would like to discuss other medication options.   Activities of Daily Living:  Patient reports morning stiffness for 2 hours.   Patient Denies nocturnal pain.  Difficulty dressing/grooming: Denies Difficulty climbing stairs: Reports Difficulty getting out of chair: Reports Difficulty using hands for taps, buttons, cutlery, and/or writing: Reports  Review of Systems  Constitutional: Positive for fatigue.  HENT: Positive for ear pain, sore throat and mouth dryness. Negative for mouth sores and nose dryness.   Eyes: Negative for pain, visual disturbance and dryness.  Respiratory: Positive for cough.  Negative for hemoptysis, shortness of breath and difficulty breathing.   Cardiovascular: Negative for chest pain, palpitations, hypertension and swelling in legs/feet.  Gastrointestinal: Negative for blood in stool, constipation and diarrhea.  Endocrine: Negative for increased urination.  Genitourinary: Negative for painful urination.  Musculoskeletal: Positive for arthralgias, joint pain and morning stiffness. Negative for joint swelling, myalgias, muscle weakness, muscle tenderness and myalgias.  Skin: Negative for color change, pallor, rash, hair loss, nodules/bumps, skin tightness, ulcers and sensitivity to sunlight.  Allergic/Immunologic: Negative for susceptible to infections.  Neurological: Negative for dizziness, numbness, headaches and weakness.  Hematological: Negative for swollen glands.  Psychiatric/Behavioral: Negative for depressed mood and sleep disturbance. The patient is nervous/anxious.     PMFS History:  Patient Active Problem List   Diagnosis Date Noted  . Primary osteoarthritis of both knees 04/30/2017  . History of anemia 04/30/2017  . History of migraine 04/30/2017  . History of vertigo 04/30/2017  . History of gastroesophageal reflux (GERD) 04/30/2017  . History of kidney stones 04/30/2017  . History of IBS 04/30/2017  . History of hyperlipidemia 04/30/2017  . Hypercalcemia 04/30/2017  . Rheumatoid arthritis involving multiple sites with positive rheumatoid factor ([place]) 12/08/2016  . High risk medication use 12/08/2016  . Primary osteoarthritis of both hands 12/08/2016  . Primary osteoarthritis of both feet 12/08/2016  . Osteoarthritis  of lumbar spine 12/08/2016  . Former smoker 12/08/2016  . Abdominal pain, chronic, right lower quadrant 12/11/2013    Past Medical History:  Diagnosis Date  . GERD (gastroesophageal reflux disease)   . High blood pressure   . Rheumatoid arthritis(714.0)     History reviewed. No pertinent family history. Past Surgical  History:  Procedure Laterality Date  . BACK SURGERY    . CERVICAL CONIZATION W/BX    . COLONOSCOPY N/A 12/27/2013   Procedure: COLONOSCOPY;  Surgeon: Rogene Houston, MD;  Location: [facility];  Service: Endoscopy;  Laterality: N/A;  320-moved to 125 Ann to notify pt  . FINGER FRACTURE SURGERY    . foot surgery for a bunion    . NASAL SINUS SURGERY    . TONSILLECTOMY     Social History   Social History Narrative  . Not on file    Objective: Vital Signs: BP (!) 120/59 (BP Location: Left Arm, Patient Position: Sitting, Cuff Size: Large)   Pulse 86   Resp 14   Ht 5' 5.5" (1.664 m)   Wt 174 lb (78.9 kg)   BMI 28.51 kg/m    Physical Exam  Constitutional: She is oriented to person, place, and time. She appears well-developed and well-nourished.  HENT:  Head: Normocephalic and atraumatic.  Eyes: Conjunctivae and EOM are normal.  Neck: Normal range of motion.  Cardiovascular: Normal rate, regular rhythm, normal heart sounds and intact distal pulses.  Pulmonary/Chest: Effort normal and breath sounds normal.  Abdominal: Soft. Bowel sounds are normal.  Lymphadenopathy:    She has no cervical adenopathy.  Neurological: She is alert and oriented to person, place, and time.  Skin: Skin is warm and dry. Capillary refill takes less than 2 seconds.  Psychiatric: She has a normal mood and affect. Her behavior is normal.  Nursing note and vitals reviewed.    Musculoskeletal Exam: C-spine slightly limited range of motion with lateral rotation.  Thoracic and lumbar spine good range of motion.  No midline spinal tenderness.  No SI joint tenderness.  Shoulder joints, elbow joints, wrist joints, MCPs, PIPs, DIPs good range of motion no synovitis.  She has PIP and DIP synovial thickening consistent with osteoarthritis of bilateral hands.  She is complete fist formation bilaterally.  Hip joints, knee joints, ankle joints, MTPs, PIPs, DIPs good range of motion no synovitis.  No warmth or effusion  of bilateral knee joints.  Bilateral knee crepitus.  No tenderness along medial joint line.  No Achilles tendinitis.  She has PIP and DIP synovial thickening consistent with osteoarthritis of bilateral feet.  No tenderness of trochanteric bursa bilaterally.  CDAI Exam: CDAI Homunculus Exam:   Joint Counts:  CDAI Tender Joint count: 0 CDAI Swollen Joint count: 0  Global Assessments:  Patient Global Assessment: 3 Provider Global Assessment: 3  CDAI Calculated Score: 6   Investigation: No additional findings.  Imaging: No results found.  Recent Labs: Lab Results  Component Value Date   WBC 6.2 05/11/2018   HGB 12.2 05/11/2018   PLT 171 05/11/2018   NA 140 05/11/2018   K 4.0 05/11/2018   CL 104 05/11/2018   CO2 29 05/11/2018   GLUCOSE 97 05/11/2018   BUN 24 (H) 05/11/2018   CREATININE 0.76 05/11/2018   BILITOT 0.5 05/11/2018   ALKPHOS 82 05/11/2018   AST 34 05/11/2018   ALT 30 05/11/2018   PROT 6.6 05/11/2018   ALBUMIN 3.7 05/11/2018   CALCIUM 9.2 05/11/2018   GFRAA >60  05/11/2018    Speciality Comments: No specialty comments available.  Procedures:  No procedures performed Allergies: Patient has no known allergies.   Assessment / Plan:     Visit Diagnoses: Rheumatoid arthritis involving multiple sites with positive rheumatoid factor (HCC) -  +RF +CCP Ab, -ANA: She has no synovitis on exam.  She has not had any recent rheumatoid arthritis flares.  She has been having some discomfort in bilateral hands which is most likely due to osteoarthritis.  She has PIP and DIP synovial thickening consistent with osteoarthritis of bilateral hands.  She was started on Simponi Aria infusions in December 2018.  She has had recurrent URIs and UTIs since starting on Simponi Aria.  She would like to discuss switching medications.  She was previously on Enbrel which was very effective and did not experience side effects.  Her next infusion is on Wednesday.  She plans on having this  infusion but would like to try another medication in the future.  We discussed the importance of having the pneumonia vaccine as well as Shingrix.  I will discuss treatment options with Dr. Estanislado Pandy, and we will apply through her insurance.  We discussed switched to Orenica IV infusions.  She will return for consent once the medication is decided upon.   High risk medication use - Simponi Aria IV.  TB gold was negative on 03/16/2018.  05/11/2018 CBC and CMP are drawn.  She will have lab work in September and every 3 months to monitor for drug toxicity.  Primary osteoarthritis of both hands: PIP and DIP synovial thickening consistent with osteoarthritis of bilateral hands.  She is complete fist formation bilaterally.  Joint protection and muscle strengthening were discussed.  Primary osteoarthritis of both knees: No warmth or effusion.  She has bilateral knee crepitus.  She has been having some discomfort in bilateral knees.  She is to walk 30 minutes/day but is unable to due to recurrent URIs.  Primary osteoarthritis of both feet: She has PIP and DIP synovial thickening consistent with osteoarthritis of bilateral feet.  She has no discomfort in her feet at this time.  The importance of wearing proper fitting shoes was discussed.  Spondylosis of lumbar region without myelopathy or radiculopathy: No midline spinal tenderness.  Other medical conditions are listed as follows:  Former smoker  History of vertigo   Orders: No orders of the defined types were placed in this encounter.  No orders of the defined types were placed in this encounter.   Face-to-face time spent with patient was 30 minutes. Greater than 50% of time was spent in counseling and coordination of care.  Follow-Up Instructions: Return in about 3 months (around 10/04/2018) for Rheumatoid arthritis, Osteoarthritis.   Ofilia Neas, PA-C  Note - This record has been created using Dragon software.  Chart creation errors have  been sought, but may not always  have been located. Such creation errors do not reflect on  the standard of medical care.

## 2018-06-22 DIAGNOSIS — R350 Frequency of micturition: Secondary | ICD-10-CM | POA: Diagnosis not present

## 2018-06-22 DIAGNOSIS — N390 Urinary tract infection, site not specified: Secondary | ICD-10-CM | POA: Diagnosis not present

## 2018-06-22 DIAGNOSIS — K589 Irritable bowel syndrome without diarrhea: Secondary | ICD-10-CM | POA: Diagnosis not present

## 2018-06-22 DIAGNOSIS — Z6828 Body mass index (BMI) 28.0-28.9, adult: Secondary | ICD-10-CM | POA: Diagnosis not present

## 2018-06-22 DIAGNOSIS — Z299 Encounter for prophylactic measures, unspecified: Secondary | ICD-10-CM | POA: Diagnosis not present

## 2018-06-22 DIAGNOSIS — E7800 Pure hypercholesterolemia, unspecified: Secondary | ICD-10-CM | POA: Diagnosis not present

## 2018-06-22 DIAGNOSIS — I10 Essential (primary) hypertension: Secondary | ICD-10-CM | POA: Diagnosis not present

## 2018-06-22 DIAGNOSIS — M069 Rheumatoid arthritis, unspecified: Secondary | ICD-10-CM | POA: Diagnosis not present

## 2018-06-22 DIAGNOSIS — F329 Major depressive disorder, single episode, unspecified: Secondary | ICD-10-CM | POA: Diagnosis not present

## 2018-06-29 DIAGNOSIS — R05 Cough: Secondary | ICD-10-CM | POA: Diagnosis not present

## 2018-07-04 ENCOUNTER — Telehealth: Payer: Self-pay | Admitting: Physician Assistant

## 2018-07-04 ENCOUNTER — Encounter: Payer: Self-pay | Admitting: Physician Assistant

## 2018-07-04 ENCOUNTER — Ambulatory Visit (INDEPENDENT_AMBULATORY_CARE_PROVIDER_SITE_OTHER): Payer: Medicare Other | Admitting: Physician Assistant

## 2018-07-04 VITALS — BP 120/59 | HR 86 | Resp 14 | Ht 65.5 in | Wt 174.0 lb

## 2018-07-04 DIAGNOSIS — M170 Bilateral primary osteoarthritis of knee: Secondary | ICD-10-CM

## 2018-07-04 DIAGNOSIS — M19042 Primary osteoarthritis, left hand: Secondary | ICD-10-CM | POA: Diagnosis not present

## 2018-07-04 DIAGNOSIS — M0579 Rheumatoid arthritis with rheumatoid factor of multiple sites without organ or systems involvement: Secondary | ICD-10-CM

## 2018-07-04 DIAGNOSIS — Z87898 Personal history of other specified conditions: Secondary | ICD-10-CM

## 2018-07-04 DIAGNOSIS — Z87891 Personal history of nicotine dependence: Secondary | ICD-10-CM

## 2018-07-04 DIAGNOSIS — M47816 Spondylosis without myelopathy or radiculopathy, lumbar region: Secondary | ICD-10-CM

## 2018-07-04 DIAGNOSIS — M19041 Primary osteoarthritis, right hand: Secondary | ICD-10-CM

## 2018-07-04 DIAGNOSIS — M19072 Primary osteoarthritis, left ankle and foot: Secondary | ICD-10-CM | POA: Diagnosis not present

## 2018-07-04 DIAGNOSIS — M19071 Primary osteoarthritis, right ankle and foot: Secondary | ICD-10-CM

## 2018-07-04 DIAGNOSIS — Z79899 Other long term (current) drug therapy: Secondary | ICD-10-CM | POA: Diagnosis not present

## 2018-07-04 NOTE — Patient Instructions (Signed)
Standing Labs We placed an order today for your standing lab work.    Please come back and get your standing labs in September and every 3 months   We have open lab Monday through Friday from 8:30-11:30 AM and 1:30-4:00 PM  at the office of Dr. Shaili Deveshwar.   You may experience shorter wait times on Monday and Friday afternoons. The office is located at [address], [address], [place], [place] [address] No appointment is necessary.   Labs are drawn by [facility].  You may receive a bill from [facility] for your lab work. If you have any questions regarding directions or hours of operation,  please call [phone].    

## 2018-07-04 NOTE — Telephone Encounter (Signed)
I called patient to discuss medication options.  She is going to proceed with her Simponi Aria IV infusion on Wednesday.  I spoke with Dr. Tempie Hoist earlier today and she is in agreement to proceed with applying for Orencia IV infusions. Dolores Lory is currently working on a prior authorization for Orencia infusions.  Once the medication is approved we will have the patient return to the office to discuss indications, contraindications, potential side effects.  We discussed information about [facility] at her visit and briefly on the phone.  Consent will be obtained when she returns to the office.  She is in agreement with this plan.  All questions were addressed.

## 2018-07-05 ENCOUNTER — Other Ambulatory Visit: Payer: Self-pay | Admitting: *Deleted

## 2018-07-05 ENCOUNTER — Telehealth: Payer: Self-pay | Admitting: Rheumatology

## 2018-07-05 DIAGNOSIS — M0579 Rheumatoid arthritis with rheumatoid factor of multiple sites without organ or systems involvement: Secondary | ICD-10-CM

## 2018-07-05 NOTE — Telephone Encounter (Signed)
Orders placed.

## 2018-07-05 NOTE — Telephone Encounter (Signed)
Richarda Osmond from [facility] called requesting orders for patient's Simponi infusion scheduled for tomorrow 07/06/18 at 8:00 am.  If you have any questions, please call [phone]

## 2018-07-06 ENCOUNTER — Encounter (HOSPITAL_COMMUNITY)
Admission: RE | Admit: 2018-07-06 | Discharge: 2018-07-06 | Disposition: A | Discharge status: Home / Self Care | Payer: Medicare Other | Source: Ambulatory Visit | Attending: Rheumatology | Admitting: Rheumatology

## 2018-07-06 DIAGNOSIS — M0579 Rheumatoid arthritis with rheumatoid factor of multiple sites without organ or systems involvement: Secondary | ICD-10-CM | POA: Diagnosis not present

## 2018-07-06 LAB — CBC
HCT: 39.1 % (ref 36.0–46.0)
Hemoglobin: 12.3 g/dL (ref 12.0–15.0)
MCH: 27.8 pg (ref 26.0–34.0)
MCHC: 31.5 g/dL (ref 30.0–36.0)
MCV: 88.5 fL (ref 78.0–100.0)
PLATELETS: 148 10*3/uL — AB (ref 150–400)
RBC: 4.42 MIL/uL (ref 3.87–5.11)
RDW: 14 % (ref 11.5–15.5)
WBC: 6 10*3/uL (ref 4.0–10.5)

## 2018-07-06 LAB — COMPREHENSIVE METABOLIC PANEL
ALT: 39 U/L (ref 0–44)
ANION GAP: 7 (ref 5–15)
AST: 37 U/L (ref 15–41)
Albumin: 3.7 g/dL (ref 3.5–5.0)
Alkaline Phosphatase: 65 U/L (ref 38–126)
BUN: 20 mg/dL (ref 8–23)
CALCIUM: 9.2 mg/dL (ref 8.9–10.3)
CHLORIDE: 105 mmol/L (ref 98–111)
CO2: 28 mmol/L (ref 22–32)
CREATININE: 0.69 mg/dL (ref 0.44–1.00)
Glucose, Bld: 103 mg/dL — ABNORMAL HIGH (ref 70–99)
Potassium: 4.1 mmol/L (ref 3.5–5.1)
SODIUM: 140 mmol/L (ref 135–145)
Total Bilirubin: 0.4 mg/dL (ref 0.3–1.2)
Total Protein: 6.6 g/dL (ref 6.5–8.1)

## 2018-07-06 MED ORDER — ACETAMINOPHEN 325 MG PO TABS: 650.0000 mg | ORAL_TABLET | ORAL | Status: DC

## 2018-07-06 MED ORDER — SODIUM CHLORIDE 0.9 % IV SOLN
2.0000 mg/kg | INTRAVENOUS | Status: DC
  Administered 2018-07-06: 157.5 mg via INTRAVENOUS
  Filled 2018-07-06: qty 12.6

## 2018-07-06 MED ORDER — SODIUM CHLORIDE 0.9 % IV SOLN
INTRAVENOUS | Status: DC
  Administered 2018-07-06: 250 mL via INTRAVENOUS

## 2018-07-06 MED ORDER — DIPHENHYDRAMINE HCL 25 MG PO CAPS: 25.0000 mg | ORAL_CAPSULE | ORAL | Status: DC

## 2018-07-06 NOTE — Progress Notes (Signed)
Platelets are mildly decreased.  We will continue to monitor.

## 2018-07-06 NOTE — Progress Notes (Signed)
Pt states that she took her benadryl and tylenol at home before coming to hospital.

## 2018-07-06 NOTE — Telephone Encounter (Signed)
Please notify patient that she is approved for Orenica infusions and her copay will be 100% covered at [facility].  Please advise patient to come by the office to discuss indications, contraindications, and potential side effects of Orencia.  Consent will be obtained at that time.  She had her last Simponi Aria infusion today, so she cannot start on Orencia for 1 month.

## 2018-07-06 NOTE — Telephone Encounter (Signed)
Called and confirmed 2019 benefits are still the same and active. Patient has Medicare (A&B) and AARP Medicare Supplement. Pre-Certification is not required for Medicare. Patient has met her $27 deductible with Medicare. It will cover 80% of the cost of outpatient infusion services and medication. Patient will be responsible for the remaining 20%. Her AARP supplemental plan will not require a pre-certification. It will pay 100% of the cost of the part B deductible and remaining balance. According to the Medicare rep and automated systems of AARP Supplement, patient should be covered at 100% for outpatient infusion services at [facility] for Maureen Chatters [facility]- [id number] and CPT code [phone]).  Medicare phone#- [phone] [place] Ref# [id number]  AARP phone#- [phone]  10:04 AM Beatriz Chancellor, CPhT

## 2018-07-07 NOTE — Telephone Encounter (Signed)
Called patient and provided information below. Patient will come into the office tomorrow afternoon to discuss indications, contraindications and potential side effects of the medication. We will also obtain consent.

## 2018-07-08 ENCOUNTER — Other Ambulatory Visit: Payer: Self-pay

## 2018-07-08 ENCOUNTER — Telehealth: Payer: Self-pay | Admitting: Rheumatology

## 2018-07-08 NOTE — Telephone Encounter (Signed)
Returned patient's call and patient will be coming by the office around 10:00 this morning.

## 2018-07-08 NOTE — Telephone Encounter (Signed)
Patient called asking if she can come by the office this morning instead of 2:30 pm to fill out the "agreement form for the Orencia."  Patient requested a return call.

## 2018-07-08 NOTE — Progress Notes (Signed)
Counseled patient that Tiffany Pollard is a selective T-cell costimulation blocker.  Counseled patient on purpose, proper use, and adverse effects of Orencia. The most common adverse effects are increased risk of infections, headache, and injection site reactions or infusion reactions.  There is the possibility of an increased risk of malignancy but it is not well understood if this increased risk is due to the medication or the disease state.  Reviewed the importance of regular labs while on Orencia.  Counseled patient that Tiffany Pollard should be held prior to scheduled surgery.  Counseled patient to avoid live vaccines while on Orencia.  Advised patient to get annual influenza vaccine and the pneumococcal vaccine as indicated.  Provided patient with medication education material and answered all questions.  Patient consented to [facility].  Will upload consent into patient's chart.  Will submit benefits investigation for Orencia.

## 2018-07-11 ENCOUNTER — Telehealth: Payer: Self-pay | Admitting: Rheumatology

## 2018-07-11 DIAGNOSIS — I10 Essential (primary) hypertension: Secondary | ICD-10-CM | POA: Diagnosis not present

## 2018-07-11 DIAGNOSIS — F329 Major depressive disorder, single episode, unspecified: Secondary | ICD-10-CM | POA: Diagnosis not present

## 2018-07-11 DIAGNOSIS — Z299 Encounter for prophylactic measures, unspecified: Secondary | ICD-10-CM | POA: Diagnosis not present

## 2018-07-11 DIAGNOSIS — Z713 Dietary counseling and surveillance: Secondary | ICD-10-CM | POA: Diagnosis not present

## 2018-07-11 NOTE — Telephone Encounter (Signed)
Thank you for informing me.

## 2018-07-11 NOTE — Telephone Encounter (Signed)
Patient called stating she came into the office on Friday 07/08/18 to sign a consent form for the Orencia infusions, but on Saturday 07/09/18 received a letter from [employer] stating she had been denied to receive the medication.  Patient wants to make sure everything is okay and that the infusion is filed under her "Medicare hospital part and not under the drug part. "  Patient requested a return call.

## 2018-07-11 NOTE — Telephone Encounter (Signed)
Called patient and confirmed that Tiffany Pollard will be covered through her Medicare part B 80%, and the remaining 20% will be covered by her [place]. Both benefits have been verified and confirmed. Patient had no other questions at this time.  9:11 AM Beatriz Chancellor, CPhT

## 2018-07-11 NOTE — Telephone Encounter (Signed)
Patient called stating she saw her PCP Dr. Brigitte Pulse at [facility] today and she had a Pulmonary Function Test.  Patient states she was told the findings are consistent with asthma.  Patient states Dr. Brigitte Pulse will send a copy of the results to Dr. Estanislado Pandy tomorrow.

## 2018-07-13 DIAGNOSIS — Z23 Encounter for immunization: Secondary | ICD-10-CM | POA: Diagnosis not present

## 2018-07-20 ENCOUNTER — Telehealth: Payer: Self-pay | Admitting: Rheumatology

## 2018-07-20 DIAGNOSIS — H04123 Dry eye syndrome of bilateral lacrimal glands: Secondary | ICD-10-CM | POA: Diagnosis not present

## 2018-07-20 DIAGNOSIS — H2513 Age-related nuclear cataract, bilateral: Secondary | ICD-10-CM | POA: Diagnosis not present

## 2018-07-20 DIAGNOSIS — D3132 Benign neoplasm of left choroid: Secondary | ICD-10-CM | POA: Diagnosis not present

## 2018-07-20 DIAGNOSIS — H33103 Unspecified retinoschisis, bilateral: Secondary | ICD-10-CM | POA: Diagnosis not present

## 2018-07-20 NOTE — Telephone Encounter (Signed)
Patient called stating she called [facility] to schedule her infusion and was told that they have not received the orders for her Orencia infusion.  Patient requested a return call.

## 2018-07-25 ENCOUNTER — Other Ambulatory Visit: Payer: Self-pay | Admitting: *Deleted

## 2018-07-25 DIAGNOSIS — M0579 Rheumatoid arthritis with rheumatoid factor of multiple sites without organ or systems involvement: Secondary | ICD-10-CM

## 2018-07-25 NOTE — Telephone Encounter (Signed)
Patient advised infusion orders placed.

## 2018-08-03 ENCOUNTER — Encounter (HOSPITAL_COMMUNITY): Payer: Self-pay

## 2018-08-03 ENCOUNTER — Encounter (HOSPITAL_COMMUNITY)
Admission: RE | Admit: 2018-08-03 | Discharge: 2018-08-03 | Disposition: A | Discharge status: Home / Self Care | Payer: Medicare Other | Source: Ambulatory Visit | Attending: Rheumatology | Admitting: Rheumatology

## 2018-08-03 DIAGNOSIS — M0579 Rheumatoid arthritis with rheumatoid factor of multiple sites without organ or systems involvement: Secondary | ICD-10-CM | POA: Diagnosis not present

## 2018-08-03 MED ORDER — SODIUM CHLORIDE 0.9 % IV SOLN
750.0000 mg | INTRAVENOUS | Status: DC
  Administered 2018-08-03: 750 mg via INTRAVENOUS
  Filled 2018-08-03: qty 30

## 2018-08-03 MED ORDER — DIPHENHYDRAMINE HCL 25 MG PO CAPS: 25.0000 mg | ORAL_CAPSULE | ORAL | Status: DC

## 2018-08-03 MED ORDER — SODIUM CHLORIDE 0.9 % IV SOLN
INTRAVENOUS | Status: DC
  Administered 2018-08-03: 08:00:00 via INTRAVENOUS

## 2018-08-03 MED ORDER — ACETAMINOPHEN 325 MG PO TABS: 650.0000 mg | ORAL_TABLET | ORAL | Status: DC

## 2018-08-03 NOTE — Progress Notes (Signed)
Patient tolerated infusion well without s/s of reaction, as this is her first infusion. She was monitored for 30 minutes post.  Written material provided, along with discussion regarding medicaion.  Verbalized understanding.

## 2018-08-03 NOTE — Discharge Instructions (Signed)
Abatacept solution for injection (subcutaneous or intravenous use)  What is this medicine?  ABATACEPT (a ba TA sept) is used to treat moderate to severe active rheumatoid arthritis or psoriatic arthritis in adults. This medicine is also used to treat juvenile idiopathic arthritis.  This medicine may be used for other purposes; ask your health care provider or pharmacist if you have questions.  COMMON BRAND NAME(S): Orencia  What should I tell my health care provider before I take this medicine?  They need to know if you have any of these conditions:  -are taking other medicines to treat rheumatoid arthritis  -COPD  -diabetes  -infection or history of infections  -recently received or scheduled to receive a vaccine  -scheduled to have surgery  -tuberculosis, a positive skin test for tuberculosis or have recently been in close contact with someone who has tuberculosis  -viral hepatitis  -an unusual or allergic reaction to abatacept, other medicines, foods, dyes, or preservatives  -pregnant or trying to get pregnant  -breast-feeding  How should I use this medicine?  This medicine is for infusion into a vein or for injection under the skin. Infusions are given by a health care professional in a hospital or clinic setting. If you are to give your own medicine at home, you will be taught how to prepare and give this medicine under the skin. Use exactly as directed. Take your medicine at regular intervals. Do not take your medicine more often than directed.  It is important that you put your used needles and syringes in a special sharps container. Do not put them in a trash can. If you do not have a sharps container, call your pharmacist or healthcare provider to get one.  Talk to your pediatrician regarding the use of this medicine in children. While infusions in a clinic may be prescribed for children as young as 2 years for selected conditions, precautions do apply.  Overdosage: If you think you have taken too much of  this medicine contact a poison control center or emergency room at once.  NOTE: This medicine is only for you. Do not share this medicine with others.  What if I miss a dose?  This medicine is used once a week if given by injection under the skin. If you miss a dose, take it as soon as you can. If it is almost time for your next dose, take only that dose. Do not take double or extra doses.  If you are to be given an infusion, it is important not to miss your dose. Doses are usually every 4 weeks. Call your doctor or health care professional if you are unable to keep an appointment.  What may interact with this medicine?  Do not take this medicine with any of the following medications:  -adalimumab  -anakinra  -certolizumab  -etanercept  -golimumab  -infliximab  -live virus vaccines  -rituximab  -tocilizumab  This medicine may also interact with the following medications:  -vaccines  This list may not describe all possible interactions. Give your health care provider a list of all the medicines, herbs, non-prescription drugs, or dietary supplements you use. Also tell them if you smoke, drink alcohol, or use illegal drugs. Some items may interact with your medicine.  What should I watch for while using this medicine?  Visit your doctor for regular check ups while you are taking this medicine. Tell your doctor or healthcare professional if your symptoms do not start to get better or if they   get worse.  Call your doctor or health care professional if you get a cold or other infection while receiving this medicine. Do not treat yourself. This medicine may decrease your body's ability to fight infection. Try to avoid being around people who are sick.  What side effects may I notice from receiving this medicine?  Side effects that you should report to your doctor or health care professional as soon as possible:  -allergic reactions like skin rash, itching or hives, swelling of the face, lips, or tongue  -breathing  problems  -chest pain  -signs of infection - fever or chills, cough, unusual tiredness, pain or trouble passing urine, or warm, red or painful skin  Side effects that usually do not require medical attention (report to your doctor or health care professional if they continue or are bothersome):  -dizziness  -headache  -nausea, vomiting  -sore throat  -stomach upset  This list may not describe all possible side effects. Call your doctor for medical advice about side effects. You may report side effects to FDA at 1-800-FDA-1088.  Where should I keep my medicine?  Infusions will be given in a hospital or clinic and will not be stored at home.  Storage for syringes given under the skin and stored at home:  Keep out of the reach of children. Store in a refrigerator between 2 and 8 degrees C (36 and 46 degrees F). Keep this medicine in the original container. Protect from light. Do not freeze. Throw away any unused medicine after the expiration date.  NOTE: This sheet is a summary. It may not cover all possible information. If you have questions about this medicine, talk to your doctor, pharmacist, or health care provider.   2018 Elsevier/Gold Standard (2016-06-11 10:07:35)

## 2018-08-05 DIAGNOSIS — Z6828 Body mass index (BMI) 28.0-28.9, adult: Secondary | ICD-10-CM | POA: Diagnosis not present

## 2018-08-05 DIAGNOSIS — M069 Rheumatoid arthritis, unspecified: Secondary | ICD-10-CM | POA: Diagnosis not present

## 2018-08-05 DIAGNOSIS — Z299 Encounter for prophylactic measures, unspecified: Secondary | ICD-10-CM | POA: Diagnosis not present

## 2018-08-05 DIAGNOSIS — I10 Essential (primary) hypertension: Secondary | ICD-10-CM | POA: Diagnosis not present

## 2018-08-05 DIAGNOSIS — B029 Zoster without complications: Secondary | ICD-10-CM | POA: Diagnosis not present

## 2018-08-17 ENCOUNTER — Encounter (HOSPITAL_COMMUNITY)
Admission: RE | Admit: 2018-08-17 | Discharge: 2018-08-17 | Disposition: A | Discharge status: Home / Self Care | Payer: Medicare Other | Source: Ambulatory Visit | Attending: Rheumatology | Admitting: Rheumatology

## 2018-08-17 ENCOUNTER — Inpatient Hospital Stay (HOSPITAL_COMMUNITY): Admission: RE | Admit: 2018-08-17 | Payer: Medicare Other | Source: Ambulatory Visit

## 2018-08-17 DIAGNOSIS — M0579 Rheumatoid arthritis with rheumatoid factor of multiple sites without organ or systems involvement: Secondary | ICD-10-CM | POA: Diagnosis not present

## 2018-08-17 MED ORDER — SODIUM CHLORIDE 0.9 % IV SOLN: 2.0000 mg/kg | Freq: Once | INTRAVENOUS | Status: DC

## 2018-08-17 MED ORDER — DIPHENHYDRAMINE HCL 25 MG PO CAPS: 25.0000 mg | ORAL_CAPSULE | ORAL | Status: DC

## 2018-08-17 MED ORDER — ACETAMINOPHEN 325 MG PO TABS: 650.0000 mg | ORAL_TABLET | ORAL | Status: DC

## 2018-08-17 MED ORDER — SODIUM CHLORIDE 0.9 % IV SOLN
750.0000 mg | INTRAVENOUS | Status: DC
  Administered 2018-08-17: 750 mg via INTRAVENOUS
  Filled 2018-08-17: qty 30

## 2018-08-17 MED ORDER — SODIUM CHLORIDE 0.9 % IV SOLN: INTRAVENOUS | Status: DC

## 2018-08-17 MED ORDER — SODIUM CHLORIDE 0.9 % IV SOLN
Freq: Once | INTRAVENOUS | Status: AC
  Administered 2018-08-17: 10:00:00 via INTRAVENOUS

## 2018-08-17 NOTE — Progress Notes (Signed)
Pt states took benadryl and tylenol at home before coming to hospital.

## 2018-08-19 DIAGNOSIS — F329 Major depressive disorder, single episode, unspecified: Secondary | ICD-10-CM | POA: Diagnosis not present

## 2018-08-19 DIAGNOSIS — Z6828 Body mass index (BMI) 28.0-28.9, adult: Secondary | ICD-10-CM | POA: Diagnosis not present

## 2018-08-19 DIAGNOSIS — N12 Tubulo-interstitial nephritis, not specified as acute or chronic: Secondary | ICD-10-CM | POA: Diagnosis not present

## 2018-08-19 DIAGNOSIS — R350 Frequency of micturition: Secondary | ICD-10-CM | POA: Diagnosis not present

## 2018-08-19 DIAGNOSIS — Z299 Encounter for prophylactic measures, unspecified: Secondary | ICD-10-CM | POA: Diagnosis not present

## 2018-08-19 DIAGNOSIS — I10 Essential (primary) hypertension: Secondary | ICD-10-CM | POA: Diagnosis not present

## 2018-08-25 ENCOUNTER — Telehealth: Payer: Self-pay | Admitting: Rheumatology

## 2018-08-25 NOTE — Telephone Encounter (Signed)
Patient called stating she was diagnosed with a kidney infection and has been on Levaquin for the past 10 days.  Patient states she will take her last dose on 08/28/18 and is scheduled for her Orencia infusion on 08/31/18.  Patient is checking to see if she can keep that appointment or if she needs to reschedule it.

## 2018-08-25 NOTE — Telephone Encounter (Signed)
Patient advised that she may have her Orencia infusion as long as she has completed the Levaquin and her infection has cleared. Patient verbalized understanding.

## 2018-08-31 ENCOUNTER — Encounter (HOSPITAL_COMMUNITY)
Admission: RE | Admit: 2018-08-31 | Discharge: 2018-08-31 | Disposition: A | Discharge status: Home / Self Care | Payer: Medicare Other | Source: Ambulatory Visit | Attending: Rheumatology | Admitting: Rheumatology

## 2018-08-31 ENCOUNTER — Encounter (HOSPITAL_COMMUNITY): Payer: Medicare Other

## 2018-08-31 ENCOUNTER — Encounter (HOSPITAL_COMMUNITY): Payer: Self-pay

## 2018-08-31 ENCOUNTER — Other Ambulatory Visit (HOSPITAL_COMMUNITY): Payer: Medicare Other

## 2018-08-31 DIAGNOSIS — M0579 Rheumatoid arthritis with rheumatoid factor of multiple sites without organ or systems involvement: Secondary | ICD-10-CM | POA: Diagnosis not present

## 2018-08-31 LAB — CBC
HEMATOCRIT: 40.8 % (ref 36.0–46.0)
Hemoglobin: 13.1 g/dL (ref 12.0–15.0)
MCH: 28.1 pg (ref 26.0–34.0)
MCHC: 32.1 g/dL (ref 30.0–36.0)
MCV: 87.6 fL (ref 78.0–100.0)
Platelets: 200 10*3/uL (ref 150–400)
RBC: 4.66 MIL/uL (ref 3.87–5.11)
RDW: 14.1 % (ref 11.5–15.5)
WBC: 7.8 10*3/uL (ref 4.0–10.5)

## 2018-08-31 LAB — COMPREHENSIVE METABOLIC PANEL
ALT: 43 U/L (ref 0–44)
AST: 44 U/L — AB (ref 15–41)
Albumin: 4 g/dL (ref 3.5–5.0)
Alkaline Phosphatase: 76 U/L (ref 38–126)
Anion gap: 9 (ref 5–15)
BUN: 15 mg/dL (ref 8–23)
CHLORIDE: 103 mmol/L (ref 98–111)
CO2: 29 mmol/L (ref 22–32)
CREATININE: 0.67 mg/dL (ref 0.44–1.00)
Calcium: 9.6 mg/dL (ref 8.9–10.3)
GFR calc Af Amer: >60 mL/min (ref >60)
GFR calc non Af Amer: >60 mL/min (ref >60)
Glucose, Bld: 72 mg/dL (ref 70–99)
POTASSIUM: 4.4 mmol/L (ref 3.5–5.1)
Sodium: 141 mmol/L (ref 135–145)
Total Bilirubin: 0.6 mg/dL (ref 0.3–1.2)
Total Protein: 6.4 g/dL — ABNORMAL LOW (ref 6.5–8.1)

## 2018-08-31 MED ORDER — DIPHENHYDRAMINE HCL 25 MG PO CAPS: 25.0000 mg | ORAL_CAPSULE | Freq: Once | ORAL | Status: DC

## 2018-08-31 MED ORDER — SODIUM CHLORIDE 0.9 % IV SOLN
750.0000 mg | INTRAVENOUS | Status: DC
  Administered 2018-08-31: 750 mg via INTRAVENOUS
  Filled 2018-08-31: qty 30

## 2018-08-31 MED ORDER — SODIUM CHLORIDE 0.9 % IV SOLN
Freq: Once | INTRAVENOUS | Status: AC
  Administered 2018-08-31: 08:00:00 via INTRAVENOUS

## 2018-08-31 MED ORDER — ACETAMINOPHEN 325 MG PO TABS: 650.0000 mg | ORAL_TABLET | Freq: Once | ORAL | Status: DC

## 2018-08-31 NOTE — Progress Notes (Signed)
LFTs are mildly elevated.  Please advise patient to avoid NSAIDs and Tylenol.

## 2018-09-20 NOTE — Progress Notes (Deleted)
Office Visit Note  Patient: Tiffany Pollard             Date of Birth: 1951/07/28           MRN: [id number]             PCP: Monico Blitz, MD Referring: Monico Blitz, MD Visit Date: 10/04/2018 Occupation: @GUAROCC @  Subjective:  No chief complaint on file.   History of Present Illness: Tiffany Pollard is a 67 y.o. female ***   Activities of Daily Living:  Patient reports morning stiffness for *** {minute/hour:19697}.   Patient {ACTIONS;DENIES/REPORTS:21021675::"Denies"} nocturnal pain.  Difficulty dressing/grooming: {ACTIONS;DENIES/REPORTS:21021675::"Denies"} Difficulty climbing stairs: {ACTIONS;DENIES/REPORTS:21021675::"Denies"} Difficulty getting out of chair: {ACTIONS;DENIES/REPORTS:21021675::"Denies"} Difficulty using hands for taps, buttons, cutlery, and/or writing: {ACTIONS;DENIES/REPORTS:21021675::"Denies"}  No Rheumatology ROS completed.   PMFS History:  Patient Active Problem List   Diagnosis Date Noted  . Primary osteoarthritis of both knees 04/30/2017  . History of anemia 04/30/2017  . History of migraine 04/30/2017  . History of vertigo 04/30/2017  . History of gastroesophageal reflux (GERD) 04/30/2017  . History of kidney stones 04/30/2017  . History of IBS 04/30/2017  . History of hyperlipidemia 04/30/2017  . Hypercalcemia 04/30/2017  . Rheumatoid arthritis involving multiple sites with positive rheumatoid factor ([place]) 12/08/2016  . High risk medication use 12/08/2016  . Primary osteoarthritis of both hands 12/08/2016  . Primary osteoarthritis of both feet 12/08/2016  . Osteoarthritis of lumbar spine 12/08/2016  . Former smoker 12/08/2016  . Abdominal pain, chronic, right lower quadrant 12/11/2013    Past Medical History:  Diagnosis Date  . GERD (gastroesophageal reflux disease)   . High blood pressure   . Rheumatoid arthritis(714.0)     No family history on file. Past Surgical History:  Procedure Laterality Date  . BACK SURGERY    . CERVICAL  CONIZATION W/BX    . COLONOSCOPY N/A 12/27/2013   Procedure: COLONOSCOPY;  Surgeon: Rogene Houston, MD;  Location: [facility];  Service: Endoscopy;  Laterality: N/A;  320-moved to 125 Ann to notify pt  . FINGER FRACTURE SURGERY    . foot surgery for a bunion    . NASAL SINUS SURGERY    . TONSILLECTOMY     Social History   Social History Narrative  . Not on file   Immunization History  Administered Date(s) Administered  . Influenza,inj,Quad PF,6+ Mos 08/18/2017  . Pneumococcal Conjugate-13 07/13/2018   Objective: Vital Signs: There were no vitals taken for this visit.   Physical Exam   Musculoskeletal Exam: ***  CDAI Exam: CDAI Score: Not documented Patient Global Assessment: Not documented; Provider Global Assessment: Not documented Swollen: Not documented; Tender: Not documented Joint Exam   Not documented   There is currently no information documented on the homunculus. Go to the Rheumatology activity and complete the homunculus joint exam.  Investigation: No additional findings.  Imaging: No results found.  Recent Labs: Lab Results  Component Value Date   WBC 7.8 08/31/2018   HGB 13.1 08/31/2018   PLT 200 08/31/2018   NA 141 08/31/2018   K 4.4 08/31/2018   CL 103 08/31/2018   CO2 29 08/31/2018   GLUCOSE 72 08/31/2018   BUN 15 08/31/2018   CREATININE 0.67 08/31/2018   BILITOT 0.6 08/31/2018   ALKPHOS 76 08/31/2018   AST 44 (H) 08/31/2018   ALT 43 08/31/2018   PROT 6.4 (L) 08/31/2018   ALBUMIN 4.0 08/31/2018   CALCIUM 9.6 08/31/2018   GFRAA >60 08/31/2018   QFTBGOLD  Negative 03/11/2017   QFTBGOLDPLUS Negative 03/16/2018    Speciality Comments: No specialty comments available.  Procedures:  No procedures performed Allergies: Patient has no known allergies.   Assessment / Plan:      Current regimen includes Simponi IV (last infusion 08/31/18).  Last TB gold 03/16/18.  Recommend annual flu, Pneumovax 23, and Shingrix.  Visit Diagnoses:  Rheumatoid arthritis involving multiple sites with positive rheumatoid factor (HCC) - +RF +CCP Ab, -ANA  High risk medication use - Simponi Aria IVTB gold negative 03/16/18.  Primary osteoarthritis of both hands  Primary osteoarthritis of both knees  Primary osteoarthritis of both feet  Spondylosis of lumbar region without myelopathy or radiculopathy  History of vertigo  History of anemia  History of migraine  History of gastroesophageal reflux (GERD)  Former smoker  History of kidney stones  History of hyperlipidemia  History of IBS   Orders: No orders of the defined types were placed in this encounter.  No orders of the defined types were placed in this encounter.   Face-to-face time spent with patient was *** minutes. Greater than 50% of time was spent in counseling and coordination of care.  Follow-Up Instructions: No follow-ups on file.   Ofilia Neas, PA-C  Note - This record has been created using Dragon software.  Chart creation errors have been sought, but may not always  have been located. Such creation errors do not reflect on  the standard of medical care.

## 2018-09-21 DIAGNOSIS — Z299 Encounter for prophylactic measures, unspecified: Secondary | ICD-10-CM | POA: Diagnosis not present

## 2018-09-21 DIAGNOSIS — Z1211 Encounter for screening for malignant neoplasm of colon: Secondary | ICD-10-CM | POA: Diagnosis not present

## 2018-09-21 DIAGNOSIS — Z23 Encounter for immunization: Secondary | ICD-10-CM | POA: Diagnosis not present

## 2018-09-21 DIAGNOSIS — Z1339 Encounter for screening examination for other mental health and behavioral disorders: Secondary | ICD-10-CM | POA: Diagnosis not present

## 2018-09-21 DIAGNOSIS — E559 Vitamin D deficiency, unspecified: Secondary | ICD-10-CM | POA: Diagnosis not present

## 2018-09-21 DIAGNOSIS — Z1331 Encounter for screening for depression: Secondary | ICD-10-CM | POA: Diagnosis not present

## 2018-09-21 DIAGNOSIS — I10 Essential (primary) hypertension: Secondary | ICD-10-CM | POA: Diagnosis not present

## 2018-09-21 DIAGNOSIS — Z79899 Other long term (current) drug therapy: Secondary | ICD-10-CM | POA: Diagnosis not present

## 2018-09-21 DIAGNOSIS — Z6828 Body mass index (BMI) 28.0-28.9, adult: Secondary | ICD-10-CM | POA: Diagnosis not present

## 2018-09-21 DIAGNOSIS — Z0000 Encounter for general adult medical examination without abnormal findings: Secondary | ICD-10-CM | POA: Diagnosis not present

## 2018-09-21 DIAGNOSIS — R5383 Other fatigue: Secondary | ICD-10-CM | POA: Diagnosis not present

## 2018-09-21 DIAGNOSIS — Z7189 Other specified counseling: Secondary | ICD-10-CM | POA: Diagnosis not present

## 2018-09-21 DIAGNOSIS — E7800 Pure hypercholesterolemia, unspecified: Secondary | ICD-10-CM | POA: Diagnosis not present

## 2018-10-04 ENCOUNTER — Ambulatory Visit: Payer: Medicare Other | Admitting: Physician Assistant

## 2018-10-04 NOTE — Progress Notes (Signed)
Office Visit Note  Patient: Tiffany Pollard             Date of Birth: 04/12/1951           MRN: [id number]             PCP: Monico Blitz, MD Referring: Monico Blitz, MD Visit Date: 10/12/2018 Occupation: @GUAROCC @  Subjective:  Pain in multiple joints   History of Present Illness: Tiffany Pollard is a 67 y.o. female with history of seropositive rheumatoid arthritis and osteoarthritis.  She has had 3 Orencia infusions.  She was switched from [place] to [place] due to recurrent infections.  She denies any side effects. She reports she has been having increased pain in multiple joints including bilateral hands, bilateral knee joints, bilateral ankles, bilateral feet.  She states that she has been off of Aleve due to elevated LFTs.  She has been taking Tylenol very sparingly.  She states that she has noticed swelling in her right first MCP joint and occasionally in bilateral feet.  She reports she is also been having increased lower back pain.  She states she has been having difficulty at night due to having pain at night.  She states that she feels some of her pain is due to the recent weather changes.  She states that her next Orencia infusion is on 10/26/2018.   Activities of Daily Living:  Patient reports morning stiffness 4-5 hours.   Patient Reports nocturnal pain.  Difficulty dressing/grooming: Denies Difficulty climbing stairs: Reports Difficulty getting out of chair: Reports Difficulty using hands for taps, buttons, cutlery, and/or writing: Reports  Review of Systems  Constitutional: Positive for fatigue.  HENT: Positive for mouth sores. Negative for mouth dryness and nose dryness.   Eyes: Positive for dryness (using systane drops daily). Negative for pain and visual disturbance.  Respiratory: Negative for cough, hemoptysis, shortness of breath and difficulty breathing.   Cardiovascular: Negative for chest pain, palpitations, hypertension and swelling in legs/feet.    Gastrointestinal: Negative for blood in stool, constipation and diarrhea.  Endocrine: Negative for increased urination.  Genitourinary: Negative for painful urination.  Musculoskeletal: Positive for arthralgias, joint pain, joint swelling and morning stiffness. Negative for myalgias, muscle weakness, muscle tenderness and myalgias.  Skin: Negative for color change, pallor, rash, hair loss, nodules/bumps, skin tightness, ulcers and sensitivity to sunlight.  Allergic/Immunologic: Negative for susceptible to infections.  Neurological: Negative for dizziness, numbness, headaches and weakness.  Hematological: Negative for swollen glands.  Psychiatric/Behavioral: Positive for depressed mood. Negative for sleep disturbance. The patient is nervous/anxious.     PMFS History:  Patient Active Problem List   Diagnosis Date Noted  . Primary osteoarthritis of both knees 04/30/2017  . History of anemia 04/30/2017  . History of migraine 04/30/2017  . History of vertigo 04/30/2017  . History of gastroesophageal reflux (GERD) 04/30/2017  . History of kidney stones 04/30/2017  . History of IBS 04/30/2017  . History of hyperlipidemia 04/30/2017  . Hypercalcemia 04/30/2017  . Rheumatoid arthritis involving multiple sites with positive rheumatoid factor ([place]) 12/08/2016  . High risk medication use 12/08/2016  . Primary osteoarthritis of both hands 12/08/2016  . Primary osteoarthritis of both feet 12/08/2016  . Osteoarthritis of lumbar spine 12/08/2016  . Former smoker 12/08/2016  . Abdominal pain, chronic, right lower quadrant 12/11/2013    Past Medical History:  Diagnosis Date  . GERD (gastroesophageal reflux disease)   . High blood pressure   . Rheumatoid arthritis(714.0)  History reviewed. No pertinent family history. Past Surgical History:  Procedure Laterality Date  . BACK SURGERY    . CERVICAL CONIZATION W/BX    . COLONOSCOPY N/A 12/27/2013   Procedure: COLONOSCOPY;  Surgeon: Rogene Houston, MD;  Location: [facility];  Service: Endoscopy;  Laterality: N/A;  320-moved to 125 Ann to notify pt  . FINGER FRACTURE SURGERY    . foot surgery for a bunion    . NASAL SINUS SURGERY    . TONSILLECTOMY     Social History   Social History Narrative  . Not on file    Objective: Vital Signs: BP 127/67 (BP Location: Left Arm, Patient Position: Sitting, Cuff Size: Normal)   Pulse 71   Resp 13   Ht 5\' 5"  (1.651 m)   Wt 174 lb 9.6 oz (79.2 kg)   BMI 29.05 kg/m    Physical Exam  Constitutional: She is oriented to person, place, and time. She appears well-developed and well-nourished.  HENT:  Head: Normocephalic and atraumatic.  Eyes: Conjunctivae and EOM are normal.  Neck: Normal range of motion.  Cardiovascular: Normal rate, regular rhythm, normal heart sounds and intact distal pulses.  Pulmonary/Chest: Effort normal and breath sounds normal.  Abdominal: Soft. Bowel sounds are normal.  Lymphadenopathy:    She has no cervical adenopathy.  Neurological: She is alert and oriented to person, place, and time.  Skin: Skin is warm and dry. Capillary refill takes less than 2 seconds.  Psychiatric: She has a normal mood and affect. Her behavior is normal.  Nursing note and vitals reviewed.    Musculoskeletal Exam: C-spine, thoracic spine, and lumbar spine good ROM.  No midline spinal tenderness.  No SI joint tenderness.  Shoulder joints, elbow joints, wrist joints, MCPs, PIPs, and DIPs good ROM with no synovitis.  Complete fist formation bilaterally.  PIP and DIP synovial thickening.  Hip joints, knee joints, ankle joints, MTPs, PIPs ,and DIPs good ROM with no synovitis.  No warmth or effusion of knee joints.  Bilateral knee crepitus.  PIP and DIP synovial thickening consistent with osteoarthritis of both feet.    CDAI Exam: CDAI Score: 8.2  Patient Global Assessment: 7 (mm); Provider Global Assessment: 5 (mm) Swollen: 0 ; Tender: 7  Joint Exam      Right  Left  MCP 1    Tender   Tender  MCP 2   Tender   Tender  MCP 3   Tender   Tender  MCP 5      Tender     Investigation: No additional findings.  Imaging: No results found.  Recent Labs: Lab Results  Component Value Date   WBC 7.8 08/31/2018   HGB 13.1 08/31/2018   PLT 200 08/31/2018   NA 141 08/31/2018   K 4.4 08/31/2018   CL 103 08/31/2018   CO2 29 08/31/2018   GLUCOSE 72 08/31/2018   BUN 15 08/31/2018   CREATININE 0.67 08/31/2018   BILITOT 0.6 08/31/2018   ALKPHOS 76 08/31/2018   AST 44 (H) 08/31/2018   ALT 43 08/31/2018   PROT 6.4 (L) 08/31/2018   ALBUMIN 4.0 08/31/2018   CALCIUM 9.6 08/31/2018   GFRAA >60 08/31/2018   QFTBGOLD Negative 03/11/2017   QFTBGOLDPLUS Negative 03/16/2018    Speciality Comments: No specialty comments available.  Procedures:  No procedures performed Allergies: Tetanus toxoid   Assessment / Plan:     Visit Diagnoses: Rheumatoid arthritis involving multiple sites with positive rheumatoid factor (HCC) - +RF +  CCP Ab, -ANA: She has no synovitis on exam.  She has been having increased pain in multiple joints including bilateral hands, bilateral knee joints, bilateral ankle joints, and bilateral feet.  Her pain is most likely due to osteoarthritis.  She was previously on [place] infusions but discontinued due to recurrent infections.  She was switched to [facility] and has had 3 infusions so far.  She has not had any side effects or recent infections since being on Orencia.  Her next infusion is on 10/26/2018.  She will continue on Orencia infusions every 4 weeks.  She is unable to take NSAIDs or Tylenol at this time due to elevated AST on 08/31/2018.  She continues to get lab work with infusions.  She will follow-up in the office in 3 months to see how she is doing on Orencia at that time.  She was advised to notify us if she develops increased joint pain or joint swelling. She had her yearly influenza vaccine as well as the pneumonia vaccine.  We discussed the  importance of getting the Shingrix vaccination.  High risk medication use -  Started on Orencia, recurrent infections on Simponi Aria IV.  TB gold was negative on 03/16/2018.  CBC and CMP were drawn on 08/31/2018.  AST was 44 at that time.  She has been avoiding Aleve but has been taking Tylenol very sparingly.  We discussed the importance of avoiding all NSAIDs and Tylenol at this time.  She will continue to have lab work drawn with infusions.  Primary osteoarthritis of both hands: She has PIP and DIP synovial thickening consistent with osteoarthritis. She has complete fist formation.  Joint protection and muscle strengthening were discussed.   Primary osteoarthritis of both knees: No warmth or effusion.  Good range of motion.  She has bilateral knee crepitus.  She has been having increased discomfort in bilateral knee joints.  Primary osteoarthritis of both feet: She has PIP and DIP synovial thickening consistent with osteoarthritis of bilateral feet.  She is been having increased discomfort in both feet.  No synovitis was noted.  We discussed the importance of wearing proper fitting shoes.  Spondylosis of lumbar region without myelopathy or radiculopathy: No midline spinal tenderness.  She has been having increased lower back pain lately. She lifted a 50 lb bag of bird seed yesterday, which exacerbated her back pain.   Other medical conditions are listed as follows:   Former smoker  History of vertigo  History of anemia  History of migraine  History of gastroesophageal reflux (GERD)  History of kidney stones  History of hyperlipidemia  History of IBS   Orders: No orders of the defined types were placed in this encounter.  No orders of the defined types were placed in this encounter.    Follow-Up Instructions: Return in about 3 months (around 01/12/2019) for Rheumatoid arthritis, Osteoarthritis.   Ofilia Neas, PA-C  Note - This record has been created using Dragon software.    Chart creation errors have been sought, but may not always  have been located. Such creation errors do not reflect on  the standard of medical care.

## 2018-10-12 ENCOUNTER — Encounter: Payer: Self-pay | Admitting: Physician Assistant

## 2018-10-12 ENCOUNTER — Ambulatory Visit (INDEPENDENT_AMBULATORY_CARE_PROVIDER_SITE_OTHER): Payer: Medicare Other | Admitting: Physician Assistant

## 2018-10-12 VITALS — BP 127/67 | HR 71 | Resp 13 | Ht 65.0 in | Wt 174.6 lb

## 2018-10-12 DIAGNOSIS — Z79899 Other long term (current) drug therapy: Secondary | ICD-10-CM

## 2018-10-12 DIAGNOSIS — Z8669 Personal history of other diseases of the nervous system and sense organs: Secondary | ICD-10-CM

## 2018-10-12 DIAGNOSIS — M19042 Primary osteoarthritis, left hand: Secondary | ICD-10-CM

## 2018-10-12 DIAGNOSIS — M19041 Primary osteoarthritis, right hand: Secondary | ICD-10-CM | POA: Diagnosis not present

## 2018-10-12 DIAGNOSIS — Z8719 Personal history of other diseases of the digestive system: Secondary | ICD-10-CM

## 2018-10-12 DIAGNOSIS — M0579 Rheumatoid arthritis with rheumatoid factor of multiple sites without organ or systems involvement: Secondary | ICD-10-CM | POA: Diagnosis not present

## 2018-10-12 DIAGNOSIS — Z87442 Personal history of urinary calculi: Secondary | ICD-10-CM

## 2018-10-12 DIAGNOSIS — Z87891 Personal history of nicotine dependence: Secondary | ICD-10-CM

## 2018-10-12 DIAGNOSIS — M19072 Primary osteoarthritis, left ankle and foot: Secondary | ICD-10-CM

## 2018-10-12 DIAGNOSIS — M47816 Spondylosis without myelopathy or radiculopathy, lumbar region: Secondary | ICD-10-CM | POA: Diagnosis not present

## 2018-10-12 DIAGNOSIS — M170 Bilateral primary osteoarthritis of knee: Secondary | ICD-10-CM

## 2018-10-12 DIAGNOSIS — M19071 Primary osteoarthritis, right ankle and foot: Secondary | ICD-10-CM

## 2018-10-12 DIAGNOSIS — Z862 Personal history of diseases of the blood and blood-forming organs and certain disorders involving the immune mechanism: Secondary | ICD-10-CM | POA: Diagnosis not present

## 2018-10-12 DIAGNOSIS — Z87898 Personal history of other specified conditions: Secondary | ICD-10-CM

## 2018-10-12 DIAGNOSIS — Z8639 Personal history of other endocrine, nutritional and metabolic disease: Secondary | ICD-10-CM

## 2018-10-21 ENCOUNTER — Other Ambulatory Visit: Payer: Self-pay | Admitting: *Deleted

## 2018-10-21 NOTE — Progress Notes (Signed)
Infusion orders are current for patient CBC CMP Tylenol Benadryl appointments are up to date and follow up appointment  is scheduled TB gold not due yet.  

## 2018-10-26 ENCOUNTER — Encounter (HOSPITAL_COMMUNITY)
Admission: RE | Admit: 2018-10-26 | Discharge: 2018-10-26 | Disposition: A | Discharge status: Home / Self Care | Payer: Medicare Other | Source: Ambulatory Visit | Attending: Rheumatology | Admitting: Rheumatology

## 2018-10-26 ENCOUNTER — Encounter (HOSPITAL_COMMUNITY): Payer: Self-pay

## 2018-10-26 DIAGNOSIS — M0579 Rheumatoid arthritis with rheumatoid factor of multiple sites without organ or systems involvement: Secondary | ICD-10-CM | POA: Insufficient documentation

## 2018-10-26 LAB — CBC
HEMATOCRIT: 41.5 % (ref 36.0–46.0)
Hemoglobin: 13.1 g/dL (ref 12.0–15.0)
MCH: 28.4 pg (ref 26.0–34.0)
MCHC: 31.6 g/dL (ref 30.0–36.0)
MCV: 89.8 fL (ref 80.0–100.0)
NRBC: 0 % (ref 0.0–0.2)
PLATELETS: 186 10*3/uL (ref 150–400)
RBC: 4.62 MIL/uL (ref 3.87–5.11)
RDW: 14 % (ref 11.5–15.5)
WBC: 5.2 10*3/uL (ref 4.0–10.5)

## 2018-10-26 LAB — COMPREHENSIVE METABOLIC PANEL
ALBUMIN: 4 g/dL (ref 3.5–5.0)
ALT: 34 U/L (ref 0–44)
AST: 32 U/L (ref 15–41)
Alkaline Phosphatase: 78 U/L (ref 38–126)
Anion gap: 8 (ref 5–15)
BILIRUBIN TOTAL: 0.7 mg/dL (ref 0.3–1.2)
BUN: 21 mg/dL (ref 8–23)
CHLORIDE: 104 mmol/L (ref 98–111)
CO2: 27 mmol/L (ref 22–32)
Calcium: 9.5 mg/dL (ref 8.9–10.3)
Creatinine, Ser: 0.81 mg/dL (ref 0.44–1.00)
GFR calc Af Amer: >60 mL/min (ref >60)
GFR calc non Af Amer: >60 mL/min (ref >60)
GLUCOSE: 86 mg/dL (ref 70–99)
POTASSIUM: 4.4 mmol/L (ref 3.5–5.1)
SODIUM: 139 mmol/L (ref 135–145)
Total Protein: 6.9 g/dL (ref 6.5–8.1)

## 2018-10-26 MED ORDER — SODIUM CHLORIDE 0.9 % IV SOLN
750.0000 mg | INTRAVENOUS | Status: DC
  Administered 2018-10-26: 750 mg via INTRAVENOUS
  Filled 2018-10-26: qty 30

## 2018-10-26 MED ORDER — SODIUM CHLORIDE 0.9 % IV SOLN
Freq: Once | INTRAVENOUS | Status: AC
  Administered 2018-10-26: 08:00:00 via INTRAVENOUS

## 2018-10-26 MED ORDER — ACETAMINOPHEN 325 MG PO TABS: 650.0000 mg | ORAL_TABLET | Freq: Once | ORAL | Status: DC

## 2018-10-26 MED ORDER — DIPHENHYDRAMINE HCL 25 MG PO CAPS: 25.0000 mg | ORAL_CAPSULE | Freq: Once | ORAL | Status: DC

## 2018-11-10 DIAGNOSIS — J069 Acute upper respiratory infection, unspecified: Secondary | ICD-10-CM | POA: Diagnosis not present

## 2018-11-10 DIAGNOSIS — Z6828 Body mass index (BMI) 28.0-28.9, adult: Secondary | ICD-10-CM | POA: Diagnosis not present

## 2018-11-10 DIAGNOSIS — E7800 Pure hypercholesterolemia, unspecified: Secondary | ICD-10-CM | POA: Diagnosis not present

## 2018-11-10 DIAGNOSIS — I10 Essential (primary) hypertension: Secondary | ICD-10-CM | POA: Diagnosis not present

## 2018-11-10 DIAGNOSIS — Z299 Encounter for prophylactic measures, unspecified: Secondary | ICD-10-CM | POA: Diagnosis not present

## 2018-11-10 DIAGNOSIS — Z87891 Personal history of nicotine dependence: Secondary | ICD-10-CM | POA: Diagnosis not present

## 2018-11-16 ENCOUNTER — Telehealth: Payer: Self-pay | Admitting: Pharmacy Technician

## 2018-11-16 NOTE — Telephone Encounter (Signed)
Called the patient to verify benefits for 2020. Pt currently received infusions that may require a pre-certification. Patient states that her insurance plans will not change for 2020. Medicare covers 80% of the infusion and no authorization is required, and the AARP supplement would cover the 20% of the cost that was not paid for by Medicare as long as Medicare covered the medication and her deductible.

## 2018-12-20 ENCOUNTER — Other Ambulatory Visit: Payer: Self-pay | Admitting: *Deleted

## 2018-12-20 DIAGNOSIS — Z299 Encounter for prophylactic measures, unspecified: Secondary | ICD-10-CM | POA: Diagnosis not present

## 2018-12-20 DIAGNOSIS — I10 Essential (primary) hypertension: Secondary | ICD-10-CM | POA: Diagnosis not present

## 2018-12-20 DIAGNOSIS — J449 Chronic obstructive pulmonary disease, unspecified: Secondary | ICD-10-CM | POA: Diagnosis not present

## 2018-12-20 DIAGNOSIS — R490 Dysphonia: Secondary | ICD-10-CM | POA: Diagnosis not present

## 2018-12-20 DIAGNOSIS — M0579 Rheumatoid arthritis with rheumatoid factor of multiple sites without organ or systems involvement: Secondary | ICD-10-CM

## 2018-12-20 DIAGNOSIS — F329 Major depressive disorder, single episode, unspecified: Secondary | ICD-10-CM | POA: Diagnosis not present

## 2018-12-20 DIAGNOSIS — Z87891 Personal history of nicotine dependence: Secondary | ICD-10-CM | POA: Diagnosis not present

## 2018-12-20 DIAGNOSIS — Z6829 Body mass index (BMI) 29.0-29.9, adult: Secondary | ICD-10-CM | POA: Diagnosis not present

## 2018-12-21 ENCOUNTER — Encounter (HOSPITAL_COMMUNITY)
Admission: RE | Admit: 2018-12-21 | Discharge: 2018-12-21 | Disposition: A | Discharge status: Home / Self Care | Payer: Medicare Other | Source: Ambulatory Visit | Attending: Rheumatology | Admitting: Rheumatology

## 2018-12-21 DIAGNOSIS — M0579 Rheumatoid arthritis with rheumatoid factor of multiple sites without organ or systems involvement: Secondary | ICD-10-CM | POA: Diagnosis not present

## 2018-12-21 LAB — COMPREHENSIVE METABOLIC PANEL
ALT: 34 U/L (ref 0–44)
AST: 36 U/L (ref 15–41)
Albumin: 4 g/dL (ref 3.5–5.0)
Alkaline Phosphatase: 66 U/L (ref 38–126)
Anion gap: 7 (ref 5–15)
BILIRUBIN TOTAL: 0.5 mg/dL (ref 0.3–1.2)
BUN: 19 mg/dL (ref 8–23)
CO2: 26 mmol/L (ref 22–32)
Calcium: 9.4 mg/dL (ref 8.9–10.3)
Chloride: 104 mmol/L (ref 98–111)
Creatinine, Ser: 0.75 mg/dL (ref 0.44–1.00)
GFR calc Af Amer: >60 mL/min (ref >60)
GFR calc non Af Amer: >60 mL/min (ref >60)
GLUCOSE: 82 mg/dL (ref 70–99)
Potassium: 4.3 mmol/L (ref 3.5–5.1)
Sodium: 137 mmol/L (ref 135–145)
TOTAL PROTEIN: 6.7 g/dL (ref 6.5–8.1)

## 2018-12-21 LAB — CBC
HEMATOCRIT: 42.4 % (ref 36.0–46.0)
Hemoglobin: 13 g/dL (ref 12.0–15.0)
MCH: 27.3 pg (ref 26.0–34.0)
MCHC: 30.7 g/dL (ref 30.0–36.0)
MCV: 88.9 fL (ref 80.0–100.0)
Platelets: 170 10*3/uL (ref 150–400)
RBC: 4.77 MIL/uL (ref 3.87–5.11)
RDW: 14 % (ref 11.5–15.5)
WBC: 5.8 10*3/uL (ref 4.0–10.5)
nRBC: 0 % (ref 0.0–0.2)

## 2018-12-21 MED ORDER — SODIUM CHLORIDE 0.9 % IV SOLN
750.0000 mg | Freq: Once | INTRAVENOUS | Status: AC
  Administered 2018-12-21: 750 mg via INTRAVENOUS
  Filled 2018-12-21: qty 30

## 2018-12-21 MED ORDER — SODIUM CHLORIDE 0.9 % IV SOLN
Freq: Once | INTRAVENOUS | Status: AC
  Administered 2018-12-21: 250 mL via INTRAVENOUS

## 2018-12-21 NOTE — Progress Notes (Signed)
Hgb result of 9.1 is from another pt, not Tiffany Pollard.

## 2018-12-21 NOTE — Progress Notes (Signed)
WNLs

## 2018-12-21 NOTE — Progress Notes (Signed)
Pt states she took her tylenol and benadryl at home before coming to hospital. 

## 2018-12-21 NOTE — Progress Notes (Signed)
Noted  

## 2018-12-23 LAB — POCT HEMOGLOBIN-HEMACUE: Hemoglobin: 9.1 g/dL — ABNORMAL LOW (ref 12.0–15.0)

## 2018-12-30 NOTE — Progress Notes (Signed)
Office Visit Note  Patient: Tiffany Pollard             Date of Birth: 27-Nov-1951           MRN: [id number]             PCP: Monico Blitz, MD Referring: Monico Blitz, MD Visit Date: 01/12/2019 Occupation: @GUAROCC @  Subjective:  Pain in both knee joints   History of Present Illness: Tiffany Pollard is a 68 y.o. female with history of seropositive rheumatoid arthritis and osteoarthritis.  She is on Orencia infusions every 4 weeks.  Patient reports that in November when she went for her Orencia infusion she was told that she should be receiving infusions every 8 weeks rather than every 4.  She developed increased joint pain when spacing the dosing.  At her next infusion a different nurse told her that she should be receiving infusions every 4 weeks.  Her next infusion is on Wednesday.  She reports that she has been doing significantly better since switching from [employer] to [facility] [facility].  She denies any recent infections or side effects since starting on Orencia.  She has been having increased discomfort in bilateral knee joints but denies any joint swelling.  She is considering receiving Visco gel injections.  She reports that she has mild swelling in the left ankle.  She states that she has very flat feet and pronates.  She reports that she is tried several different shoes without much relief.  She experiences discomfort in her feet due to bunions bilaterally.  She reports that she has some decreased grip strength but denies any pain or swelling in her hands at this time.  She states that she has intermittent neck pain but no symptoms of radiculopathy.  She denies any lower back pain at this time.   Activities of Daily Living:  Patient reports morning stiffness for 1 hour.   Patient Reports nocturnal pain.  Difficulty dressing/grooming: Denies Difficulty climbing stairs: Denies Difficulty getting out of chair: Reports Difficulty using hands for taps, buttons, cutlery, and/or writing:  Denies  Review of Systems  Constitutional: Positive for fatigue.  HENT: Positive for mouth dryness. Negative for mouth sores and nose dryness.   Eyes: Positive for dryness. Negative for pain and visual disturbance.  Respiratory: Negative for cough, hemoptysis, shortness of breath and difficulty breathing.   Cardiovascular: Negative for chest pain, palpitations, hypertension and swelling in legs/feet.  Gastrointestinal: Negative for blood in stool, constipation and diarrhea.  Endocrine: Negative for increased urination.  Genitourinary: Negative for painful urination.  Musculoskeletal: Positive for arthralgias, joint pain, joint swelling and morning stiffness. Negative for myalgias, muscle weakness, muscle tenderness and myalgias.  Skin: Negative for color change, pallor, rash, hair loss, nodules/bumps, skin tightness, ulcers and sensitivity to sunlight.  Allergic/Immunologic: Negative for susceptible to infections.  Neurological: Negative for dizziness, numbness, headaches and weakness.  Hematological: Negative for swollen glands.  Psychiatric/Behavioral: Negative for depressed mood and sleep disturbance. The patient is nervous/anxious.     PMFS History:  Patient Active Problem List   Diagnosis Date Noted  . Primary osteoarthritis of both knees 04/30/2017  . History of anemia 04/30/2017  . History of migraine 04/30/2017  . History of vertigo 04/30/2017  . History of gastroesophageal reflux (GERD) 04/30/2017  . History of kidney stones 04/30/2017  . History of IBS 04/30/2017  . History of hyperlipidemia 04/30/2017  . Hypercalcemia 04/30/2017  . Rheumatoid arthritis involving multiple sites with positive rheumatoid factor ([place]) 12/08/2016  .  High risk medication use 12/08/2016  . Primary osteoarthritis of both hands 12/08/2016  . Primary osteoarthritis of both feet 12/08/2016  . Osteoarthritis of lumbar spine 12/08/2016  . Former smoker 12/08/2016  . Abdominal pain, chronic, right  lower quadrant 12/11/2013    Past Medical History:  Diagnosis Date  . GERD (gastroesophageal reflux disease)   . High blood pressure   . Rheumatoid arthritis(714.0)     History reviewed. No pertinent family history. Past Surgical History:  Procedure Laterality Date  . BACK SURGERY    . CERVICAL CONIZATION W/BX    . COLONOSCOPY N/A 12/27/2013   Procedure: COLONOSCOPY;  Surgeon: Rogene Houston, MD;  [facility]: [facility];  Service: Endoscopy;  Laterality: N/A;  320-moved to 125 Ann to notify pt  . FINGER FRACTURE SURGERY    . foot surgery for a bunion    . NASAL SINUS SURGERY    . TONSILLECTOMY     Social History   Social History Narrative  . Not on file   Immunization History  Administered Date(s) Administered  . Influenza,inj,Quad PF,6+ Mos 08/18/2017  . Pneumococcal Conjugate-13 07/13/2018     Objective: Vital Signs: BP 126/74 (BP Location: Left Arm, Patient Position: Sitting, Cuff Size: Normal)   Pulse 78   Resp 14   Ht 5\' 5"  (1.651 m)   Wt 174 lb 9.6 oz (79.2 kg)   BMI 29.05 kg/m    Physical Exam Vitals signs and nursing note reviewed.  Constitutional:      Appearance: She is well-developed.  HENT:     Head: Normocephalic and atraumatic.  Eyes:     Conjunctiva/sclera: Conjunctivae normal.  Neck:     Musculoskeletal: Normal range of motion.  Cardiovascular:     Rate and Rhythm: Normal rate and regular rhythm.     Heart sounds: Normal heart sounds.  Pulmonary:     Effort: Pulmonary effort is normal.     Breath sounds: Normal breath sounds.  Abdominal:     General: Bowel sounds are normal.     Palpations: Abdomen is soft.  Lymphadenopathy:     Cervical: No cervical adenopathy.  Skin:    General: Skin is warm and dry.     Capillary Refill: Capillary refill takes less than 2 seconds.  Neurological:     Mental Status: She is alert and oriented to person, place, and time.  Psychiatric:        Behavior: Behavior normal.      Musculoskeletal Exam:  C-spine, thoracic spine, and lumbar spine good ROM.  No midline spinal tenderness.  No SI joint tenderness.  Shoulder joints, elbow joints, wrist joints, MCPs, PIPs, DIPs good range of motion no synovitis.  She has tenderness of the right second and third MCP joints.  Hip joints, knee joints, ankle joints, MTPs, PIPs and DIPs good range of motion no synovitis.  No warmth or effusion bilateral knee joints.  She has tenderness of both ankle joints.  She has bunions bilaterally.  Pes planus bilaterally.    CDAI Exam: CDAI Score: 2.6  Patient Global Assessment: 3 (mm); Provider Global Assessment: 3 (mm) Swollen: 0 ; Tender: 4  Joint Exam      Right  Left  MCP 2   Tender     MCP 3   Tender     Ankle   Tender   Tender   There is currently no information documented on the homunculus. Go to the Rheumatology activity and complete the homunculus joint exam.  Investigation: No additional findings.  Imaging: No results found.  Recent Labs: Lab Results  Component Value Date   WBC 5.8 12/21/2018   HGB 9.1 (L) 12/21/2018   PLT 170 12/21/2018   NA 137 12/21/2018   K 4.3 12/21/2018   CL 104 12/21/2018   CO2 26 12/21/2018   GLUCOSE 82 12/21/2018   BUN 19 12/21/2018   CREATININE 0.75 12/21/2018   BILITOT 0.5 12/21/2018   ALKPHOS 66 12/21/2018   AST 36 12/21/2018   ALT 34 12/21/2018   PROT 6.7 12/21/2018   ALBUMIN 4.0 12/21/2018   CALCIUM 9.4 12/21/2018   GFRAA >60 12/21/2018   QFTBGOLD Negative 03/11/2017   QFTBGOLDPLUS Negative 03/16/2018    Speciality Comments: No specialty comments available.  Procedures:  No procedures performed Allergies: Tetanus toxoid   Assessment / Plan:     Visit Diagnoses: Rheumatoid arthritis involving multiple sites with positive rheumatoid factor (HCC) - +RF +CCP Ab, -ANA: She has no synovitis on exam today.  She has tenderness of the right second and third MCPs and bilateral ankle joints.  She has no significant improvement since switching from Simponi  Aria infusions to Orencia infusions.  She has been tolerating infusions without any side effects.  She has not had any recent infections.  She underwent the loading doses of Orencia but was told by a nurse that her infusions would be every 8 weeks.  She developed increased joint pain when spacing and when she returned for her next infusion she was advised that it was actually supposed to be every 4 weeks.  She is due for her next infusion on Wednesday.  We discussed that Orencia infusions are scheduled for every 4 weeks.  She was advised to notify us if she has increased joint pain or joint swelling.  She will follow-up in the office in 5 months.  We will obtain x-rays at her next visit.  High risk medication use - Orencia infusions every 4 weeks, d/c Simponi Aria IV due to recurrent infections.  Standing orders for CBC and CMP were placed today.  Future order for TB gold was also placed.- Plan: QuantiFERON-TB Gold Plus, CBC with Differential/Platelet, COMPLETE METABOLIC PANEL WITH GFR  Primary osteoarthritis of both hands: She has PIP and DIP synovial thickening consistent with osteoarthritis of bilateral hands.  She has complete fist formation bilaterally.  Joint protection and muscle strengthening were discussed.  Primary osteoarthritis of both knees: No warmth or effusion of bilateral knee joints.  She is been having increased discomfort in both knees.  She has not any recent injuries or falls.  She has no mechanical symptoms at this time.  She is been having pain in bilateral knee joints especially at night.  She declined x-rays at this time.  She does not want proceed with cortisone or Visco gel injections at this time.  She was advised to notify us if she develops increased joint pain or joint swelling.  Primary osteoarthritis of both feet: She has osteoarthritic changes in bilateral feet.  She has bunions bilaterally.  Pes planus noted.  She has tenderness of bilateral ankle joints.  We discussed the  importance of wearing proper fitting shoes.  She has tried wearing different types of shoes to help.  We discussed trying to go to the street market to be fitted for proper shoes.  Spondylosis of lumbar region without myelopathy or radiculopathy: She has no midline spinal tenderness.  She is good range of motion with no discomfort.  She has  no symptoms of radiculopathy at this time.  Other medical conditions are listed as follows:  Former smoker  History of vertigo  History of anemia  History of migraine  History of gastroesophageal reflux (GERD)  History of kidney stones  History of hyperlipidemia  History of IBS   Orders: Orders Placed This Encounter  Procedures  . QuantiFERON-TB Gold Plus  . CBC with Differential/Platelet  . COMPLETE METABOLIC PANEL WITH GFR   No orders of the defined types were placed in this encounter.     Follow-Up Instructions: Return in about 5 months (around 06/12/2019) for Rheumatoid arthritis, Osteoarthritis.   Ofilia Neas, PA-C  Note - This record has been created using Dragon software.  Chart creation errors have been sought, but may not always  have been located. Such creation errors do not reflect on  the standard of medical care.

## 2019-01-12 ENCOUNTER — Ambulatory Visit (INDEPENDENT_AMBULATORY_CARE_PROVIDER_SITE_OTHER): Payer: Self-pay

## 2019-01-12 ENCOUNTER — Ambulatory Visit (INDEPENDENT_AMBULATORY_CARE_PROVIDER_SITE_OTHER): Payer: Medicare Other | Admitting: Physician Assistant

## 2019-01-12 ENCOUNTER — Encounter: Payer: Self-pay | Admitting: Physician Assistant

## 2019-01-12 VITALS — BP 126/74 | HR 78 | Resp 14 | Ht 65.0 in | Wt 174.6 lb

## 2019-01-12 DIAGNOSIS — Z862 Personal history of diseases of the blood and blood-forming organs and certain disorders involving the immune mechanism: Secondary | ICD-10-CM | POA: Diagnosis not present

## 2019-01-12 DIAGNOSIS — Z79899 Other long term (current) drug therapy: Secondary | ICD-10-CM

## 2019-01-12 DIAGNOSIS — Z87898 Personal history of other specified conditions: Secondary | ICD-10-CM | POA: Diagnosis not present

## 2019-01-12 DIAGNOSIS — Z8719 Personal history of other diseases of the digestive system: Secondary | ICD-10-CM | POA: Diagnosis not present

## 2019-01-12 DIAGNOSIS — M170 Bilateral primary osteoarthritis of knee: Secondary | ICD-10-CM

## 2019-01-12 DIAGNOSIS — M47816 Spondylosis without myelopathy or radiculopathy, lumbar region: Secondary | ICD-10-CM

## 2019-01-12 DIAGNOSIS — Z8669 Personal history of other diseases of the nervous system and sense organs: Secondary | ICD-10-CM | POA: Diagnosis not present

## 2019-01-12 DIAGNOSIS — M19041 Primary osteoarthritis, right hand: Secondary | ICD-10-CM

## 2019-01-12 DIAGNOSIS — Z8639 Personal history of other endocrine, nutritional and metabolic disease: Secondary | ICD-10-CM

## 2019-01-12 DIAGNOSIS — M19042 Primary osteoarthritis, left hand: Secondary | ICD-10-CM

## 2019-01-12 DIAGNOSIS — Z87442 Personal history of urinary calculi: Secondary | ICD-10-CM | POA: Diagnosis not present

## 2019-01-12 DIAGNOSIS — Z87891 Personal history of nicotine dependence: Secondary | ICD-10-CM

## 2019-01-12 DIAGNOSIS — M19072 Primary osteoarthritis, left ankle and foot: Secondary | ICD-10-CM

## 2019-01-12 DIAGNOSIS — M0579 Rheumatoid arthritis with rheumatoid factor of multiple sites without organ or systems involvement: Secondary | ICD-10-CM | POA: Diagnosis not present

## 2019-01-12 DIAGNOSIS — M19071 Primary osteoarthritis, right ankle and foot: Secondary | ICD-10-CM

## 2019-01-13 ENCOUNTER — Other Ambulatory Visit: Payer: Self-pay | Admitting: *Deleted

## 2019-01-18 ENCOUNTER — Encounter (HOSPITAL_COMMUNITY)
Admission: RE | Admit: 2019-01-18 | Discharge: 2019-01-18 | Disposition: A | Discharge status: Home / Self Care | Payer: Medicare Other | Source: Ambulatory Visit | Attending: Rheumatology | Admitting: Rheumatology

## 2019-01-18 ENCOUNTER — Encounter (HOSPITAL_COMMUNITY): Payer: Self-pay

## 2019-01-18 ENCOUNTER — Encounter (HOSPITAL_COMMUNITY): Payer: Medicare Other

## 2019-01-18 DIAGNOSIS — M0579 Rheumatoid arthritis with rheumatoid factor of multiple sites without organ or systems involvement: Secondary | ICD-10-CM | POA: Diagnosis not present

## 2019-01-18 MED ORDER — SODIUM CHLORIDE 0.9 % IV SOLN
Freq: Once | INTRAVENOUS | Status: AC
  Administered 2019-01-18: 09:00:00 via INTRAVENOUS

## 2019-01-18 MED ORDER — SODIUM CHLORIDE 0.9 % IV SOLN
750.0000 mg | INTRAVENOUS | Status: DC
  Administered 2019-01-18: 750 mg via INTRAVENOUS
  Filled 2019-01-18: qty 30

## 2019-01-30 ENCOUNTER — Other Ambulatory Visit: Payer: Self-pay | Admitting: *Deleted

## 2019-01-30 DIAGNOSIS — M0579 Rheumatoid arthritis with rheumatoid factor of multiple sites without organ or systems involvement: Secondary | ICD-10-CM

## 2019-02-02 ENCOUNTER — Ambulatory Visit (INDEPENDENT_AMBULATORY_CARE_PROVIDER_SITE_OTHER): Payer: Medicare Other

## 2019-02-02 ENCOUNTER — Encounter (INDEPENDENT_AMBULATORY_CARE_PROVIDER_SITE_OTHER): Payer: Self-pay | Admitting: Orthopaedic Surgery

## 2019-02-02 ENCOUNTER — Ambulatory Visit (INDEPENDENT_AMBULATORY_CARE_PROVIDER_SITE_OTHER): Payer: Medicare Other | Admitting: Orthopaedic Surgery

## 2019-02-02 VITALS — BP 117/65 | HR 79 | Ht 65.0 in | Wt 175.0 lb

## 2019-02-02 DIAGNOSIS — M25511 Pain in right shoulder: Secondary | ICD-10-CM | POA: Diagnosis not present

## 2019-02-02 DIAGNOSIS — M7521 Bicipital tendinitis, right shoulder: Secondary | ICD-10-CM | POA: Diagnosis not present

## 2019-02-02 NOTE — Progress Notes (Signed)
Office Visit Note   Patient: Tiffany Pollard           Date of Birth: 12/16/1950           MRN: [id number] Visit Date: 02/02/2019              Requested by: Monico Blitz, MD [address] [place], [place] [address] PCP: Monico Blitz, MD   Assessment & Plan: Visit Diagnoses:  1. Acute pain of right shoulder   2. Biceps tendinitis of right upper extremity     Plan: Right shoulder injected anteriorly around the biceps tendon intra-articularly.  She got good relief of her pain improved range of motion.  She can return if she has persistent problemsWe reviewed the patient's myelogram CT scan.. Or the injection wears off she can call and we can then consider proceeding with MRI scan of her shoulder.  Follow-Up Instructions: Return if symptoms worsen or fail to improve.   Orders:  Orders Placed This Encounter  Procedures  . Large Joint Inj: R glenohumeral  . XR Shoulder Right   No orders of the defined types were placed in this encounter.     Procedures: Large Joint Inj: R glenohumeral on 02/02/2019 3:28 PM Indications: pain Details: 22 G 1.5 in needle  Arthrogram: No  Medications: 4 mL bupivacaine 0.25 %; 40 mg methylPREDNISolone acetate 40 MG/ML; 0.5 mL lidocaine 1 %; 2 mL bupivacaine 0.25 % Outcome: tolerated well, no immediate complications Procedure, treatment alternatives, risks and benefits explained, specific risks discussed. Consent was given by the patient. Immediately prior to procedure a time out was called to verify the correct patient, procedure, equipment, support staff and site/side marked as required. Patient was prepped and draped in the usual sterile fashion.       Clinical Data: No additional findings.   Subjective: Chief Complaint  Patient presents with  . Right Shoulder - Pain    HPI 68 year old female with diagnosis of RA 2006 currently on Orencia also with diagnosis of osteoarthritis reach Sunday after church on 01/08/2019 behind the car seat suddenly  felt a sharp pop in her right shoulder with extreme pain.  Is now been about 25 days she is left-hand dominant.  It does not seem to really have bothered her sleep although she has some problems sleeping with her RA.  No past history of shoulder injury in the past to the right shoulder.  She denies associated neck pain with symptoms.  Review of Systems 14 point review system positive for RA osteoarthritis on Orencia.  RA diagnosed 2006.  Positive for former smoker history of anemia history of migraines, defer MI CVA.  Positive for kidney stones positive for IBS.  Positive for hyperlipidemia.   Objective: Vital Signs: BP 117/65   Pulse 79   Ht 5\' 5"  (1.651 m)   Wt 175 lb (79.4 kg)   BMI 29.12 kg/m   Physical Exam Constitutional:      Appearance: She is well-developed.  HENT:     Head: Normocephalic.     Right Ear: External ear normal.     Left Ear: External ear normal.  Eyes:     Pupils: Pupils are equal, round, and reactive to light.  Neck:     Thyroid: No thyromegaly.     Trachea: No tracheal deviation.  Cardiovascular:     Rate and Rhythm: Normal rate.  Pulmonary:     Effort: Pulmonary effort is normal.  Abdominal:     Palpations: Abdomen is soft.  Skin:    General: Skin is warm and dry.  Neurological:     Mental Status: She is alert and oriented to person, place, and time.  Psychiatric:        Behavior: Behavior normal.     Ortho Exam  Specialty Comments:  No specialty comments available.  Imaging: No results found.   PMFS History: Patient Active Problem List   Diagnosis Date Noted  . Primary osteoarthritis of both knees 04/30/2017  . History of anemia 04/30/2017  . History of migraine 04/30/2017  . History of vertigo 04/30/2017  . History of gastroesophageal reflux (GERD) 04/30/2017  . History of kidney stones 04/30/2017  . History of IBS 04/30/2017  . History of hyperlipidemia 04/30/2017  . Hypercalcemia 04/30/2017  . Rheumatoid arthritis involving  multiple sites with positive rheumatoid factor ([place]) 12/08/2016  . High risk medication use 12/08/2016  . Primary osteoarthritis of both hands 12/08/2016  . Primary osteoarthritis of both feet 12/08/2016  . Osteoarthritis of lumbar spine 12/08/2016  . Former smoker 12/08/2016  . Abdominal pain, chronic, right lower quadrant 12/11/2013   Past Medical History:  Diagnosis Date  . GERD (gastroesophageal reflux disease)   . High blood pressure   . Rheumatoid arthritis(714.0)     No family history on file.  Past Surgical History:  Procedure Laterality Date  . BACK SURGERY    . CERVICAL CONIZATION W/BX    . COLONOSCOPY N/A 12/27/2013   Procedure: COLONOSCOPY;  Surgeon: Rogene Houston, MD;  Location: [facility];  Service: Endoscopy;  Laterality: N/A;  320-moved to 125 Ann to notify pt  . FINGER FRACTURE SURGERY    . foot surgery for a bunion    . NASAL SINUS SURGERY    . TONSILLECTOMY     Social History   Occupational History  . Not on file  Tobacco Use  . Smoking status: Former Smoker    Packs/day: 1.25    Years: 30.00    Pack years: 37.50    Types: Cigarettes    Last attempt to quit: 01/05/2004    Years since quitting: 15.0  . Smokeless tobacco: Never Used  Substance and Sexual Activity  . Alcohol use: No  . Drug use: No  . Sexual activity: Not on file

## 2019-02-05 ENCOUNTER — Encounter (INDEPENDENT_AMBULATORY_CARE_PROVIDER_SITE_OTHER): Payer: Self-pay | Admitting: Orthopaedic Surgery

## 2019-02-05 MED ORDER — BUPIVACAINE HCL 0.25 % IJ SOLN
2.0000 mL | INTRAMUSCULAR | Status: AC | PRN
  Administered 2019-02-02: 2 mL via INTRA_ARTICULAR

## 2019-02-05 MED ORDER — METHYLPREDNISOLONE ACETATE 40 MG/ML IJ SUSP
40.0000 mg | INTRAMUSCULAR | Status: AC | PRN
  Administered 2019-02-02: 40 mg via INTRA_ARTICULAR

## 2019-02-05 MED ORDER — BUPIVACAINE HCL 0.25 % IJ SOLN
4.0000 mL | INTRAMUSCULAR | Status: AC | PRN
  Administered 2019-02-02: 4 mL via INTRA_ARTICULAR

## 2019-02-05 MED ORDER — LIDOCAINE HCL 1 % IJ SOLN
0.5000 mL | INTRAMUSCULAR | Status: AC | PRN
  Administered 2019-02-02: .5 mL

## 2019-02-15 ENCOUNTER — Other Ambulatory Visit: Payer: Self-pay

## 2019-02-15 ENCOUNTER — Encounter (HOSPITAL_COMMUNITY)
Admission: RE | Admit: 2019-02-15 | Discharge: 2019-02-15 | Disposition: A | Discharge status: Home / Self Care | Payer: Medicare Other | Source: Ambulatory Visit | Attending: Rheumatology | Admitting: Rheumatology

## 2019-02-15 ENCOUNTER — Encounter (HOSPITAL_COMMUNITY): Payer: Self-pay

## 2019-02-15 ENCOUNTER — Encounter (HOSPITAL_COMMUNITY): Payer: Medicare Other

## 2019-02-15 DIAGNOSIS — M0579 Rheumatoid arthritis with rheumatoid factor of multiple sites without organ or systems involvement: Secondary | ICD-10-CM | POA: Diagnosis not present

## 2019-02-15 DIAGNOSIS — Z79899 Other long term (current) drug therapy: Secondary | ICD-10-CM

## 2019-02-15 LAB — CBC WITH DIFFERENTIAL/PLATELET
Abs Immature Granulocytes: 0.02 10*3/uL (ref 0.00–0.07)
Basophils Absolute: 0.1 10*3/uL (ref 0.0–0.1)
Basophils Relative: 1 %
Eosinophils Absolute: 0.6 10*3/uL — ABNORMAL HIGH (ref 0.0–0.5)
Eosinophils Relative: 9 %
HCT: 37.6 % (ref 36.0–46.0)
HEMOGLOBIN: 11.9 g/dL — AB (ref 12.0–15.0)
Immature Granulocytes: 0 %
Lymphocytes Relative: 17 %
Lymphs Abs: 1.2 10*3/uL (ref 0.7–4.0)
MCH: 28.3 pg (ref 26.0–34.0)
MCHC: 31.6 g/dL (ref 30.0–36.0)
MCV: 89.5 fL (ref 80.0–100.0)
MONO ABS: 0.8 10*3/uL (ref 0.1–1.0)
Monocytes Relative: 11 %
Neutro Abs: 4.6 10*3/uL (ref 1.7–7.7)
Neutrophils Relative %: 62 %
Platelets: 169 10*3/uL (ref 150–400)
RBC: 4.2 MIL/uL (ref 3.87–5.11)
RDW: 13.8 % (ref 11.5–15.5)
WBC: 7.3 10*3/uL (ref 4.0–10.5)
nRBC: 0 % (ref 0.0–0.2)

## 2019-02-15 MED ORDER — DIPHENHYDRAMINE HCL 25 MG PO CAPS: 25.0000 mg | ORAL_CAPSULE | ORAL | Status: DC

## 2019-02-15 MED ORDER — SODIUM CHLORIDE 0.9 % IV SOLN
Freq: Once | INTRAVENOUS | Status: AC
  Administered 2019-02-15: 09:00:00 via INTRAVENOUS

## 2019-02-15 MED ORDER — SODIUM CHLORIDE 0.9 % IV SOLN
750.0000 mg | INTRAVENOUS | Status: DC
  Administered 2019-02-15: 750 mg via INTRAVENOUS
  Filled 2019-02-15: qty 30

## 2019-02-15 MED ORDER — ACETAMINOPHEN 325 MG PO TABS: 650.0000 mg | ORAL_TABLET | ORAL | Status: DC

## 2019-02-15 NOTE — Progress Notes (Signed)
Hgb is borderline low.  We will continue to monitor.

## 2019-02-17 ENCOUNTER — Telehealth: Payer: Self-pay

## 2019-02-17 DIAGNOSIS — Z6829 Body mass index (BMI) 29.0-29.9, adult: Secondary | ICD-10-CM | POA: Diagnosis not present

## 2019-02-17 DIAGNOSIS — I10 Essential (primary) hypertension: Secondary | ICD-10-CM | POA: Diagnosis not present

## 2019-02-17 DIAGNOSIS — J449 Chronic obstructive pulmonary disease, unspecified: Secondary | ICD-10-CM | POA: Diagnosis not present

## 2019-02-17 DIAGNOSIS — Z299 Encounter for prophylactic measures, unspecified: Secondary | ICD-10-CM | POA: Diagnosis not present

## 2019-02-17 DIAGNOSIS — R911 Solitary pulmonary nodule: Secondary | ICD-10-CM | POA: Diagnosis not present

## 2019-02-17 DIAGNOSIS — R0981 Nasal congestion: Secondary | ICD-10-CM | POA: Diagnosis not present

## 2019-02-17 LAB — QUANTIFERON-TB GOLD PLUS (RQFGPL)
QUANTIFERON NIL VALUE: 0.02 [IU]/mL
QuantiFERON Mitogen Value: >10 IU/mL
QuantiFERON TB1 Ag Value: 0.02 IU/mL
QuantiFERON TB2 Ag Value: 0.02 IU/mL

## 2019-02-17 LAB — QUANTIFERON-TB GOLD PLUS: QuantiFERON-TB Gold Plus: NEGATIVE

## 2019-02-17 NOTE — Telephone Encounter (Signed)
Called patient to advise her of TB gold result and patient states she had her orencia infusion on 02/15/2019 and has a "rash" on her legs and a "bump" behind her left knee. Patient states the "rash" has appeared after the last two infusions. Patient states she has used benadryl cream and taken benadryl. Patient states the rash is not bothersome but she wanted the office to be aware. Please advise.

## 2019-02-17 NOTE — Progress Notes (Signed)
TB gold negative

## 2019-02-20 NOTE — Telephone Encounter (Signed)
Please make sure that pt is taking Benadryl and Tylenol prior to infusion.

## 2019-02-20 NOTE — Telephone Encounter (Signed)
Patient advised to make sure she is taking the Benadryl and Tylenol prior to infusion. Patient states she does take it.

## 2019-03-01 DIAGNOSIS — I700 Atherosclerosis of aorta: Secondary | ICD-10-CM | POA: Diagnosis not present

## 2019-03-01 DIAGNOSIS — R918 Other nonspecific abnormal finding of lung field: Secondary | ICD-10-CM | POA: Diagnosis not present

## 2019-03-01 DIAGNOSIS — K449 Diaphragmatic hernia without obstruction or gangrene: Secondary | ICD-10-CM | POA: Diagnosis not present

## 2019-03-01 DIAGNOSIS — J479 Bronchiectasis, uncomplicated: Secondary | ICD-10-CM | POA: Diagnosis not present

## 2019-03-07 ENCOUNTER — Telehealth: Payer: Self-pay | Admitting: Rheumatology

## 2019-03-07 NOTE — Telephone Encounter (Signed)
Regarding your medications we recommend you continue them at this time as prescribed. In the event you do become sick please follow recommended procedure to hold medication until symptoms have resolved.  We understand concerns about increased risk of infection. If you do choose to stop your medications understand you are putting yourself at risk for a flare. Please call the office for any questions or acute issues. We are allowing some flexibly for labs and follow up visit. If you are having current respiratory symptoms do not come to the office, please utilize e-visits and your PCP for appropriate assessment and testing.

## 2019-03-07 NOTE — Telephone Encounter (Signed)
Patient scheduled for her Orencia infusion this coming week. Patient would like to know what doctor recommends concerning this. Is she okay to get infusion? Please call to advise.

## 2019-03-15 ENCOUNTER — Encounter (HOSPITAL_COMMUNITY)
Admission: RE | Admit: 2019-03-15 | Discharge: 2019-03-15 | Disposition: A | Discharge status: Home / Self Care | Payer: Medicare Other | Source: Ambulatory Visit | Attending: Rheumatology | Admitting: Rheumatology

## 2019-03-15 ENCOUNTER — Encounter (HOSPITAL_COMMUNITY): Payer: Self-pay

## 2019-03-15 ENCOUNTER — Other Ambulatory Visit: Payer: Self-pay

## 2019-03-15 DIAGNOSIS — M0579 Rheumatoid arthritis with rheumatoid factor of multiple sites without organ or systems involvement: Secondary | ICD-10-CM | POA: Diagnosis not present

## 2019-03-15 MED ORDER — SODIUM CHLORIDE 0.9 % IV SOLN
750.0000 mg | INTRAVENOUS | Status: DC
  Administered 2019-03-15: 750 mg via INTRAVENOUS
  Filled 2019-03-15: qty 30

## 2019-03-15 MED ORDER — SODIUM CHLORIDE 0.9 % IV SOLN
Freq: Once | INTRAVENOUS | Status: AC
  Administered 2019-03-15: 08:00:00 via INTRAVENOUS

## 2019-04-07 ENCOUNTER — Other Ambulatory Visit: Payer: Self-pay | Admitting: *Deleted

## 2019-04-07 DIAGNOSIS — M0579 Rheumatoid arthritis with rheumatoid factor of multiple sites without organ or systems involvement: Secondary | ICD-10-CM

## 2019-04-09 DIAGNOSIS — Z79899 Other long term (current) drug therapy: Secondary | ICD-10-CM | POA: Diagnosis not present

## 2019-04-09 DIAGNOSIS — I10 Essential (primary) hypertension: Secondary | ICD-10-CM | POA: Diagnosis not present

## 2019-04-09 DIAGNOSIS — M069 Rheumatoid arthritis, unspecified: Secondary | ICD-10-CM | POA: Diagnosis not present

## 2019-04-09 DIAGNOSIS — L03211 Cellulitis of face: Secondary | ICD-10-CM | POA: Diagnosis not present

## 2019-04-10 ENCOUNTER — Telehealth: Payer: Self-pay | Admitting: Rheumatology

## 2019-04-10 NOTE — Telephone Encounter (Signed)
Patient calling to let you know she went to ER yesterday due to Cellulitis. Patient was put on Clindamycin for 7 days, starting yesterday.  Patient is scheduled for infusion on Wednesday. Can patient still get infusion? Please call to advise.

## 2019-04-10 NOTE — Telephone Encounter (Signed)
Patient should finish her antibiotics and then the infection completely clears up then she can get her next infusion.  She will have to get clearance from her PCP.

## 2019-04-10 NOTE — Telephone Encounter (Signed)
Advised patient of information below and patient verbalized understanding. Patient will go ahead and contact her PCP to get an appointment on the books at the end of antibiotic course. Patient will cancel infusion and hold off on rescheduling until she receives clearance.

## 2019-04-12 ENCOUNTER — Inpatient Hospital Stay (HOSPITAL_COMMUNITY): Admission: RE | Admit: 2019-04-12 | Payer: Medicare Other | Source: Ambulatory Visit

## 2019-04-18 DIAGNOSIS — Z713 Dietary counseling and surveillance: Secondary | ICD-10-CM | POA: Diagnosis not present

## 2019-04-18 DIAGNOSIS — I10 Essential (primary) hypertension: Secondary | ICD-10-CM | POA: Diagnosis not present

## 2019-04-18 DIAGNOSIS — Z6829 Body mass index (BMI) 29.0-29.9, adult: Secondary | ICD-10-CM | POA: Diagnosis not present

## 2019-04-18 DIAGNOSIS — Z299 Encounter for prophylactic measures, unspecified: Secondary | ICD-10-CM | POA: Diagnosis not present

## 2019-04-18 DIAGNOSIS — L03211 Cellulitis of face: Secondary | ICD-10-CM | POA: Diagnosis not present

## 2019-04-25 ENCOUNTER — Telehealth: Payer: Self-pay | Admitting: Rheumatology

## 2019-04-25 DIAGNOSIS — M069 Rheumatoid arthritis, unspecified: Secondary | ICD-10-CM | POA: Diagnosis not present

## 2019-04-25 DIAGNOSIS — Z6829 Body mass index (BMI) 29.0-29.9, adult: Secondary | ICD-10-CM | POA: Diagnosis not present

## 2019-04-25 DIAGNOSIS — L03211 Cellulitis of face: Secondary | ICD-10-CM | POA: Diagnosis not present

## 2019-04-25 DIAGNOSIS — Z299 Encounter for prophylactic measures, unspecified: Secondary | ICD-10-CM | POA: Diagnosis not present

## 2019-04-25 DIAGNOSIS — I10 Essential (primary) hypertension: Secondary | ICD-10-CM | POA: Diagnosis not present

## 2019-04-25 DIAGNOSIS — L719 Rosacea, unspecified: Secondary | ICD-10-CM | POA: Diagnosis not present

## 2019-04-25 NOTE — Telephone Encounter (Signed)
If patient has clearance from her PCP then it would be okay for her to infuse on Friday.

## 2019-04-25 NOTE — Telephone Encounter (Signed)
Patient called stating last Tuesday 04/18/19 she had appointment with Dr. Brigitte Pulse and he put her on a 2nd course of antibiotics because her cellulitis on her face had not cleared up.  Patient states she finished the antibiotics yesterday and was given the all clear from Dr. Brigitte Pulse to have her infusion on Friday, 04/28/19.  Patient stated if Dr. Estanislado Pandy needs her to reschedule the appointment to call her back.

## 2019-04-25 NOTE — Telephone Encounter (Signed)
Patient advised as long as she has clearance from her PCP then it would be okay for her to infuse on Friday. Patient verbalized understanding.

## 2019-04-26 ENCOUNTER — Encounter (HOSPITAL_COMMUNITY): Payer: Medicare Other

## 2019-04-28 ENCOUNTER — Encounter (HOSPITAL_COMMUNITY)
Admission: RE | Admit: 2019-04-28 | Discharge: 2019-04-28 | Disposition: A | Discharge status: Home / Self Care | Payer: Medicare Other | Source: Ambulatory Visit | Attending: Rheumatology | Admitting: Rheumatology

## 2019-04-28 ENCOUNTER — Encounter (HOSPITAL_COMMUNITY): Payer: Self-pay

## 2019-04-28 ENCOUNTER — Other Ambulatory Visit: Payer: Self-pay

## 2019-04-28 DIAGNOSIS — M0579 Rheumatoid arthritis with rheumatoid factor of multiple sites without organ or systems involvement: Secondary | ICD-10-CM | POA: Insufficient documentation

## 2019-04-28 LAB — COMPREHENSIVE METABOLIC PANEL WITH GFR
ALT: 27 U/L (ref 0–44)
AST: 28 U/L (ref 15–41)
Albumin: 3.8 g/dL (ref 3.5–5.0)
Alkaline Phosphatase: 92 U/L (ref 38–126)
Anion gap: 9 (ref 5–15)
BUN: 26 mg/dL — ABNORMAL HIGH (ref 8–23)
CO2: 26 mmol/L (ref 22–32)
Calcium: 9.2 mg/dL (ref 8.9–10.3)
Chloride: 103 mmol/L (ref 98–111)
Creatinine, Ser: 0.7 mg/dL (ref 0.44–1.00)
GFR calc Af Amer: >60 mL/min
GFR calc non Af Amer: >60 mL/min
Glucose, Bld: 83 mg/dL (ref 70–99)
Potassium: 4.5 mmol/L (ref 3.5–5.1)
Sodium: 138 mmol/L (ref 135–145)
Total Bilirubin: 0.5 mg/dL (ref 0.3–1.2)
Total Protein: 6.1 g/dL — ABNORMAL LOW (ref 6.5–8.1)

## 2019-04-28 LAB — CBC
HCT: 41.1 % (ref 36.0–46.0)
Hemoglobin: 12.7 g/dL (ref 12.0–15.0)
MCH: 27.8 pg (ref 26.0–34.0)
MCHC: 30.9 g/dL (ref 30.0–36.0)
MCV: 89.9 fL (ref 80.0–100.0)
Platelets: 194 K/uL (ref 150–400)
RBC: 4.57 MIL/uL (ref 3.87–5.11)
RDW: 13.4 % (ref 11.5–15.5)
WBC: 6.6 K/uL (ref 4.0–10.5)
nRBC: 0 % (ref 0.0–0.2)

## 2019-04-28 MED ORDER — SODIUM CHLORIDE 0.9 % IV SOLN
750.0000 mg | INTRAVENOUS | Status: DC
  Administered 2019-04-28: 750 mg via INTRAVENOUS
  Filled 2019-04-28: qty 30

## 2019-04-28 MED ORDER — SODIUM CHLORIDE 0.9 % IV SOLN
Freq: Once | INTRAVENOUS | Status: AC
  Administered 2019-04-28: 09:00:00 via INTRAVENOUS

## 2019-04-28 MED ORDER — DIPHENHYDRAMINE HCL 25 MG PO CAPS: 25.0000 mg | ORAL_CAPSULE | ORAL | Status: DC

## 2019-04-28 MED ORDER — ACETAMINOPHEN 325 MG PO TABS: 650.0000 mg | ORAL_TABLET | ORAL | Status: DC

## 2019-05-11 ENCOUNTER — Other Ambulatory Visit (HOSPITAL_COMMUNITY): Payer: Self-pay | Admitting: Internal Medicine

## 2019-05-11 DIAGNOSIS — Z1231 Encounter for screening mammogram for malignant neoplasm of breast: Secondary | ICD-10-CM

## 2019-05-17 ENCOUNTER — Other Ambulatory Visit: Payer: Self-pay | Admitting: *Deleted

## 2019-05-17 DIAGNOSIS — M0579 Rheumatoid arthritis with rheumatoid factor of multiple sites without organ or systems involvement: Secondary | ICD-10-CM

## 2019-05-17 NOTE — Progress Notes (Signed)
Office Visit Note  Patient: Tiffany Pollard             Date of Birth: 09-20-1951           MRN: [id number]             PCP: Monico Blitz, MD Referring: Monico Blitz, MD Visit Date: 05/31/2019 Occupation: @GUAROCC @  Subjective:  Pain in both hands and both feet    History of Present Illness: Tiffany Pollard is a 68 y.o. female with history of seropositive rheumatoid arthritis and osteoarthritis.  She is on IV Orencia infusions every 28 days.  Her last infusion was on 05/26/19.  Patient reports that in May she was diagnosed with cellulitis and started on clindamycin.  She did undergo 2 rounds of antibiotics to clear the infection.  She missed 1 infusion of Orencia during this time.  She continues to have pain and intermittent joint swelling in bilateral hands and bilateral feet.  She is occasional knee joint pain.  She has been experiencing nocturnal pain that is waking her up 2-3 times per week.  She does not feel as though Tiffany Pollard is as effective as Enbrel was in the past but notes her insurance would no longer cover Orencia.  She takes Tylenol and Aleve as needed for pain relief.  She reports her PCP ordered a DEXA scan in January 2018 and she started on Fosamax which she took for 4 to 5 months.  She states that she discontinued Fosamax due to not tolerating side effects of reflux.  She continues to take calcium.  She has not had an updated DEXA.  Activities of Daily Living:  Patient reports morning stiffness for 1 hour.   Patient Reports nocturnal pain.  Difficulty dressing/grooming: Denies Difficulty climbing stairs: Denies Difficulty getting out of chair: Reports Difficulty using hands for taps, buttons, cutlery, and/or writing: Reports  Review of Systems  Constitutional: Negative for fatigue.  HENT: Positive for mouth dryness. Negative for mouth sores and nose dryness.   Eyes: Positive for dryness. Negative for pain and visual disturbance.  Respiratory: Negative for cough,  hemoptysis, shortness of breath and difficulty breathing.   Cardiovascular: Negative for chest pain, palpitations, hypertension and swelling in legs/feet.  Gastrointestinal: Negative for blood in stool, constipation and diarrhea.  Endocrine: Negative for increased urination.  Genitourinary: Negative for painful urination.  Musculoskeletal: Positive for arthralgias, joint pain, joint swelling and morning stiffness. Negative for myalgias, muscle weakness, muscle tenderness and myalgias.  Skin: Negative for color change, pallor, rash, hair loss, nodules/bumps, skin tightness, ulcers and sensitivity to sunlight.  Allergic/Immunologic: Negative for susceptible to infections.  Neurological: Negative for dizziness, numbness, headaches and weakness.  Hematological: Negative for swollen glands.  Psychiatric/Behavioral: Positive for sleep disturbance. Negative for depressed mood. The patient is nervous/anxious.     PMFS History:  Patient Active Problem List   Diagnosis Date Noted   Primary osteoarthritis of both knees 04/30/2017   History of anemia 04/30/2017   History of migraine 04/30/2017   History of vertigo 04/30/2017   History of gastroesophageal reflux (GERD) 04/30/2017   History of kidney stones 04/30/2017   History of IBS 04/30/2017   History of hyperlipidemia 04/30/2017   Hypercalcemia 04/30/2017   Rheumatoid arthritis involving multiple sites with positive rheumatoid factor ([place]) 12/08/2016   High risk medication use 12/08/2016   Primary osteoarthritis of both hands 12/08/2016   Primary osteoarthritis of both feet 12/08/2016   Osteoarthritis of lumbar spine 12/08/2016   Former smoker 12/08/2016  Abdominal pain, chronic, right lower quadrant 12/11/2013    Past Medical History:  Diagnosis Date   GERD (gastroesophageal reflux disease)    High blood pressure    Rheumatoid arthritis(714.0)     History reviewed. No pertinent family history. Past Surgical  History:  Procedure Laterality Date   BACK SURGERY     CERVICAL CONIZATION W/BX     COLONOSCOPY N/A 12/27/2013   Procedure: COLONOSCOPY;  Surgeon: Rogene Houston, MD;  Location: [facility];  Service: Endoscopy;  Laterality: N/A;  320-moved to 125 Ann to notify pt   FINGER FRACTURE SURGERY     foot surgery for a bunion     NASAL SINUS SURGERY     TONSILLECTOMY     Social History   Social History Narrative   Not on file   Immunization History  Administered Date(s) Administered   Influenza,inj,Quad PF,6+ Mos 08/18/2017   Pneumococcal Conjugate-13 07/13/2018     Objective: Vital Signs: BP 116/70 (BP Location: Left Arm, Patient Position: Sitting, Cuff Size: Normal)    Pulse 80    Resp 13    Ht 5\' 5"  (1.651 m)    Wt 176 lb (79.8 kg)    BMI 29.29 kg/m    Physical Exam Vitals signs and nursing note reviewed.  Constitutional:      Appearance: She is well-developed.  HENT:     Head: Normocephalic and atraumatic.  Eyes:     Conjunctiva/sclera: Conjunctivae normal.  Neck:     Musculoskeletal: Normal range of motion.  Cardiovascular:     Rate and Rhythm: Normal rate and regular rhythm.     Heart sounds: Normal heart sounds.  Pulmonary:     Effort: Pulmonary effort is normal.     Breath sounds: Normal breath sounds.  Abdominal:     General: Bowel sounds are normal.     Palpations: Abdomen is soft.  Lymphadenopathy:     Cervical: No cervical adenopathy.  Skin:    General: Skin is warm and dry.     Capillary Refill: Capillary refill takes less than 2 seconds.  Neurological:     Mental Status: She is alert and oriented to person, place, and time.  Psychiatric:        Behavior: Behavior normal.      Musculoskeletal Exam: C-spine good ROM.  Postural thoracic kyphosis noted.  Good ROM of lumbar spine.  Shoulder joints, elbow joints, wrist joints, MCPs, PIPs, and DIPs good ROM with no synovitis. PIP and DIP synovial thickening consistent with osteoarthritis of both  hands.  Complete fist formation bilaterally.  Hip joints, knee joints, ankle joints good ROM with no synovitis.  No warmth or effusion of knee joints.  No tenderness or swelling of ankle joints.  Hammertoes present. Tenderness over the right trochanteric bursa.   CDAI Exam: CDAI Score: 1  Patient Global: 5 mm; Provider Global: 5 mm Swollen: 0 ; Tender: 0  Joint Exam   No joint exam has been documented for this visit   There is currently no information documented on the homunculus. Go to the Rheumatology activity and complete the homunculus joint exam.  Investigation: No additional findings.  Imaging: No results found.  Recent Labs: Lab Results  Component Value Date   WBC 6.6 04/28/2019   HGB 12.7 04/28/2019   PLT 194 04/28/2019   NA 138 04/28/2019   K 4.5 04/28/2019   CL 103 04/28/2019   CO2 26 04/28/2019   GLUCOSE 83 04/28/2019   BUN 26 (H)  04/28/2019   CREATININE 0.70 04/28/2019   BILITOT 0.5 04/28/2019   ALKPHOS 92 04/28/2019   AST 28 04/28/2019   ALT 27 04/28/2019   PROT 6.1 (L) 04/28/2019   ALBUMIN 3.8 04/28/2019   CALCIUM 9.2 04/28/2019   GFRAA >60 04/28/2019   QFTBGOLD Negative 03/11/2017   QFTBGOLDPLUS Negative 02/15/2019    Speciality Comments: No specialty comments available.  Procedures:  No procedures performed Allergies: Tetanus toxoid    Assessment / Plan:     Visit Diagnoses: Rheumatoid arthritis involving multiple sites with positive rheumatoid factor (HCC) - +RF +CCP Ab, -ANA: She has no synovitis on exam today.  She continues to receive Orencia IV infusions every 4 weeks.  Her most recent infusion was on 05/26/2019.  She reports that she has breakthrough pain a little over 1 week prior to her infusions.  She has intermittent pain and swelling in bilateral hands and bilateral feet.  She has PIP and DIP synovial thickening consistent with osteoarthritis of bilateral hands and bilateral feet.  Hammertoes present.  She feels as though Orencia infusions  have been more effective than Simponi Aria but less effective than Enbrel was in the past.  X-rays of both hands and feet were obtained today to assess for interval radiographic changes.- Plan: XR Hand 2 View Left, XR Hand 2 View Right, XR Foot 2 Views Left, XR Foot 2 Views Right  High risk medication use - Orencia infusions every 4 weeks, d/c Simponi Aria IV due to recurrent infections, d/c Enbrel due to lack of insurance coverage.  CBC and CMP were drawn on 04/28/2019.  TB gold was negative on 02/15/2019.  She has routine lab work drawn with her infusions.  Standing orders are in place.  Primary osteoarthritis of both hands - She has PIP and DIP synovial thickening consistent with osteoarthritis of both hands.  She has complete fist formation bilaterally.  She has intermittent pain and swelling of both hands.  She has no synovitis on exam today.   Primary osteoarthritis of both knees -She has occasional discomfort in both knee joints.  She has no warmth or effusion of knee joints.  She has good ROM on exam.  She has been walking 2 miles daily, which has been causing some increased discomfort in both knee joints.  We discussed the importance of joint protection and muscle strengthening.   Primary osteoarthritis of both feet -She has PIP and DIPs (with osteoarthritis of bilateral feet.  She has first MTP joint synovial thickening.  Hammertoes present.  She has intermittent pain and swelling in bilateral feet.  She has no synovitis or tenderness at this time.  We discussed the importance of wearing proper fitting shoes.  She has started walking 2 miles daily for exercise.  Spondylosis of lumbar region without myelopathy or radiculopathy - Chronic pain   Age-related osteoporosis without current pathological fracture: DEXA on 01/05/17 ordered by PCP: [facility]ine BMD 0.869 T-score -2.8.  She is started on Fosamax 70 mg 1 tablet by mouth once weekly which she took for 4 to 5 months and discontinued due to  experiencing reflux.  She has not been on any treatment since then.  She continues to take calcium on a daily basis.  She was advised to recheck her PCP to schedule an updated DEXA.   Other medical conditions are listed as follows:  Former smoker   History of vertigo   History of anemia   History of migraine   History of gastroesophageal reflux (GERD)  History of kidney stones   History of hyperlipidemia   History of IBS   Orders: Orders Placed This Encounter  Procedures   XR Hand 2 View Left   XR Hand 2 View Right   XR Foot 2 Views Left   XR Foot 2 Views Right   No orders of the defined types were placed in this encounter.   Face-to-face time spent with patient was 30 minutes. Greater than 50% of time was spent in counseling and coordination of care.  Follow-Up Instructions: Return in 5 months (on 10/31/2019) for Rheumatoid arthritis, Osteoarthritis.   Ofilia Neas, PA-C   I examined and evaluated the patient with Tiffany Sams PA.  Patient is on IV Orencia.  She had no synovitis on examination.  Although she gets history of intermittent discomfort.  The plan of care was discussed as noted above.  Bo Merino, MD  Note - This record has been created using Editor, commissioning.  Chart creation errors have been sought, but may not always  have been located. Such creation errors do not reflect on  the standard of medical care.

## 2019-05-24 ENCOUNTER — Encounter (HOSPITAL_COMMUNITY): Payer: Medicare Other

## 2019-05-26 ENCOUNTER — Other Ambulatory Visit: Payer: Self-pay

## 2019-05-26 ENCOUNTER — Encounter (HOSPITAL_COMMUNITY): Payer: Self-pay

## 2019-05-26 ENCOUNTER — Encounter (HOSPITAL_COMMUNITY)
Admission: RE | Admit: 2019-05-26 | Discharge: 2019-05-26 | Disposition: A | Discharge status: Home / Self Care | Payer: Medicare Other | Source: Ambulatory Visit | Attending: Rheumatology | Admitting: Rheumatology

## 2019-05-26 DIAGNOSIS — M0579 Rheumatoid arthritis with rheumatoid factor of multiple sites without organ or systems involvement: Secondary | ICD-10-CM | POA: Diagnosis not present

## 2019-05-26 MED ORDER — SODIUM CHLORIDE 0.9 % IV SOLN
Freq: Once | INTRAVENOUS | Status: AC
  Administered 2019-05-26: 08:00:00 via INTRAVENOUS

## 2019-05-26 MED ORDER — ACETAMINOPHEN 325 MG PO TABS: 650.0000 mg | ORAL_TABLET | ORAL | Status: DC

## 2019-05-26 MED ORDER — SODIUM CHLORIDE 0.9 % IV SOLN
750.0000 mg | INTRAVENOUS | Status: DC
  Administered 2019-05-26: 750 mg via INTRAVENOUS
  Filled 2019-05-26: qty 30

## 2019-05-26 MED ORDER — DIPHENHYDRAMINE HCL 25 MG PO CAPS: 25.0000 mg | ORAL_CAPSULE | ORAL | Status: DC

## 2019-05-31 ENCOUNTER — Other Ambulatory Visit: Payer: Self-pay

## 2019-05-31 ENCOUNTER — Ambulatory Visit: Payer: Self-pay

## 2019-05-31 ENCOUNTER — Ambulatory Visit (INDEPENDENT_AMBULATORY_CARE_PROVIDER_SITE_OTHER): Payer: Medicare Other

## 2019-05-31 ENCOUNTER — Ambulatory Visit (INDEPENDENT_AMBULATORY_CARE_PROVIDER_SITE_OTHER): Payer: Medicare Other | Admitting: Physician Assistant

## 2019-05-31 ENCOUNTER — Encounter: Payer: Self-pay | Admitting: Physician Assistant

## 2019-05-31 VITALS — BP 116/70 | HR 80 | Resp 13 | Ht 65.0 in | Wt 176.0 lb

## 2019-05-31 DIAGNOSIS — M0579 Rheumatoid arthritis with rheumatoid factor of multiple sites without organ or systems involvement: Secondary | ICD-10-CM | POA: Diagnosis not present

## 2019-05-31 DIAGNOSIS — M170 Bilateral primary osteoarthritis of knee: Secondary | ICD-10-CM | POA: Diagnosis not present

## 2019-05-31 DIAGNOSIS — Z87891 Personal history of nicotine dependence: Secondary | ICD-10-CM

## 2019-05-31 DIAGNOSIS — Z87898 Personal history of other specified conditions: Secondary | ICD-10-CM | POA: Diagnosis not present

## 2019-05-31 DIAGNOSIS — M19072 Primary osteoarthritis, left ankle and foot: Secondary | ICD-10-CM

## 2019-05-31 DIAGNOSIS — M47816 Spondylosis without myelopathy or radiculopathy, lumbar region: Secondary | ICD-10-CM | POA: Diagnosis not present

## 2019-05-31 DIAGNOSIS — Z8639 Personal history of other endocrine, nutritional and metabolic disease: Secondary | ICD-10-CM

## 2019-05-31 DIAGNOSIS — M810 Age-related osteoporosis without current pathological fracture: Secondary | ICD-10-CM

## 2019-05-31 DIAGNOSIS — Z8719 Personal history of other diseases of the digestive system: Secondary | ICD-10-CM

## 2019-05-31 DIAGNOSIS — M19041 Primary osteoarthritis, right hand: Secondary | ICD-10-CM

## 2019-05-31 DIAGNOSIS — Z87442 Personal history of urinary calculi: Secondary | ICD-10-CM | POA: Diagnosis not present

## 2019-05-31 DIAGNOSIS — Z79899 Other long term (current) drug therapy: Secondary | ICD-10-CM

## 2019-05-31 DIAGNOSIS — M19071 Primary osteoarthritis, right ankle and foot: Secondary | ICD-10-CM | POA: Diagnosis not present

## 2019-05-31 DIAGNOSIS — M19042 Primary osteoarthritis, left hand: Secondary | ICD-10-CM

## 2019-05-31 DIAGNOSIS — Z862 Personal history of diseases of the blood and blood-forming organs and certain disorders involving the immune mechanism: Secondary | ICD-10-CM

## 2019-05-31 DIAGNOSIS — Z8669 Personal history of other diseases of the nervous system and sense organs: Secondary | ICD-10-CM

## 2019-06-05 ENCOUNTER — Other Ambulatory Visit: Payer: Self-pay

## 2019-06-05 ENCOUNTER — Ambulatory Visit (HOSPITAL_COMMUNITY)
Admission: RE | Admit: 2019-06-05 | Discharge: 2019-06-05 | Disposition: A | Discharge status: Home / Self Care | Payer: Medicare Other | Source: Ambulatory Visit | Attending: Internal Medicine | Admitting: Internal Medicine

## 2019-06-05 DIAGNOSIS — Z1231 Encounter for screening mammogram for malignant neoplasm of breast: Secondary | ICD-10-CM | POA: Insufficient documentation

## 2019-06-08 ENCOUNTER — Ambulatory Visit: Payer: Self-pay | Admitting: Physician Assistant

## 2019-06-12 ENCOUNTER — Ambulatory Visit: Payer: Medicare Other | Admitting: Physician Assistant

## 2019-06-15 ENCOUNTER — Other Ambulatory Visit: Payer: Self-pay | Admitting: *Deleted

## 2019-06-15 DIAGNOSIS — M0579 Rheumatoid arthritis with rheumatoid factor of multiple sites without organ or systems involvement: Secondary | ICD-10-CM

## 2019-06-22 DIAGNOSIS — Z6829 Body mass index (BMI) 29.0-29.9, adult: Secondary | ICD-10-CM | POA: Diagnosis not present

## 2019-06-22 DIAGNOSIS — Z299 Encounter for prophylactic measures, unspecified: Secondary | ICD-10-CM | POA: Diagnosis not present

## 2019-06-22 DIAGNOSIS — J449 Chronic obstructive pulmonary disease, unspecified: Secondary | ICD-10-CM | POA: Diagnosis not present

## 2019-06-22 DIAGNOSIS — I10 Essential (primary) hypertension: Secondary | ICD-10-CM | POA: Diagnosis not present

## 2019-06-22 DIAGNOSIS — M069 Rheumatoid arthritis, unspecified: Secondary | ICD-10-CM | POA: Diagnosis not present

## 2019-06-23 ENCOUNTER — Encounter (HOSPITAL_COMMUNITY): Payer: Medicare Other

## 2019-06-27 ENCOUNTER — Other Ambulatory Visit: Payer: Self-pay

## 2019-06-27 ENCOUNTER — Encounter (HOSPITAL_COMMUNITY)
Admission: RE | Admit: 2019-06-27 | Discharge: 2019-06-27 | Disposition: A | Discharge status: Home / Self Care | Payer: Medicare Other | Source: Ambulatory Visit | Attending: Rheumatology | Admitting: Rheumatology

## 2019-06-27 DIAGNOSIS — M0579 Rheumatoid arthritis with rheumatoid factor of multiple sites without organ or systems involvement: Secondary | ICD-10-CM | POA: Insufficient documentation

## 2019-06-27 LAB — COMPREHENSIVE METABOLIC PANEL
ALT: 30 U/L (ref 0–44)
AST: 32 U/L (ref 15–41)
Albumin: 3.9 g/dL (ref 3.5–5.0)
Alkaline Phosphatase: 96 U/L (ref 38–126)
Anion gap: 7 (ref 5–15)
BUN: 28 mg/dL — ABNORMAL HIGH (ref 8–23)
CO2: 27 mmol/L (ref 22–32)
Calcium: 9.4 mg/dL (ref 8.9–10.3)
Chloride: 106 mmol/L (ref 98–111)
Creatinine, Ser: 0.7 mg/dL (ref 0.44–1.00)
GFR calc Af Amer: >60 mL/min (ref >60)
GFR calc non Af Amer: >60 mL/min (ref >60)
Glucose, Bld: 65 mg/dL — ABNORMAL LOW (ref 70–99)
Potassium: 4.4 mmol/L (ref 3.5–5.1)
Sodium: 140 mmol/L (ref 135–145)
Total Bilirubin: 0.8 mg/dL (ref 0.3–1.2)
Total Protein: 6.4 g/dL — ABNORMAL LOW (ref 6.5–8.1)

## 2019-06-27 LAB — CBC
HCT: 40.5 % (ref 36.0–46.0)
Hemoglobin: 12.7 g/dL (ref 12.0–15.0)
MCH: 27.6 pg (ref 26.0–34.0)
MCHC: 31.4 g/dL (ref 30.0–36.0)
MCV: 88 fL (ref 80.0–100.0)
Platelets: 186 10*3/uL (ref 150–400)
RBC: 4.6 MIL/uL (ref 3.87–5.11)
RDW: 13.6 % (ref 11.5–15.5)
WBC: 5.6 10*3/uL (ref 4.0–10.5)
nRBC: 0 % (ref 0.0–0.2)

## 2019-06-27 MED ORDER — ACETAMINOPHEN 325 MG PO TABS: 650.0000 mg | ORAL_TABLET | ORAL | Status: DC

## 2019-06-27 MED ORDER — SODIUM CHLORIDE 0.9 % IV SOLN
750.0000 mg | INTRAVENOUS | Status: DC
  Administered 2019-06-27: 750 mg via INTRAVENOUS
  Filled 2019-06-27: qty 30

## 2019-06-27 MED ORDER — DIPHENHYDRAMINE HCL 25 MG PO CAPS: 25.0000 mg | ORAL_CAPSULE | ORAL | Status: DC

## 2019-07-14 DIAGNOSIS — H04123 Dry eye syndrome of bilateral lacrimal glands: Secondary | ICD-10-CM | POA: Diagnosis not present

## 2019-07-14 DIAGNOSIS — H2513 Age-related nuclear cataract, bilateral: Secondary | ICD-10-CM | POA: Diagnosis not present

## 2019-07-21 ENCOUNTER — Other Ambulatory Visit: Payer: Self-pay | Admitting: *Deleted

## 2019-07-21 DIAGNOSIS — M0579 Rheumatoid arthritis with rheumatoid factor of multiple sites without organ or systems involvement: Secondary | ICD-10-CM

## 2019-07-25 ENCOUNTER — Other Ambulatory Visit: Payer: Self-pay

## 2019-07-25 ENCOUNTER — Encounter (HOSPITAL_COMMUNITY)
Admission: RE | Admit: 2019-07-25 | Discharge: 2019-07-25 | Disposition: A | Discharge status: Home / Self Care | Payer: Medicare Other | Source: Ambulatory Visit | Attending: Rheumatology | Admitting: Rheumatology

## 2019-07-25 ENCOUNTER — Encounter (HOSPITAL_COMMUNITY): Payer: Self-pay

## 2019-07-25 DIAGNOSIS — M0579 Rheumatoid arthritis with rheumatoid factor of multiple sites without organ or systems involvement: Secondary | ICD-10-CM | POA: Insufficient documentation

## 2019-07-25 MED ORDER — SODIUM CHLORIDE 0.9 % IV SOLN
Freq: Once | INTRAVENOUS | Status: AC
  Administered 2019-07-25: 08:00:00 via INTRAVENOUS

## 2019-07-25 MED ORDER — SODIUM CHLORIDE 0.9 % IV SOLN
750.0000 mg | INTRAVENOUS | Status: DC
  Administered 2019-07-25: 09:00:00 750 mg via INTRAVENOUS
  Filled 2019-07-25: qty 30

## 2019-07-25 MED ORDER — DIPHENHYDRAMINE HCL 25 MG PO CAPS: 25.0000 mg | ORAL_CAPSULE | ORAL | Status: DC

## 2019-07-25 MED ORDER — ACETAMINOPHEN 325 MG PO TABS: 650.0000 mg | ORAL_TABLET | ORAL | Status: DC

## 2019-08-07 DIAGNOSIS — Z23 Encounter for immunization: Secondary | ICD-10-CM | POA: Diagnosis not present

## 2019-08-18 ENCOUNTER — Other Ambulatory Visit: Payer: Self-pay | Admitting: *Deleted

## 2019-08-18 DIAGNOSIS — M0579 Rheumatoid arthritis with rheumatoid factor of multiple sites without organ or systems involvement: Secondary | ICD-10-CM

## 2019-08-18 NOTE — Progress Notes (Signed)
Infusion orders are current for patient CBC CMP Tylenol Benadryl appointments are up to date and follow up appointment  is scheduled TB gold not due yet.  

## 2019-08-22 ENCOUNTER — Encounter (HOSPITAL_COMMUNITY): Payer: Self-pay

## 2019-08-22 ENCOUNTER — Other Ambulatory Visit: Payer: Self-pay

## 2019-08-22 ENCOUNTER — Encounter (HOSPITAL_COMMUNITY)
Admission: RE | Admit: 2019-08-22 | Discharge: 2019-08-22 | Disposition: A | Discharge status: Home / Self Care | Payer: Medicare Other | Source: Ambulatory Visit | Attending: Rheumatology | Admitting: Rheumatology

## 2019-08-22 DIAGNOSIS — M0579 Rheumatoid arthritis with rheumatoid factor of multiple sites without organ or systems involvement: Secondary | ICD-10-CM | POA: Diagnosis not present

## 2019-08-22 LAB — COMPREHENSIVE METABOLIC PANEL
ALT: 30 U/L (ref 0–44)
AST: 33 U/L (ref 15–41)
Albumin: 3.7 g/dL (ref 3.5–5.0)
Alkaline Phosphatase: 91 U/L (ref 38–126)
Anion gap: 8 (ref 5–15)
BUN: 21 mg/dL (ref 8–23)
CO2: 26 mmol/L (ref 22–32)
Calcium: 9.3 mg/dL (ref 8.9–10.3)
Chloride: 107 mmol/L (ref 98–111)
Creatinine, Ser: 0.68 mg/dL (ref 0.44–1.00)
GFR calc Af Amer: >60 mL/min (ref >60)
GFR calc non Af Amer: >60 mL/min (ref >60)
Glucose, Bld: 111 mg/dL — ABNORMAL HIGH (ref 70–99)
Potassium: 4.3 mmol/L (ref 3.5–5.1)
Sodium: 141 mmol/L (ref 135–145)
Total Bilirubin: 0.5 mg/dL (ref 0.3–1.2)
Total Protein: 6.2 g/dL — ABNORMAL LOW (ref 6.5–8.1)

## 2019-08-22 LAB — CBC
HCT: 40.4 % (ref 36.0–46.0)
Hemoglobin: 12.5 g/dL (ref 12.0–15.0)
MCH: 27.5 pg (ref 26.0–34.0)
MCHC: 30.9 g/dL (ref 30.0–36.0)
MCV: 89 fL (ref 80.0–100.0)
Platelets: 174 10*3/uL (ref 150–400)
RBC: 4.54 MIL/uL (ref 3.87–5.11)
RDW: 13.5 % (ref 11.5–15.5)
WBC: 5.5 10*3/uL (ref 4.0–10.5)
nRBC: 0 % (ref 0.0–0.2)

## 2019-08-22 MED ORDER — ACETAMINOPHEN 325 MG PO TABS
650.0000 mg | ORAL_TABLET | ORAL | Status: AC
  Administered 2019-08-22: 650 mg via ORAL
  Filled 2019-08-22: qty 2

## 2019-08-22 MED ORDER — SODIUM CHLORIDE 0.9 % IV SOLN
750.0000 mg | Freq: Once | INTRAVENOUS | Status: AC
  Administered 2019-08-22: 09:00:00 750 mg via INTRAVENOUS
  Filled 2019-08-22: qty 30

## 2019-08-22 MED ORDER — SODIUM CHLORIDE 0.9 % IV SOLN
Freq: Once | INTRAVENOUS | Status: AC
  Administered 2019-08-22: 09:00:00 via INTRAVENOUS

## 2019-08-22 MED ORDER — DIPHENHYDRAMINE HCL 25 MG PO CAPS
25.0000 mg | ORAL_CAPSULE | ORAL | Status: AC
  Administered 2019-08-22: 08:00:00 25 mg via ORAL
  Filled 2019-08-22: qty 1

## 2019-09-08 ENCOUNTER — Other Ambulatory Visit: Payer: Self-pay | Admitting: *Deleted

## 2019-09-08 DIAGNOSIS — M0579 Rheumatoid arthritis with rheumatoid factor of multiple sites without organ or systems involvement: Secondary | ICD-10-CM

## 2019-09-19 ENCOUNTER — Other Ambulatory Visit: Payer: Self-pay

## 2019-09-19 ENCOUNTER — Encounter (HOSPITAL_COMMUNITY)
Admission: RE | Admit: 2019-09-19 | Discharge: 2019-09-19 | Disposition: A | Discharge status: Home / Self Care | Payer: Medicare Other | Source: Ambulatory Visit | Attending: Rheumatology | Admitting: Rheumatology

## 2019-09-19 ENCOUNTER — Encounter (HOSPITAL_COMMUNITY): Payer: Self-pay

## 2019-09-19 DIAGNOSIS — M0579 Rheumatoid arthritis with rheumatoid factor of multiple sites without organ or systems involvement: Secondary | ICD-10-CM

## 2019-09-19 DIAGNOSIS — M069 Rheumatoid arthritis, unspecified: Secondary | ICD-10-CM | POA: Diagnosis not present

## 2019-09-19 MED ORDER — SODIUM CHLORIDE 0.9 % IV SOLN
Freq: Once | INTRAVENOUS | Status: AC
  Administered 2019-09-19: 09:00:00 via INTRAVENOUS

## 2019-09-19 MED ORDER — SODIUM CHLORIDE 0.9 % IV SOLN
750.0000 mg | INTRAVENOUS | Status: DC
  Administered 2019-09-19: 750 mg via INTRAVENOUS
  Filled 2019-09-19: qty 30

## 2019-09-19 MED ORDER — DIPHENHYDRAMINE HCL 25 MG PO CAPS: 25.0000 mg | ORAL_CAPSULE | ORAL | Status: DC

## 2019-09-19 MED ORDER — ACETAMINOPHEN 325 MG PO TABS: 650.0000 mg | ORAL_TABLET | ORAL | Status: DC

## 2019-10-12 ENCOUNTER — Other Ambulatory Visit: Payer: Self-pay | Admitting: *Deleted

## 2019-10-12 DIAGNOSIS — M0579 Rheumatoid arthritis with rheumatoid factor of multiple sites without organ or systems involvement: Secondary | ICD-10-CM

## 2019-10-16 NOTE — Progress Notes (Signed)
Virtual Visit via Telephone Note  I connected with Tiffany Pollard on 10/30/19 at  8:40 AM EST by telephone and verified that I am speaking with the correct person using two identifiers.  Location: Patient: Home  Provider: Clinic  This service was conducted via virtual visit.   The patient was located at home. I was located in my office.  Consent was obtained prior to the virtual visit and is aware of possible charges through their insurance for this visit.  The patient is an established patient.  Dr. Estanislado Pandy, MD conducted the virtual visit and Hazel Sams, PA-C acted as scribe during the service.  Office staff helped with scheduling follow up visits after the service was conducted.   I discussed the limitations, risks, security and privacy concerns of performing an evaluation and management service by telephone and the availability of in person appointments. I also discussed with the patient that there may be a patient responsible charge related to this service. The patient expressed understanding and agreed to proceed.  CC: left wrist pain  History of Present Illness: History of Present Illness: Tiffany Pollard is a 68 y.o. female with history of seropositive rheumatoid arthritis, osteoporosis, and osteoarthritis.  She is on Orencia IV infusions 750 mg every 28 days. Her last infusion was on 10/17/19.  She states overall Orencia is more effective than Simponi.  She has not had any recent infections. She has been experiencing intermittent left wrist pain for the past several weeks.  She has been starting to wear a left wrist brace.  She continues to have discomfort intermittently in both hands and both knee joints.  She denies any joint swelling currently.      Review of Systems  Constitutional: Negative for fever and malaise/fatigue.  Eyes: Negative for photophobia, pain, discharge and redness.  Respiratory: Negative for cough, shortness of breath and wheezing.   Cardiovascular: Negative  for chest pain and palpitations.  Gastrointestinal: Negative for blood in stool, constipation and diarrhea.  Genitourinary: Negative for dysuria.  Musculoskeletal: Positive for back pain and joint pain. Negative for myalgias and neck pain.       +morning stiffness   Skin: Negative for rash.  Neurological: Negative for dizziness and headaches.  Psychiatric/Behavioral: Negative for depression. The patient has insomnia. The patient is not nervous/anxious.       Observations/Objective: Physical Exam  Constitutional: She is oriented to person, place, and time.  Neurological: She is alert and oriented to person, place, and time.  Psychiatric: Mood, memory, affect and judgment normal.   Patient reports morning stiffness for 1  hour.   Patient denies nocturnal pain.  Difficulty dressing/grooming: Denies Difficulty climbing stairs: Reports Difficulty getting out of chair: Reports Difficulty using hands for taps, buttons, cutlery, and/or writing: Reports    Assessment and Plan: Visit Diagnoses: Rheumatoid arthritis involving multiple sites with positive rheumatoid factor ([place]) - +RF +CCP Ab, -ANA: She has been having increased left wrist joint pain for the past 2 months.  She has been experiencing intermittent joint swelling in the left wrist joint.  She has been wearing a left wrist brace for support. She has occasional pain in both shoulder joints.  Overall she has noticed improvement since switching from Simponi IV infusions to Orencia 750 mg IV infusions every 28 days.  We will add on Plaquenil 200 mg 1 tablet BID M-F to the current treatment regimen.  Indications, contraindications, and potential side effects of PLQ were discussed.  We will mail the  consent form and informational handouts to the patient to review and return to our office.  She will follow up in 6 weeks to assess her response.   Patient was counseled on the purpose, proper use, and adverse effects of hydroxychloroquine  including nausea/diarrhea, skin rash, headaches, and sun sensitivity.  Discussed importance of annual eye exams while on hydroxychloroquine to monitor to ocular toxicity and discussed importance of frequent laboratory monitoring.  Provided patient with eye exam form for baseline ophthalmologic exam.  Provided patient with educational materials on hydroxychloroquine and answered all questions.  Patient consented to hydroxychloroquine.  Will upload consent in the media tab.    Dose will be Plaquenil 200 mg twice daily Monday through Friday.  Prescription pending lab results.  High risk medication use - Orencia 750 mg IV infusions every 28 days (last infusion was on 10/17/19).  She will be starting on Plaquenil.  d/c Simponi Aria IV due to recurrent infections, d/c Enbrel due to lack of insurance coverage.  CBC and CMP were stable on 10/17/19.  TB gold negative 02/15/19. She will return for lab work 1 month after starting on [place].  She will require a baseline PLQ eye exam.   Primary osteoarthritis of both hands - She is having increased left wrist joint pain and intermittent swelling for the past 2 months.  She has been wearing a left wrist brace. She has been having difficulty with ADLs due to the discomfort recently.   Primary osteoarthritis of both knees -She has intermittent bilateral knee joint pain. No joint swelling.  She has occasional difficulty getting up from a chair or getting up from a chair. Overall her knee joint pain has been better recently. She continues to walk 20-30 minutes daily.   Primary osteoarthritis of both feet -She has occasional pain in both feet.  She walks 20-30 minutes daily.   Spondylosis of lumbar region without myelopathy or radiculopathy - She has not had any lower back pain recently.  No symptoms of radiculopathy.   Age-related osteoporosis without current pathological fracture: DEXA on 01/05/17 ordered by PCP: [facility] Spine BMD 0.869 T-score -2.8.  She is started on  Fosamax 70 mg 1 tablet by mouth once weekly which she took for 4 to 5 months and discontinued due to experiencing reflux.  She has not been on any treatment since then.  She continues to take calcium on a daily basis. She is due to update DEXA.   Other medical conditions are listed as follows:  Former smoker   History of vertigo   History of anemia   History of migraine   History of gastroesophageal reflux (GERD)   History of kidney stones   History of hyperlipidemia   History of IBS   Follow Up Instructions: She will follow up in 6 weeks.    I discussed the assessment and treatment plan with the patient. The patient was provided an opportunity to ask questions and all were answered. The patient agreed with the plan and demonstrated an understanding of the instructions.   The patient was advised to call back or seek an in-person evaluation if the symptoms worsen or if the condition fails to improve as anticipated.  I provided 25 minutes of non-face-to-face time during this encounter.   Bo Merino, MD  Scribed by- Hazel Sams PA-C

## 2019-10-17 ENCOUNTER — Other Ambulatory Visit: Payer: Self-pay

## 2019-10-17 ENCOUNTER — Encounter (HOSPITAL_COMMUNITY)
Admission: RE | Admit: 2019-10-17 | Discharge: 2019-10-17 | Disposition: A | Discharge status: Home / Self Care | Payer: Medicare Other | Source: Ambulatory Visit | Attending: Rheumatology | Admitting: Rheumatology

## 2019-10-17 ENCOUNTER — Encounter (HOSPITAL_COMMUNITY): Payer: Self-pay

## 2019-10-17 DIAGNOSIS — M0579 Rheumatoid arthritis with rheumatoid factor of multiple sites without organ or systems involvement: Secondary | ICD-10-CM

## 2019-10-17 DIAGNOSIS — M069 Rheumatoid arthritis, unspecified: Secondary | ICD-10-CM | POA: Insufficient documentation

## 2019-10-17 LAB — CBC
HCT: 41.3 % (ref 36.0–46.0)
Hemoglobin: 13.3 g/dL (ref 12.0–15.0)
MCH: 28.4 pg (ref 26.0–34.0)
MCHC: 32.2 g/dL (ref 30.0–36.0)
MCV: 88.2 fL (ref 80.0–100.0)
Platelets: 201 10*3/uL (ref 150–400)
RBC: 4.68 MIL/uL (ref 3.87–5.11)
RDW: 13.8 % (ref 11.5–15.5)
WBC: 6.3 10*3/uL (ref 4.0–10.5)
nRBC: 0 % (ref 0.0–0.2)

## 2019-10-17 LAB — COMPREHENSIVE METABOLIC PANEL
ALT: 27 U/L (ref 0–44)
AST: 31 U/L (ref 15–41)
Albumin: 3.9 g/dL (ref 3.5–5.0)
Alkaline Phosphatase: 97 U/L (ref 38–126)
Anion gap: 9 (ref 5–15)
BUN: 20 mg/dL (ref 8–23)
CO2: 27 mmol/L (ref 22–32)
Calcium: 9.5 mg/dL (ref 8.9–10.3)
Chloride: 105 mmol/L (ref 98–111)
Creatinine, Ser: 0.73 mg/dL (ref 0.44–1.00)
GFR calc Af Amer: >60 mL/min (ref >60)
GFR calc non Af Amer: >60 mL/min (ref >60)
Glucose, Bld: 85 mg/dL (ref 70–99)
Potassium: 4.1 mmol/L (ref 3.5–5.1)
Sodium: 141 mmol/L (ref 135–145)
Total Bilirubin: 0.6 mg/dL (ref 0.3–1.2)
Total Protein: 6.4 g/dL — ABNORMAL LOW (ref 6.5–8.1)

## 2019-10-17 MED ORDER — DIPHENHYDRAMINE HCL 25 MG PO CAPS: 25.0000 mg | ORAL_CAPSULE | ORAL | Status: DC

## 2019-10-17 MED ORDER — SODIUM CHLORIDE 0.9 % IV SOLN
750.0000 mg | INTRAVENOUS | Status: DC
  Administered 2019-10-17: 750 mg via INTRAVENOUS
  Filled 2019-10-17: qty 30

## 2019-10-17 MED ORDER — SODIUM CHLORIDE 0.9 % IV SOLN
Freq: Once | INTRAVENOUS | Status: AC
  Administered 2019-10-17: 09:00:00 via INTRAVENOUS

## 2019-10-17 MED ORDER — ACETAMINOPHEN 325 MG PO TABS: 650.0000 mg | ORAL_TABLET | ORAL | Status: DC

## 2019-10-17 NOTE — Progress Notes (Signed)
Labs are stable.

## 2019-10-26 ENCOUNTER — Other Ambulatory Visit: Payer: Self-pay | Admitting: *Deleted

## 2019-10-26 DIAGNOSIS — M0579 Rheumatoid arthritis with rheumatoid factor of multiple sites without organ or systems involvement: Secondary | ICD-10-CM

## 2019-10-26 DIAGNOSIS — R5383 Other fatigue: Secondary | ICD-10-CM | POA: Diagnosis not present

## 2019-10-26 DIAGNOSIS — Z0000 Encounter for general adult medical examination without abnormal findings: Secondary | ICD-10-CM | POA: Diagnosis not present

## 2019-10-26 DIAGNOSIS — Z1339 Encounter for screening examination for other mental health and behavioral disorders: Secondary | ICD-10-CM | POA: Diagnosis not present

## 2019-10-26 DIAGNOSIS — Z299 Encounter for prophylactic measures, unspecified: Secondary | ICD-10-CM | POA: Diagnosis not present

## 2019-10-26 DIAGNOSIS — J449 Chronic obstructive pulmonary disease, unspecified: Secondary | ICD-10-CM | POA: Diagnosis not present

## 2019-10-26 DIAGNOSIS — Z6829 Body mass index (BMI) 29.0-29.9, adult: Secondary | ICD-10-CM | POA: Diagnosis not present

## 2019-10-26 DIAGNOSIS — E559 Vitamin D deficiency, unspecified: Secondary | ICD-10-CM | POA: Diagnosis not present

## 2019-10-26 DIAGNOSIS — Z1211 Encounter for screening for malignant neoplasm of colon: Secondary | ICD-10-CM | POA: Diagnosis not present

## 2019-10-26 DIAGNOSIS — Z1331 Encounter for screening for depression: Secondary | ICD-10-CM | POA: Diagnosis not present

## 2019-10-26 DIAGNOSIS — E7800 Pure hypercholesterolemia, unspecified: Secondary | ICD-10-CM | POA: Diagnosis not present

## 2019-10-26 DIAGNOSIS — Z7189 Other specified counseling: Secondary | ICD-10-CM | POA: Diagnosis not present

## 2019-10-26 DIAGNOSIS — Z79899 Other long term (current) drug therapy: Secondary | ICD-10-CM | POA: Diagnosis not present

## 2019-10-30 ENCOUNTER — Telehealth: Payer: Self-pay

## 2019-10-30 ENCOUNTER — Encounter: Payer: Self-pay | Admitting: Physician Assistant

## 2019-10-30 ENCOUNTER — Other Ambulatory Visit: Payer: Self-pay

## 2019-10-30 ENCOUNTER — Telehealth (INDEPENDENT_AMBULATORY_CARE_PROVIDER_SITE_OTHER): Payer: Medicare Other | Admitting: Rheumatology

## 2019-10-30 DIAGNOSIS — M810 Age-related osteoporosis without current pathological fracture: Secondary | ICD-10-CM

## 2019-10-30 DIAGNOSIS — Z862 Personal history of diseases of the blood and blood-forming organs and certain disorders involving the immune mechanism: Secondary | ICD-10-CM

## 2019-10-30 DIAGNOSIS — M170 Bilateral primary osteoarthritis of knee: Secondary | ICD-10-CM | POA: Diagnosis not present

## 2019-10-30 DIAGNOSIS — M19042 Primary osteoarthritis, left hand: Secondary | ICD-10-CM

## 2019-10-30 DIAGNOSIS — Z79899 Other long term (current) drug therapy: Secondary | ICD-10-CM | POA: Diagnosis not present

## 2019-10-30 DIAGNOSIS — Z87891 Personal history of nicotine dependence: Secondary | ICD-10-CM

## 2019-10-30 DIAGNOSIS — Z8669 Personal history of other diseases of the nervous system and sense organs: Secondary | ICD-10-CM

## 2019-10-30 DIAGNOSIS — M0579 Rheumatoid arthritis with rheumatoid factor of multiple sites without organ or systems involvement: Secondary | ICD-10-CM | POA: Diagnosis not present

## 2019-10-30 DIAGNOSIS — M47816 Spondylosis without myelopathy or radiculopathy, lumbar region: Secondary | ICD-10-CM | POA: Diagnosis not present

## 2019-10-30 DIAGNOSIS — M19071 Primary osteoarthritis, right ankle and foot: Secondary | ICD-10-CM | POA: Diagnosis not present

## 2019-10-30 DIAGNOSIS — M19041 Primary osteoarthritis, right hand: Secondary | ICD-10-CM

## 2019-10-30 DIAGNOSIS — Z8719 Personal history of other diseases of the digestive system: Secondary | ICD-10-CM

## 2019-10-30 DIAGNOSIS — Z8639 Personal history of other endocrine, nutritional and metabolic disease: Secondary | ICD-10-CM

## 2019-10-30 DIAGNOSIS — M19072 Primary osteoarthritis, left ankle and foot: Secondary | ICD-10-CM

## 2019-10-30 DIAGNOSIS — Z87442 Personal history of urinary calculi: Secondary | ICD-10-CM

## 2019-10-30 DIAGNOSIS — Z87898 Personal history of other specified conditions: Secondary | ICD-10-CM

## 2019-10-30 NOTE — Patient Instructions (Addendum)
Standing Labs We placed an order today for your standing lab work.    Please come back and get your standing labs in 1 month after starting plaquenil and then every 3 months.   We have open lab daily Monday through Thursday from 8:30-12:30 PM and 1:30-4:30 PM and Friday from 8:30-12:30 PM and 1:30-4:00 PM at the office of Dr. Bo Merino.   You may experience shorter wait times on Monday and Friday afternoons. The office is located at [address] [place], [place], [place] [address] No appointment is necessary.   Labs are drawn by [facility].  You may receive a bill from [facility] for your lab work.  If you wish to have your labs drawn at another location, please call the office 24 hours in advance to send orders.  If you have any questions regarding directions or hours of operation,  please call [phone].   Just as a reminder please drink plenty of water prior to coming for your lab work. Thanks!    Hydroxychloroquine tablets What is this medicine? HYDROXYCHLOROQUINE (hye drox ee KLOR oh kwin) is used to treat rheumatoid arthritis and systemic lupus erythematosus. It is also used to treat malaria. This medicine may be used for other purposes; ask your health care provider or pharmacist if you have questions. COMMON BRAND NAME(S): Plaquenil, Quineprox What should I tell my health care provider before I take this medicine? They need to know if you have any of these conditions:  diabetes  eye disease, vision problems  G6PD deficiency  heart disease  history of irregular heartbeat  if you often drink alcohol  kidney disease  liver disease  porphyria  psoriasis  an unusual or allergic reaction to chloroquine, hydroxychloroquine, other medicines, foods, dyes, or preservatives  pregnant or trying to get pregnant  breast-feeding How should I use this medicine? Take this medicine by mouth with a glass of water. Follow the directions on the prescription label. Do  not cut, crush or chew this medicine. Swallow the tablets whole. Take this medicine with food. Avoid taking antacids within 4 hours of taking this medicine. It is best to separate these medicines by at least 4 hours. Take your medicine at regular intervals. Do not take it more often than directed. Take all of your medicine as directed even if you think you are better. Do not skip doses or stop your medicine early. Talk to your pediatrician regarding the use of this medicine in children. While this drug may be prescribed for selected conditions, precautions do apply. Overdosage: If you think you have taken too much of this medicine contact a poison control center or emergency room at once. NOTE: This medicine is only for you. Do not share this medicine with others. What if I miss a dose? If you miss a dose, take it as soon as you can. If it is almost time for your next dose, take only that dose. Do not take double or extra doses. What may interact with this medicine? Do not take this medicine with any of the following medications:  cisapride  dronedarone  pimozide  thioridazine This medicine may also interact with the following medications:  ampicillin  antacids  cimetidine  cyclosporine  digoxin  kaolin  medicines for diabetes, like insulin, glipizide, glyburide  medicines for seizures like carbamazepine, phenobarbital, phenytoin  mefloquine  methotrexate  other medicines that prolong the QT interval (cause an abnormal heart rhythm)  praziquantel This list may not describe all possible interactions. Give your  health care provider a list of all the medicines, herbs, non-prescription drugs, or dietary supplements you use. Also tell them if you smoke, drink alcohol, or use illegal drugs. Some items may interact with your medicine. What should I watch for while using this medicine? Visit your health care professional for regular checks on your progress. Tell your health care  professional if your symptoms do not start to get better or if they get worse. You may need blood work done while you are taking this medicine. If you take other medicines that can affect heart rhythm, you may need more testing. Talk to your health care professional if you have questions. Your vision may be tested before and during use of this medicine. Tell your health care professional right away if you have any change in your eyesight. What side effects may I notice from receiving this medicine? Side effects that you should report to your doctor or health care professional as soon as possible:  allergic reactions like skin rash, itching or hives, swelling of the face, lips, or tongue  changes in vision  decreased hearing or ringing of the ears  muscle weakness  redness, blistering, peeling or loosening of the skin, including inside the mouth  sensitivity to light  signs and symptoms of a dangerous change in heartbeat or heart rhythm like chest pain; dizziness; fast or irregular heartbeat; palpitations; feeling faint or lightheaded, falls; breathing problems  signs and symptoms of liver injury like dark yellow or brown urine; general ill feeling or flu-like symptoms; light-colored stools; loss of appetite; nausea; right upper belly pain; unusually weak or tired; yellowing of the eyes or skin  signs and symptoms of low blood sugar such as feeling anxious; confusion; dizziness; increased hunger; unusually weak or tired; sweating; shakiness; cold; irritable; headache; blurred vision; fast heartbeat; loss of consciousness  suicidal thoughts  uncontrollable head, mouth, neck, arm, or leg movements Side effects that usually do not require medical attention (report to your doctor or health care professional if they continue or are bothersome):  diarrhea  dizziness  hair loss  headache  irritable  loss of appetite  nausea, vomiting  stomach pain This list may not describe all  possible side effects. Call your doctor for medical advice about side effects. You may report side effects to FDA at 1-800-FDA-1088. Where should I keep my medicine? Keep out of the reach of children. Store at room temperature between 15 and 30 degrees C (59 and 86 degrees F). Protect from moisture and light. Throw away any unused medicine after the expiration date. NOTE: This sheet is a summary. It may not cover all possible information. If you have questions about this medicine, talk to your doctor, pharmacist, or health care provider.  2020 Elsevier/Gold Standard (2019-04-03 12:56:32)

## 2019-10-30 NOTE — Telephone Encounter (Signed)
Plaquenil prescription pending labs/consent.

## 2019-11-07 NOTE — Telephone Encounter (Signed)
Consent mailed to patient on 10/30/2019.

## 2019-11-10 NOTE — Telephone Encounter (Signed)
Patient states she received consent and mailed it to the office on 11/08/2019. Will document once received in the office.

## 2019-11-13 NOTE — Telephone Encounter (Signed)
Dr. Estanislado Pandy would like the patient to be evaluated by her PCP or neurologist urgently. She can wait to start on PLQ until after the evaluation.

## 2019-11-13 NOTE — Telephone Encounter (Signed)
Spoke with patient and advised patient Dr. Estanislado Pandy would like the patient to be evaluated by her PCP or neurologist urgently. She can wait to start on PLQ until after the evaluation. Patient verbalized understanding. Patient states the facial neuralgia has happened several times in the past so it is not a "new issue." patient will call tomorrow to get an appointment to be evaluated.   Patient also questioned if she should postpone her orencia infusion tomorrow, I called Hazel Sams, PA-C and spoke with her. Advised patient she can proceed with infusion if she was comfortable and patient states she will get the infusion tomorrow.

## 2019-11-13 NOTE — Telephone Encounter (Signed)
Received consent form. Will send to scan center.  Plaquenil dose discussed with patient, she verbalized understanding.   Patient states that yesterday and today she has experienced facial neuralgia and would like to know if this will be an issue with taking plaquenil? Please advise.

## 2019-11-14 ENCOUNTER — Encounter (HOSPITAL_COMMUNITY)
Admission: RE | Admit: 2019-11-14 | Discharge: 2019-11-14 | Disposition: A | Discharge status: Home / Self Care | Payer: Medicare Other | Source: Ambulatory Visit | Attending: Rheumatology | Admitting: Rheumatology

## 2019-11-14 ENCOUNTER — Other Ambulatory Visit: Payer: Self-pay

## 2019-11-14 DIAGNOSIS — Z299 Encounter for prophylactic measures, unspecified: Secondary | ICD-10-CM | POA: Diagnosis not present

## 2019-11-14 DIAGNOSIS — I10 Essential (primary) hypertension: Secondary | ICD-10-CM | POA: Diagnosis not present

## 2019-11-14 DIAGNOSIS — M069 Rheumatoid arthritis, unspecified: Secondary | ICD-10-CM | POA: Diagnosis not present

## 2019-11-14 DIAGNOSIS — R6884 Jaw pain: Secondary | ICD-10-CM | POA: Diagnosis not present

## 2019-11-14 DIAGNOSIS — M0579 Rheumatoid arthritis with rheumatoid factor of multiple sites without organ or systems involvement: Secondary | ICD-10-CM | POA: Insufficient documentation

## 2019-11-14 DIAGNOSIS — Z6829 Body mass index (BMI) 29.0-29.9, adult: Secondary | ICD-10-CM | POA: Diagnosis not present

## 2019-11-14 MED ORDER — DIPHENHYDRAMINE HCL 25 MG PO CAPS: 25.0000 mg | ORAL_CAPSULE | ORAL | Status: DC

## 2019-11-14 MED ORDER — SODIUM CHLORIDE 0.9 % IV SOLN
750.0000 mg | INTRAVENOUS | Status: DC
  Administered 2019-11-14: 750 mg via INTRAVENOUS
  Filled 2019-11-14: qty 30

## 2019-11-14 MED ORDER — ACETAMINOPHEN 325 MG PO TABS: 650.0000 mg | ORAL_TABLET | ORAL | Status: DC

## 2019-11-14 MED ORDER — SODIUM CHLORIDE 0.9 % IV SOLN
Freq: Once | INTRAVENOUS | Status: AC
  Administered 2019-11-14: 09:00:00 via INTRAVENOUS

## 2019-11-15 MED ORDER — HYDROXYCHLOROQUINE SULFATE 200 MG PO TABS: ORAL_TABLET | ORAL | 0 refills | Status: DC

## 2019-11-15 NOTE — Telephone Encounter (Signed)
Patient was evaluated on 11/14/2019 by her PCP, we received office note. PCP suggested warm compresses and a mouth guard due to patient gritting her teeth. Note was reviewed by [facility].   Spoke with patient and advised patient that plaquenil prescription has been sent to the pharmacy.

## 2019-11-15 NOTE — Addendum Note (Signed)
Addended by: Earnestine Mealing on: 11/15/2019 09:13 AM   Modules accepted: Orders

## 2019-11-20 ENCOUNTER — Telehealth: Payer: Self-pay | Admitting: *Deleted

## 2019-11-20 NOTE — Telephone Encounter (Signed)
Advised patient If she wants she can try Plaquenil half a tablet once a day.  If she still cannot tolerate it that she can discontinue it.  We will discuss treatment options when she is ready. Patient verbalized understanding.

## 2019-11-20 NOTE — Telephone Encounter (Signed)
If she wants she can try Plaquenil half a tablet once a day.  If she still cannot tolerate it that she can discontinue it.  We will discuss treatment options when she is ready.

## 2019-11-20 NOTE — Telephone Encounter (Signed)
New start on PLQ, having severe reflux, nausea, patient is going to discontinue PLQ. [phone].

## 2019-11-20 NOTE — Telephone Encounter (Signed)
Patient states she started PLQ on 11/16/2019 (she took it Thursday and Friday). On Saturday, she started having GI upset and reflux. Patient states she has been able to eat small meals and the GERD/ GI upset seems to be more under control today. Patient stopped medication on Friday. Patient would like to discuss other treatment options at her appointment on 12/12/2019. Patient states she is dealing with her husbands health right now, so she would prefer to wait until her January appointment to start anything else.

## 2019-12-11 DIAGNOSIS — Z8742 Personal history of other diseases of the female genital tract: Secondary | ICD-10-CM | POA: Diagnosis not present

## 2019-12-11 DIAGNOSIS — Z01419 Encounter for gynecological examination (general) (routine) without abnormal findings: Secondary | ICD-10-CM | POA: Diagnosis not present

## 2019-12-12 ENCOUNTER — Ambulatory Visit: Payer: Medicare Other | Admitting: Rheumatology

## 2019-12-13 ENCOUNTER — Encounter (HOSPITAL_COMMUNITY)
Admission: RE | Admit: 2019-12-13 | Discharge: 2019-12-13 | Disposition: A | Discharge status: Home / Self Care | Payer: Medicare Other | Source: Ambulatory Visit | Attending: Rheumatology | Admitting: Rheumatology

## 2019-12-13 ENCOUNTER — Other Ambulatory Visit: Payer: Self-pay

## 2019-12-13 ENCOUNTER — Encounter (HOSPITAL_COMMUNITY): Payer: Self-pay

## 2019-12-13 DIAGNOSIS — M069 Rheumatoid arthritis, unspecified: Secondary | ICD-10-CM | POA: Diagnosis not present

## 2019-12-13 LAB — COMPREHENSIVE METABOLIC PANEL
ALT: 29 U/L (ref 0–44)
AST: 29 U/L (ref 15–41)
Albumin: 4.1 g/dL (ref 3.5–5.0)
Alkaline Phosphatase: 104 U/L (ref 38–126)
Anion gap: 9 (ref 5–15)
BUN: 29 mg/dL — ABNORMAL HIGH (ref 8–23)
CO2: 28 mmol/L (ref 22–32)
Calcium: 9.7 mg/dL (ref 8.9–10.3)
Chloride: 103 mmol/L (ref 98–111)
Creatinine, Ser: 0.8 mg/dL (ref 0.44–1.00)
GFR calc Af Amer: >60 mL/min (ref >60)
GFR calc non Af Amer: >60 mL/min (ref >60)
Glucose, Bld: 89 mg/dL (ref 70–99)
Potassium: 4.2 mmol/L (ref 3.5–5.1)
Sodium: 140 mmol/L (ref 135–145)
Total Bilirubin: 0.7 mg/dL (ref 0.3–1.2)
Total Protein: 6.7 g/dL (ref 6.5–8.1)

## 2019-12-13 LAB — CBC
HCT: 41.5 % (ref 36.0–46.0)
Hemoglobin: 13.2 g/dL (ref 12.0–15.0)
MCH: 28.2 pg (ref 26.0–34.0)
MCHC: 31.8 g/dL (ref 30.0–36.0)
MCV: 88.7 fL (ref 80.0–100.0)
Platelets: 179 10*3/uL (ref 150–400)
RBC: 4.68 MIL/uL (ref 3.87–5.11)
RDW: 13.7 % (ref 11.5–15.5)
WBC: 6.3 10*3/uL (ref 4.0–10.5)
nRBC: 0 % (ref 0.0–0.2)

## 2019-12-13 MED ORDER — SODIUM CHLORIDE 0.9 % IV SOLN: Freq: Once | INTRAVENOUS | Status: AC

## 2019-12-13 MED ORDER — SODIUM CHLORIDE 0.9 % IV SOLN
750.0000 mg | Freq: Once | INTRAVENOUS | Status: AC
  Administered 2019-12-13: 750 mg via INTRAVENOUS
  Filled 2019-12-13: qty 30

## 2019-12-13 NOTE — Progress Notes (Signed)
CBC and CMP are within normal limits.

## 2019-12-22 DIAGNOSIS — I10 Essential (primary) hypertension: Secondary | ICD-10-CM | POA: Diagnosis not present

## 2019-12-22 DIAGNOSIS — Z6829 Body mass index (BMI) 29.0-29.9, adult: Secondary | ICD-10-CM | POA: Diagnosis not present

## 2019-12-22 DIAGNOSIS — J449 Chronic obstructive pulmonary disease, unspecified: Secondary | ICD-10-CM | POA: Diagnosis not present

## 2019-12-22 DIAGNOSIS — E7800 Pure hypercholesterolemia, unspecified: Secondary | ICD-10-CM | POA: Diagnosis not present

## 2019-12-22 DIAGNOSIS — F329 Major depressive disorder, single episode, unspecified: Secondary | ICD-10-CM | POA: Diagnosis not present

## 2019-12-22 DIAGNOSIS — Z87891 Personal history of nicotine dependence: Secondary | ICD-10-CM | POA: Diagnosis not present

## 2019-12-22 DIAGNOSIS — Z299 Encounter for prophylactic measures, unspecified: Secondary | ICD-10-CM | POA: Diagnosis not present

## 2019-12-27 ENCOUNTER — Telehealth: Payer: Self-pay | Admitting: Rheumatology

## 2019-12-27 NOTE — Telephone Encounter (Signed)
Patient called to get Dr. Arlean Hopping recommendations on the COVID vaccine.  Patient requested a return call to let her know if she should sign up.

## 2019-12-27 NOTE — Telephone Encounter (Signed)
Patient advised per Dr. Estanislado Pandy, she recommends getting the COVID vaccine. Patient advised it is not a live vaccine therefore she will not need to hold her medication or delay her infusion. Patient advised she can schedule as soon as she is able. Patient verbalized understanding.

## 2020-01-04 ENCOUNTER — Other Ambulatory Visit: Payer: Self-pay | Admitting: *Deleted

## 2020-01-04 DIAGNOSIS — M0579 Rheumatoid arthritis with rheumatoid factor of multiple sites without organ or systems involvement: Secondary | ICD-10-CM

## 2020-01-10 ENCOUNTER — Other Ambulatory Visit: Payer: Self-pay

## 2020-01-10 ENCOUNTER — Encounter (HOSPITAL_COMMUNITY)
Admission: RE | Admit: 2020-01-10 | Discharge: 2020-01-10 | Disposition: A | Discharge status: Home / Self Care | Payer: Medicare Other | Source: Ambulatory Visit | Attending: Rheumatology | Admitting: Rheumatology

## 2020-01-10 DIAGNOSIS — M069 Rheumatoid arthritis, unspecified: Secondary | ICD-10-CM | POA: Diagnosis not present

## 2020-01-10 DIAGNOSIS — M0579 Rheumatoid arthritis with rheumatoid factor of multiple sites without organ or systems involvement: Secondary | ICD-10-CM

## 2020-01-10 MED ORDER — SODIUM CHLORIDE 0.9 % IV SOLN
750.0000 mg | INTRAVENOUS | Status: DC
  Administered 2020-01-10: 09:00:00 750 mg via INTRAVENOUS
  Filled 2020-01-10: qty 30

## 2020-01-10 MED ORDER — SODIUM CHLORIDE 0.9 % IV SOLN: INTRAVENOUS | Status: DC

## 2020-01-10 NOTE — Progress Notes (Signed)
Pt states she took her tylenol and benadryl at 0730 this AM at home.

## 2020-01-16 ENCOUNTER — Other Ambulatory Visit: Payer: Self-pay | Admitting: *Deleted

## 2020-01-16 DIAGNOSIS — M0579 Rheumatoid arthritis with rheumatoid factor of multiple sites without organ or systems involvement: Secondary | ICD-10-CM

## 2020-01-16 NOTE — Progress Notes (Deleted)
Office Visit Note  Patient: Tiffany Pollard             Date of Birth: 1951-04-22           MRN: [id number]             PCP: Monico Blitz, MD Referring: Monico Blitz, MD Visit Date: 01/23/2020 Occupation: @GUAROCC @  Subjective:  No chief complaint on file.   History of Present Illness: Tiffany Pollard is a 69 y.o. female ***   Activities of Daily Living:  Patient reports morning stiffness for *** {minute/hour:19697}.   Patient {ACTIONS;DENIES/REPORTS:21021675::"Denies"} nocturnal pain.  Difficulty dressing/grooming: {ACTIONS;DENIES/REPORTS:21021675::"Denies"} Difficulty climbing stairs: {ACTIONS;DENIES/REPORTS:21021675::"Denies"} Difficulty getting out of chair: {ACTIONS;DENIES/REPORTS:21021675::"Denies"} Difficulty using hands for taps, buttons, cutlery, and/or writing: {ACTIONS;DENIES/REPORTS:21021675::"Denies"}  No Rheumatology ROS completed.   PMFS History:  Patient Active Problem List   Diagnosis Date Noted  . Primary osteoarthritis of both knees 04/30/2017  . History of anemia 04/30/2017  . History of migraine 04/30/2017  . History of vertigo 04/30/2017  . History of gastroesophageal reflux (GERD) 04/30/2017  . History of kidney stones 04/30/2017  . History of IBS 04/30/2017  . History of hyperlipidemia 04/30/2017  . Hypercalcemia 04/30/2017  . Rheumatoid arthritis involving multiple sites with positive rheumatoid factor ([place]) 12/08/2016  . High risk medication use 12/08/2016  . Primary osteoarthritis of both hands 12/08/2016  . Primary osteoarthritis of both feet 12/08/2016  . Osteoarthritis of lumbar spine 12/08/2016  . Former smoker 12/08/2016  . Abdominal pain, chronic, right lower quadrant 12/11/2013    Past Medical History:  Diagnosis Date  . GERD (gastroesophageal reflux disease)   . High blood pressure   . Rheumatoid arthritis(714.0)     No family history on file. Past Surgical History:  Procedure Laterality Date  . BACK SURGERY    . CERVICAL  CONIZATION W/BX    . COLONOSCOPY N/A 12/27/2013   Procedure: COLONOSCOPY;  Surgeon: Rogene Houston, MD;  Location: [facility];  Service: Endoscopy;  Laterality: N/A;  320-moved to 125 Ann to notify pt  . FINGER FRACTURE SURGERY    . foot surgery for a bunion    . NASAL SINUS SURGERY    . TONSILLECTOMY     Social History   Social History Narrative  . Not on file   Immunization History  Administered Date(s) Administered  . Influenza,inj,Quad PF,6+ Mos 08/18/2017  . Pneumococcal Conjugate-13 07/13/2018     Objective: Vital Signs: There were no vitals taken for this visit.   Physical Exam   Musculoskeletal Exam: ***  CDAI Exam: CDAI Score: -- Patient Global: --; Provider Global: -- Swollen: --; Tender: -- Joint Exam 01/23/2020   No joint exam has been documented for this visit   There is currently no information documented on the homunculus. Go to the Rheumatology activity and complete the homunculus joint exam.  Investigation: No additional findings.  Imaging: No results found.  Recent Labs: Lab Results  Component Value Date   WBC 6.3 12/13/2019   HGB 13.2 12/13/2019   PLT 179 12/13/2019   NA 140 12/13/2019   K 4.2 12/13/2019   CL 103 12/13/2019   CO2 28 12/13/2019   GLUCOSE 89 12/13/2019   BUN 29 (H) 12/13/2019   CREATININE 0.80 12/13/2019   BILITOT 0.7 12/13/2019   ALKPHOS 104 12/13/2019   AST 29 12/13/2019   ALT 29 12/13/2019   PROT 6.7 12/13/2019   ALBUMIN 4.1 12/13/2019   CALCIUM 9.7 12/13/2019   GFRAA >60 12/13/2019  QFTBGOLD Negative 03/11/2017   QFTBGOLDPLUS Negative 02/15/2019    Speciality Comments: No specialty comments available.  Procedures:  No procedures performed Allergies: Tetanus toxoid   Assessment / Plan:     Visit Diagnoses: No diagnosis found.  Orders: No orders of the defined types were placed in this encounter.  No orders of the defined types were placed in this encounter.   Face-to-face time spent with patient  was *** minutes. Greater than 50% of time was spent in counseling and coordination of care.  Follow-Up Instructions: No follow-ups on file.   Earnestine Mealing, CMA  Note - This record has been created using Editor, commissioning.  Chart creation errors have been sought, but may not always  have been located. Such creation errors do not reflect on  the standard of medical care.

## 2020-01-23 ENCOUNTER — Ambulatory Visit: Payer: Medicare Other | Admitting: Physician Assistant

## 2020-02-07 ENCOUNTER — Encounter (HOSPITAL_COMMUNITY): Payer: Medicare Other

## 2020-02-08 ENCOUNTER — Encounter (HOSPITAL_COMMUNITY)
Admission: RE | Admit: 2020-02-08 | Discharge: 2020-02-08 | Disposition: A | Discharge status: Home / Self Care | Payer: Medicare Other | Source: Ambulatory Visit | Attending: Rheumatology | Admitting: Rheumatology

## 2020-02-08 ENCOUNTER — Other Ambulatory Visit: Payer: Self-pay

## 2020-02-08 ENCOUNTER — Encounter (HOSPITAL_COMMUNITY): Payer: Self-pay

## 2020-02-08 DIAGNOSIS — M0579 Rheumatoid arthritis with rheumatoid factor of multiple sites without organ or systems involvement: Secondary | ICD-10-CM

## 2020-02-08 DIAGNOSIS — M069 Rheumatoid arthritis, unspecified: Secondary | ICD-10-CM | POA: Diagnosis not present

## 2020-02-08 LAB — COMPREHENSIVE METABOLIC PANEL
ALT: 21 U/L (ref 0–44)
AST: 24 U/L (ref 15–41)
Albumin: 4.1 g/dL (ref 3.5–5.0)
Alkaline Phosphatase: 105 U/L (ref 38–126)
Anion gap: 9 (ref 5–15)
BUN: 30 mg/dL — ABNORMAL HIGH (ref 8–23)
CO2: 27 mmol/L (ref 22–32)
Calcium: 9.3 mg/dL (ref 8.9–10.3)
Chloride: 103 mmol/L (ref 98–111)
Creatinine, Ser: 0.79 mg/dL (ref 0.44–1.00)
GFR calc Af Amer: >60 mL/min (ref >60)
GFR calc non Af Amer: >60 mL/min (ref >60)
Glucose, Bld: 116 mg/dL — ABNORMAL HIGH (ref 70–99)
Potassium: 3.8 mmol/L (ref 3.5–5.1)
Sodium: 139 mmol/L (ref 135–145)
Total Bilirubin: 0.4 mg/dL (ref 0.3–1.2)
Total Protein: 6.5 g/dL (ref 6.5–8.1)

## 2020-02-08 LAB — CBC
HCT: 42.2 % (ref 36.0–46.0)
Hemoglobin: 13.4 g/dL (ref 12.0–15.0)
MCH: 28 pg (ref 26.0–34.0)
MCHC: 31.8 g/dL (ref 30.0–36.0)
MCV: 88.3 fL (ref 80.0–100.0)
Platelets: 197 10*3/uL (ref 150–400)
RBC: 4.78 MIL/uL (ref 3.87–5.11)
RDW: 13.4 % (ref 11.5–15.5)
WBC: 7.4 10*3/uL (ref 4.0–10.5)
nRBC: 0 % (ref 0.0–0.2)

## 2020-02-08 MED ORDER — DIPHENHYDRAMINE HCL 25 MG PO CAPS: 25.0000 mg | ORAL_CAPSULE | ORAL | Status: DC

## 2020-02-08 MED ORDER — SODIUM CHLORIDE 0.9 % IV SOLN: Freq: Once | INTRAVENOUS | Status: AC

## 2020-02-08 MED ORDER — ACETAMINOPHEN 325 MG PO TABS: 650.0000 mg | ORAL_TABLET | ORAL | Status: DC

## 2020-02-08 MED ORDER — SODIUM CHLORIDE 0.9 % IV SOLN
750.0000 mg | INTRAVENOUS | Status: DC
  Administered 2020-02-08: 750 mg via INTRAVENOUS
  Filled 2020-02-08: qty 30

## 2020-02-08 NOTE — Progress Notes (Signed)
CBC is normal.  CMP shows mildly elevated glucose.  Probably nonfasting blood sample.

## 2020-02-12 LAB — QUANTIFERON-TB GOLD PLUS

## 2020-02-12 NOTE — Progress Notes (Deleted)
Office Visit Note  Patient: Tiffany Pollard             Date of Birth: 15-Jun-1951           MRN: [id number]             PCP: Monico Blitz, MD Referring: Monico Blitz, MD Visit Date: 02/20/2020 Occupation: @GUAROCC @  Subjective:  No chief complaint on file.   History of Present Illness: Tiffany Pollard is a 69 y.o. female ***   Activities of Daily Living:  Patient reports morning stiffness for *** {minute/hour:19697}.   Patient {ACTIONS;DENIES/REPORTS:21021675::"Denies"} nocturnal pain.  Difficulty dressing/grooming: {ACTIONS;DENIES/REPORTS:21021675::"Denies"} Difficulty climbing stairs: {ACTIONS;DENIES/REPORTS:21021675::"Denies"} Difficulty getting out of chair: {ACTIONS;DENIES/REPORTS:21021675::"Denies"} Difficulty using hands for taps, buttons, cutlery, and/or writing: {ACTIONS;DENIES/REPORTS:21021675::"Denies"}  No Rheumatology ROS completed.   PMFS History:  Patient Active Problem List   Diagnosis Date Noted  . Primary osteoarthritis of both knees 04/30/2017  . History of anemia 04/30/2017  . History of migraine 04/30/2017  . History of vertigo 04/30/2017  . History of gastroesophageal reflux (GERD) 04/30/2017  . History of kidney stones 04/30/2017  . History of IBS 04/30/2017  . History of hyperlipidemia 04/30/2017  . Hypercalcemia 04/30/2017  . Rheumatoid arthritis involving multiple sites with positive rheumatoid factor ([place]) 12/08/2016  . High risk medication use 12/08/2016  . Primary osteoarthritis of both hands 12/08/2016  . Primary osteoarthritis of both feet 12/08/2016  . Osteoarthritis of lumbar spine 12/08/2016  . Former smoker 12/08/2016  . Abdominal pain, chronic, right lower quadrant 12/11/2013    Past Medical History:  Diagnosis Date  . GERD (gastroesophageal reflux disease)   . High blood pressure   . Rheumatoid arthritis(714.0)     No family history on file. Past Surgical History:  Procedure Laterality Date  . BACK SURGERY    . CERVICAL  CONIZATION W/BX    . COLONOSCOPY N/A 12/27/2013   Procedure: COLONOSCOPY;  Surgeon: Rogene Houston, MD;  Location: [facility];  Service: Endoscopy;  Laterality: N/A;  320-moved to 125 Ann to notify pt  . FINGER FRACTURE SURGERY    . foot surgery for a bunion    . NASAL SINUS SURGERY    . TONSILLECTOMY     Social History   Social History Narrative  . Not on file   Immunization History  Administered Date(s) Administered  . Influenza,inj,Quad PF,6+ Mos 08/18/2017  . Pneumococcal Conjugate-13 07/13/2018     Objective: Vital Signs: There were no vitals taken for this visit.   Physical Exam   Musculoskeletal Exam: ***  CDAI Exam: CDAI Score: -- Patient Global: --; Provider Global: -- Swollen: --; Tender: -- Joint Exam 02/20/2020   No joint exam has been documented for this visit   There is currently no information documented on the homunculus. Go to the Rheumatology activity and complete the homunculus joint exam.  Investigation: No additional findings.  Imaging: No results found.  Recent Labs: Lab Results  Component Value Date   WBC 7.4 02/08/2020   HGB 13.4 02/08/2020   PLT 197 02/08/2020   NA 139 02/08/2020   K 3.8 02/08/2020   CL 103 02/08/2020   CO2 27 02/08/2020   GLUCOSE 116 (H) 02/08/2020   BUN 30 (H) 02/08/2020   CREATININE 0.79 02/08/2020   BILITOT 0.4 02/08/2020   ALKPHOS 105 02/08/2020   AST 24 02/08/2020   ALT 21 02/08/2020   PROT 6.5 02/08/2020   ALBUMIN 4.1 02/08/2020   CALCIUM 9.3 02/08/2020   GFRAA >60 02/08/2020  QFTBGOLD Negative 03/11/2017   QFTBGOLDPLUS Negative 02/15/2019    Speciality Comments: No specialty comments available.  Procedures:  No procedures performed Allergies: Tetanus toxoid   Assessment / Plan:     Visit Diagnoses: No diagnosis found.  Orders: No orders of the defined types were placed in this encounter.  No orders of the defined types were placed in this encounter.   Face-to-face time spent with  patient was *** minutes. Greater than 50% of time was spent in counseling and coordination of care.  Follow-Up Instructions: No follow-ups on file.   Earnestine Mealing, CMA  Note - This record has been created using Editor, commissioning.  Chart creation errors have been sought, but may not always  have been located. Such creation errors do not reflect on  the standard of medical care.

## 2020-02-13 NOTE — Progress Notes (Signed)
TB Gold was not performed.  Please find out the reason.

## 2020-02-20 ENCOUNTER — Ambulatory Visit: Payer: Medicare Other | Admitting: Physician Assistant

## 2020-02-20 NOTE — Progress Notes (Signed)
Office Visit Note  Patient: Tiffany Pollard             Date of Birth: 22-May-1951           MRN: [id number]             PCP: Monico Blitz, MD Referring: Monico Blitz, MD Visit Date: 02/23/2020 Occupation: @GUAROCC @  Subjective:  Rheumatoid Arthritis (Not doing good, right elbow pain)   History of Present Illness: Tiffany Pollard is a 69 y.o. female with history of seropositive rheumatoid arthritis.  She has been on Orencia infusions.  She could not tolerate a lot of medications including Plaquenil and is currently on monotherapy.  She states she continues to have a lot of pain and discomfort due to arthritis.  She has not noticed any joint swelling.  She has been having some discomfort in her right elbow for the last few 3 months.  She is not sure if it was an injury or due to arthritis.  She has some discomfort in her hands due to underlying osteoarthritis.  She is also going through a lot of stress because of her husband's illness.  Activities of Daily Living:  Patient reports morning stiffness for 1.5 hours.   Patient Reports nocturnal pain.  Difficulty dressing/grooming: Denies Difficulty climbing stairs: Denies Difficulty getting out of chair: Reports Difficulty using hands for taps, buttons, cutlery, and/or writing: Reports  Review of Systems  Constitutional: Positive for fatigue. Negative for night sweats, weight gain and weight loss.  HENT: Positive for mouth dryness. Negative for mouth sores, trouble swallowing, trouble swallowing and nose dryness.   Eyes: Positive for dryness. Negative for pain, redness and visual disturbance.  Respiratory: Positive for shortness of breath. Negative for cough and difficulty breathing.        History of asthma and allergies  Cardiovascular: Negative for chest pain, palpitations, hypertension, irregular heartbeat and swelling in legs/feet.  Gastrointestinal: Negative for blood in stool, constipation and diarrhea.  Endocrine: Negative for  excessive thirst and increased urination.  Genitourinary: Negative for difficulty urinating and vaginal dryness.  Musculoskeletal: Positive for arthralgias, joint pain, joint swelling and morning stiffness. Negative for myalgias, muscle weakness, muscle tenderness and myalgias.  Skin: Positive for rash. Negative for color change, hair loss, skin tightness, ulcers and sensitivity to sunlight.  Allergic/Immunologic: Positive for susceptible to infections.  Neurological: Negative for dizziness, numbness, memory loss, night sweats and weakness.  Hematological: Negative for bruising/bleeding tendency and swollen glands.  Psychiatric/Behavioral: Positive for sleep disturbance. Negative for depressed mood. The patient is not nervous/anxious.     PMFS History:  Patient Active Problem List   Diagnosis Date Noted  . Primary osteoarthritis of both knees 04/30/2017  . History of anemia 04/30/2017  . History of migraine 04/30/2017  . History of vertigo 04/30/2017  . History of gastroesophageal reflux (GERD) 04/30/2017  . History of kidney stones 04/30/2017  . History of IBS 04/30/2017  . History of hyperlipidemia 04/30/2017  . Hypercalcemia 04/30/2017  . Rheumatoid arthritis involving multiple sites with positive rheumatoid factor ([place]) 12/08/2016  . High risk medication use 12/08/2016  . Primary osteoarthritis of both hands 12/08/2016  . Primary osteoarthritis of both feet 12/08/2016  . Osteoarthritis of lumbar spine 12/08/2016  . Former smoker 12/08/2016  . Abdominal pain, chronic, right lower quadrant 12/11/2013    Past Medical History:  Diagnosis Date  . GERD (gastroesophageal reflux disease)   . High blood pressure   . Rheumatoid arthritis(714.0)     History  reviewed. No pertinent family history. Past Surgical History:  Procedure Laterality Date  . BACK SURGERY    . CERVICAL CONIZATION W/BX    . COLONOSCOPY N/A 12/27/2013   Procedure: COLONOSCOPY;  Surgeon: Rogene Houston, MD;   Location: [facility];  Service: Endoscopy;  Laterality: N/A;  320-moved to 125 Ann to notify pt  . FINGER FRACTURE SURGERY    . foot surgery for a bunion    . NASAL SINUS SURGERY    . TONSILLECTOMY     Social History   Social History Narrative  . Not on file   Immunization History  Administered Date(s) Administered  . Influenza,inj,Quad PF,6+ Mos 08/18/2017  . Pneumococcal Conjugate-13 07/13/2018     Objective: Vital Signs: BP 128/71 (BP Location: Left Arm, Patient Position: Sitting, Cuff Size: Normal)   Pulse 81   Resp 16   Ht 5\' 5"  (1.651 m)   Wt 169 lb (76.7 kg)   BMI 28.12 kg/m    Physical Exam Vitals and nursing note reviewed.  Constitutional:      Appearance: She is well-developed.  HENT:     Head: Normocephalic and atraumatic.  Eyes:     Conjunctiva/sclera: Conjunctivae normal.  Cardiovascular:     Rate and Rhythm: Normal rate and regular rhythm.     Heart sounds: Normal heart sounds.  Pulmonary:     Effort: Pulmonary effort is normal.     Breath sounds: Normal breath sounds.  Abdominal:     General: Bowel sounds are normal.     Palpations: Abdomen is soft.  Musculoskeletal:     Cervical back: Normal range of motion.  Lymphadenopathy:     Cervical: No cervical adenopathy.  Skin:    General: Skin is warm and dry.     Capillary Refill: Capillary refill takes less than 2 seconds.  Neurological:     Mental Status: She is alert and oriented to person, place, and time.  Psychiatric:        Behavior: Behavior normal.      Musculoskeletal Exam: C-spine was in good range of motion.  Shoulder joints, elbow joints, wrist joints with good range of motion.  She has no synovitis over MCP joints.  PIP and DIP thickening was noted.  She has some tenderness over right elbow joints with no synovitis.  Hip joints, knee joints, ankles with good range of motion with no synovitis.  CDAI Exam: CDAI Score: 1.1  Patient Global: 8 mm; Provider Global: 3 mm Swollen: 0 ;  Tender: 0  Joint Exam 02/23/2020   No joint exam has been documented for this visit   There is currently no information documented on the homunculus. Go to the Rheumatology activity and complete the homunculus joint exam.  Investigation: No additional findings.  Imaging: No results found.  Recent Labs: Lab Results  Component Value Date   WBC 7.4 02/08/2020   HGB 13.4 02/08/2020   PLT 197 02/08/2020   NA 139 02/08/2020   K 3.8 02/08/2020   CL 103 02/08/2020   CO2 27 02/08/2020   GLUCOSE 116 (H) 02/08/2020   BUN 30 (H) 02/08/2020   CREATININE 0.79 02/08/2020   BILITOT 0.4 02/08/2020   ALKPHOS 105 02/08/2020   AST 24 02/08/2020   ALT 21 02/08/2020   PROT 6.5 02/08/2020   ALBUMIN 4.1 02/08/2020   CALCIUM 9.3 02/08/2020   GFRAA >60 02/08/2020   QFTBGOLD Negative 03/11/2017   QFTBGOLDPLUS NOT PERFORMED 02/08/2020    Speciality Comments: No specialty comments  available.  Procedures:  No procedures performed Allergies: Tetanus toxoid   Assessment / Plan:     Visit Diagnoses: Rheumatoid arthritis involving multiple sites with positive rheumatoid factor (HCC) - +RF +CCP Ab, -ANA.  Patient states she has been having increased pain and discomfort on Orencia monotherapy.  She could not tolerate Plaquenil due to GI side effects.  She has taken methotrexate in the past which caused fatigue.  She is willing to try methotrexate again if needed.  She has been under a lot of stress as her husband has been diagnosed with a stage IV lung cancer.  He has been through chemotherapy and recently had sepsis.  I did not see any synovitis on my examination.  High risk medication use - Orencia 750 mg IV infusions every 28 days (last infusion was on 10/17/19).d/c Simponi Aria IV due to recurrent infections, d/c Enbrel due to lack of insurance, Plaquenil caused reflux, methotrexate caused fatigue-her labs were normal in March.  She will get labs with infusions.  We will check TB Gold today.  Plan:  QuantiFERON-TB Gold Plus  Primary osteoarthritis of both hands-causes chronic discomfort.  Primary osteoarthritis of both knees-she continues to have some pain in her knees.  Primary osteoarthritis of both feet-doing well  Spondylosis of lumbar region without myelopathy or radiculopathy-she has some discomfort off and on.  Age-related osteoporosis without current pathological fracture - DEXA on 01/05/17 ordered by PCP: [facility] BMD 0.869 T-score -2.8. Fosamax discontinued due to experiencing reflux.  She will schedule with her PCP.  Other medical problems are listed as follows:  History of migraine  History of vertigo  History of gastroesophageal reflux (GERD)  History of anemia  History of IBS  History of kidney stones  History of hyperlipidemia  Former smoker  Orders: Orders Placed This Encounter  Procedures  . QuantiFERON-TB Gold Plus   No orders of the defined types were placed in this encounter.   Face-to-face time spent with patient was 30 minutes. Greater than 50% of time was spent in counseling and coordination of care.  Follow-Up Instructions: Return in about 5 months (around 07/25/2020) for Rheumatoid arthritis.   Bo Merino, MD  Note - This record has been created using Editor, commissioning.  Chart creation errors have been sought, but may not always  have been located. Such creation errors do not reflect on  the standard of medical care.

## 2020-02-23 ENCOUNTER — Encounter: Payer: Self-pay | Admitting: Physician Assistant

## 2020-02-23 ENCOUNTER — Other Ambulatory Visit: Payer: Self-pay

## 2020-02-23 ENCOUNTER — Ambulatory Visit (INDEPENDENT_AMBULATORY_CARE_PROVIDER_SITE_OTHER): Payer: Medicare Other | Admitting: Rheumatology

## 2020-02-23 VITALS — BP 128/71 | HR 81 | Resp 16 | Ht 65.0 in | Wt 169.0 lb

## 2020-02-23 DIAGNOSIS — Z87442 Personal history of urinary calculi: Secondary | ICD-10-CM

## 2020-02-23 DIAGNOSIS — Z862 Personal history of diseases of the blood and blood-forming organs and certain disorders involving the immune mechanism: Secondary | ICD-10-CM | POA: Diagnosis not present

## 2020-02-23 DIAGNOSIS — Z87898 Personal history of other specified conditions: Secondary | ICD-10-CM

## 2020-02-23 DIAGNOSIS — Z79899 Other long term (current) drug therapy: Secondary | ICD-10-CM | POA: Diagnosis not present

## 2020-02-23 DIAGNOSIS — M19041 Primary osteoarthritis, right hand: Secondary | ICD-10-CM | POA: Diagnosis not present

## 2020-02-23 DIAGNOSIS — M170 Bilateral primary osteoarthritis of knee: Secondary | ICD-10-CM

## 2020-02-23 DIAGNOSIS — M47816 Spondylosis without myelopathy or radiculopathy, lumbar region: Secondary | ICD-10-CM

## 2020-02-23 DIAGNOSIS — M19071 Primary osteoarthritis, right ankle and foot: Secondary | ICD-10-CM

## 2020-02-23 DIAGNOSIS — M810 Age-related osteoporosis without current pathological fracture: Secondary | ICD-10-CM

## 2020-02-23 DIAGNOSIS — Z8719 Personal history of other diseases of the digestive system: Secondary | ICD-10-CM

## 2020-02-23 DIAGNOSIS — M19072 Primary osteoarthritis, left ankle and foot: Secondary | ICD-10-CM

## 2020-02-23 DIAGNOSIS — Z8669 Personal history of other diseases of the nervous system and sense organs: Secondary | ICD-10-CM | POA: Diagnosis not present

## 2020-02-23 DIAGNOSIS — M0579 Rheumatoid arthritis with rheumatoid factor of multiple sites without organ or systems involvement: Secondary | ICD-10-CM | POA: Diagnosis not present

## 2020-02-23 DIAGNOSIS — Z8639 Personal history of other endocrine, nutritional and metabolic disease: Secondary | ICD-10-CM

## 2020-02-23 DIAGNOSIS — M19042 Primary osteoarthritis, left hand: Secondary | ICD-10-CM

## 2020-02-23 DIAGNOSIS — Z87891 Personal history of nicotine dependence: Secondary | ICD-10-CM

## 2020-02-26 DIAGNOSIS — Z6827 Body mass index (BMI) 27.0-27.9, adult: Secondary | ICD-10-CM | POA: Diagnosis not present

## 2020-02-26 DIAGNOSIS — I10 Essential (primary) hypertension: Secondary | ICD-10-CM | POA: Diagnosis not present

## 2020-02-26 DIAGNOSIS — R911 Solitary pulmonary nodule: Secondary | ICD-10-CM | POA: Diagnosis not present

## 2020-02-26 DIAGNOSIS — Z299 Encounter for prophylactic measures, unspecified: Secondary | ICD-10-CM | POA: Diagnosis not present

## 2020-02-26 DIAGNOSIS — Z87891 Personal history of nicotine dependence: Secondary | ICD-10-CM | POA: Diagnosis not present

## 2020-02-26 DIAGNOSIS — B079 Viral wart, unspecified: Secondary | ICD-10-CM | POA: Diagnosis not present

## 2020-02-28 DIAGNOSIS — I700 Atherosclerosis of aorta: Secondary | ICD-10-CM | POA: Diagnosis not present

## 2020-02-28 DIAGNOSIS — J439 Emphysema, unspecified: Secondary | ICD-10-CM | POA: Diagnosis not present

## 2020-02-28 DIAGNOSIS — R918 Other nonspecific abnormal finding of lung field: Secondary | ICD-10-CM | POA: Diagnosis not present

## 2020-02-29 LAB — QUANTIFERON-TB GOLD PLUS
Mitogen-NIL: >10 IU/mL
NIL: 0.05 IU/mL
QuantiFERON-TB Gold Plus: NEGATIVE
TB1-NIL: 0.01 IU/mL
TB2-NIL: 0.01 IU/mL

## 2020-03-07 ENCOUNTER — Encounter (HOSPITAL_COMMUNITY)
Admission: RE | Admit: 2020-03-07 | Discharge: 2020-03-07 | Disposition: A | Discharge status: Home / Self Care | Payer: Medicare Other | Source: Ambulatory Visit | Attending: Rheumatology | Admitting: Rheumatology

## 2020-03-07 ENCOUNTER — Other Ambulatory Visit: Payer: Self-pay

## 2020-03-07 ENCOUNTER — Encounter (HOSPITAL_COMMUNITY): Payer: Self-pay

## 2020-03-07 DIAGNOSIS — M069 Rheumatoid arthritis, unspecified: Secondary | ICD-10-CM | POA: Diagnosis not present

## 2020-03-07 MED ORDER — SODIUM CHLORIDE 0.9 % IV SOLN: Freq: Once | INTRAVENOUS | Status: AC

## 2020-03-07 MED ORDER — SODIUM CHLORIDE 0.9 % IV SOLN
750.0000 mg | Freq: Once | INTRAVENOUS | Status: AC
  Administered 2020-03-07: 750 mg via INTRAVENOUS
  Filled 2020-03-07: qty 30

## 2020-03-11 DIAGNOSIS — E2839 Other primary ovarian failure: Secondary | ICD-10-CM | POA: Diagnosis not present

## 2020-04-04 ENCOUNTER — Other Ambulatory Visit: Payer: Self-pay

## 2020-04-04 ENCOUNTER — Encounter (HOSPITAL_COMMUNITY): Payer: Self-pay

## 2020-04-04 ENCOUNTER — Encounter (HOSPITAL_COMMUNITY)
Admission: RE | Admit: 2020-04-04 | Discharge: 2020-04-04 | Disposition: A | Discharge status: Home / Self Care | Payer: Medicare Other | Source: Ambulatory Visit | Attending: Rheumatology | Admitting: Rheumatology

## 2020-04-04 DIAGNOSIS — M069 Rheumatoid arthritis, unspecified: Secondary | ICD-10-CM | POA: Diagnosis not present

## 2020-04-04 MED ORDER — SODIUM CHLORIDE 0.9 % IV SOLN
750.0000 mg | Freq: Once | INTRAVENOUS | Status: AC
  Administered 2020-04-04: 09:00:00 750 mg via INTRAVENOUS
  Filled 2020-04-04: qty 30

## 2020-04-04 MED ORDER — SODIUM CHLORIDE 0.9 % IV SOLN: Freq: Once | INTRAVENOUS | Status: AC

## 2020-04-10 DIAGNOSIS — J449 Chronic obstructive pulmonary disease, unspecified: Secondary | ICD-10-CM | POA: Diagnosis not present

## 2020-04-10 DIAGNOSIS — Z299 Encounter for prophylactic measures, unspecified: Secondary | ICD-10-CM | POA: Diagnosis not present

## 2020-04-10 DIAGNOSIS — J069 Acute upper respiratory infection, unspecified: Secondary | ICD-10-CM | POA: Diagnosis not present

## 2020-04-10 DIAGNOSIS — I10 Essential (primary) hypertension: Secondary | ICD-10-CM | POA: Diagnosis not present

## 2020-04-10 DIAGNOSIS — J309 Allergic rhinitis, unspecified: Secondary | ICD-10-CM | POA: Diagnosis not present

## 2020-04-12 ENCOUNTER — Other Ambulatory Visit: Payer: Self-pay | Admitting: *Deleted

## 2020-04-12 DIAGNOSIS — M0579 Rheumatoid arthritis with rheumatoid factor of multiple sites without organ or systems involvement: Secondary | ICD-10-CM

## 2020-04-12 NOTE — Progress Notes (Signed)
Infusion orders are current for patient CBC CMP Tylenol Benadryl appointments are up to date and follow up appointment  is scheduled TB gold not due yet, due on 02/07/2021.

## 2020-04-22 DIAGNOSIS — I10 Essential (primary) hypertension: Secondary | ICD-10-CM | POA: Diagnosis not present

## 2020-04-22 DIAGNOSIS — Z299 Encounter for prophylactic measures, unspecified: Secondary | ICD-10-CM | POA: Diagnosis not present

## 2020-04-22 DIAGNOSIS — B370 Candidal stomatitis: Secondary | ICD-10-CM | POA: Diagnosis not present

## 2020-04-30 ENCOUNTER — Other Ambulatory Visit (HOSPITAL_COMMUNITY): Payer: Self-pay | Admitting: Internal Medicine

## 2020-04-30 DIAGNOSIS — Z1231 Encounter for screening mammogram for malignant neoplasm of breast: Secondary | ICD-10-CM

## 2020-05-02 ENCOUNTER — Other Ambulatory Visit: Payer: Self-pay

## 2020-05-02 ENCOUNTER — Encounter (HOSPITAL_COMMUNITY)
Admission: RE | Admit: 2020-05-02 | Discharge: 2020-05-02 | Disposition: A | Discharge status: Home / Self Care | Payer: Medicare Other | Source: Ambulatory Visit | Attending: Rheumatology | Admitting: Rheumatology

## 2020-05-02 ENCOUNTER — Encounter (HOSPITAL_COMMUNITY): Payer: Self-pay

## 2020-05-02 DIAGNOSIS — M0579 Rheumatoid arthritis with rheumatoid factor of multiple sites without organ or systems involvement: Secondary | ICD-10-CM | POA: Insufficient documentation

## 2020-05-02 LAB — CBC
HCT: 39 % (ref 36.0–46.0)
Hemoglobin: 12.2 g/dL (ref 12.0–15.0)
MCH: 27.7 pg (ref 26.0–34.0)
MCHC: 31.3 g/dL (ref 30.0–36.0)
MCV: 88.6 fL (ref 80.0–100.0)
Platelets: 165 10*3/uL (ref 150–400)
RBC: 4.4 MIL/uL (ref 3.87–5.11)
RDW: 13.4 % (ref 11.5–15.5)
WBC: 6.1 10*3/uL (ref 4.0–10.5)
nRBC: 0 % (ref 0.0–0.2)

## 2020-05-02 LAB — COMPREHENSIVE METABOLIC PANEL
ALT: 25 U/L (ref 0–44)
AST: 27 U/L (ref 15–41)
Albumin: 3.6 g/dL (ref 3.5–5.0)
Alkaline Phosphatase: 82 U/L (ref 38–126)
Anion gap: 10 (ref 5–15)
BUN: 30 mg/dL — ABNORMAL HIGH (ref 8–23)
CO2: 24 mmol/L (ref 22–32)
Calcium: 9.1 mg/dL (ref 8.9–10.3)
Chloride: 106 mmol/L (ref 98–111)
Creatinine, Ser: 0.78 mg/dL (ref 0.44–1.00)
GFR calc Af Amer: >60 mL/min (ref >60)
GFR calc non Af Amer: >60 mL/min (ref >60)
Glucose, Bld: 98 mg/dL (ref 70–99)
Potassium: 4.2 mmol/L (ref 3.5–5.1)
Sodium: 140 mmol/L (ref 135–145)
Total Bilirubin: 0.5 mg/dL (ref 0.3–1.2)
Total Protein: 5.7 g/dL — ABNORMAL LOW (ref 6.5–8.1)

## 2020-05-02 MED ORDER — ACETAMINOPHEN 325 MG PO TABS: 650.0000 mg | ORAL_TABLET | ORAL | Status: DC

## 2020-05-02 MED ORDER — SODIUM CHLORIDE 0.9 % IV SOLN: Freq: Once | INTRAVENOUS | Status: AC

## 2020-05-02 MED ORDER — SODIUM CHLORIDE 0.9 % IV SOLN
750.0000 mg | INTRAVENOUS | Status: DC
  Administered 2020-05-02: 750 mg via INTRAVENOUS
  Filled 2020-05-02: qty 30

## 2020-05-02 MED ORDER — DIPHENHYDRAMINE HCL 25 MG PO CAPS: 25.0000 mg | ORAL_CAPSULE | ORAL | Status: DC

## 2020-05-30 ENCOUNTER — Other Ambulatory Visit: Payer: Self-pay

## 2020-05-30 ENCOUNTER — Encounter (HOSPITAL_COMMUNITY)
Admission: RE | Admit: 2020-05-30 | Discharge: 2020-05-30 | Disposition: A | Discharge status: Home / Self Care | Payer: Medicare Other | Source: Ambulatory Visit | Attending: Rheumatology | Admitting: Rheumatology

## 2020-05-30 DIAGNOSIS — M0579 Rheumatoid arthritis with rheumatoid factor of multiple sites without organ or systems involvement: Secondary | ICD-10-CM | POA: Diagnosis present

## 2020-05-30 MED ORDER — SODIUM CHLORIDE 0.9 % IV SOLN: Freq: Once | INTRAVENOUS | Status: AC

## 2020-05-30 MED ORDER — SODIUM CHLORIDE 0.9 % IV SOLN
750.0000 mg | Freq: Once | INTRAVENOUS | Status: AC
  Administered 2020-05-30: 750 mg via INTRAVENOUS
  Filled 2020-05-30: qty 30

## 2020-05-31 ENCOUNTER — Encounter (HOSPITAL_COMMUNITY): Payer: Medicare Other

## 2020-06-12 ENCOUNTER — Other Ambulatory Visit: Payer: Self-pay

## 2020-06-12 ENCOUNTER — Ambulatory Visit (HOSPITAL_COMMUNITY)
Admission: RE | Admit: 2020-06-12 | Discharge: 2020-06-12 | Disposition: A | Discharge status: Home / Self Care | Payer: Medicare Other | Source: Ambulatory Visit | Attending: Internal Medicine | Admitting: Internal Medicine

## 2020-06-12 DIAGNOSIS — Z1231 Encounter for screening mammogram for malignant neoplasm of breast: Secondary | ICD-10-CM | POA: Diagnosis not present

## 2020-06-27 ENCOUNTER — Encounter (HOSPITAL_COMMUNITY): Payer: Self-pay

## 2020-06-27 ENCOUNTER — Other Ambulatory Visit: Payer: Self-pay

## 2020-06-27 ENCOUNTER — Encounter (HOSPITAL_COMMUNITY)
Admission: RE | Admit: 2020-06-27 | Discharge: 2020-06-27 | Disposition: A | Discharge status: Home / Self Care | Payer: Medicare Other | Source: Ambulatory Visit | Attending: Rheumatology | Admitting: Rheumatology

## 2020-06-27 DIAGNOSIS — M0589 Other rheumatoid arthritis with rheumatoid factor of multiple sites: Secondary | ICD-10-CM | POA: Insufficient documentation

## 2020-06-27 LAB — CBC
HCT: 42 % (ref 36.0–46.0)
Hemoglobin: 13.2 g/dL (ref 12.0–15.0)
MCH: 28.3 pg (ref 26.0–34.0)
MCHC: 31.4 g/dL (ref 30.0–36.0)
MCV: 89.9 fL (ref 80.0–100.0)
Platelets: 178 10*3/uL (ref 150–400)
RBC: 4.67 MIL/uL (ref 3.87–5.11)
RDW: 13.8 % (ref 11.5–15.5)
WBC: 5.4 10*3/uL (ref 4.0–10.5)
nRBC: 0 % (ref 0.0–0.2)

## 2020-06-27 LAB — COMPREHENSIVE METABOLIC PANEL
ALT: 22 U/L (ref 0–44)
AST: 25 U/L (ref 15–41)
Albumin: 3.9 g/dL (ref 3.5–5.0)
Alkaline Phosphatase: 77 U/L (ref 38–126)
Anion gap: 9 (ref 5–15)
BUN: 27 mg/dL — ABNORMAL HIGH (ref 8–23)
CO2: 26 mmol/L (ref 22–32)
Calcium: 9.4 mg/dL (ref 8.9–10.3)
Chloride: 104 mmol/L (ref 98–111)
Creatinine, Ser: 0.74 mg/dL (ref 0.44–1.00)
GFR calc Af Amer: >60 mL/min (ref >60)
GFR calc non Af Amer: >60 mL/min (ref >60)
Glucose, Bld: 112 mg/dL — ABNORMAL HIGH (ref 70–99)
Potassium: 4.1 mmol/L (ref 3.5–5.1)
Sodium: 139 mmol/L (ref 135–145)
Total Bilirubin: 0.6 mg/dL (ref 0.3–1.2)
Total Protein: 6.6 g/dL (ref 6.5–8.1)

## 2020-06-27 MED ORDER — SODIUM CHLORIDE 0.9 % IV SOLN: Freq: Once | INTRAVENOUS | Status: AC

## 2020-06-27 MED ORDER — SODIUM CHLORIDE 0.9 % IV SOLN
750.0000 mg | Freq: Once | INTRAVENOUS | Status: AC
  Administered 2020-06-27: 750 mg via INTRAVENOUS
  Filled 2020-06-27: qty 30

## 2020-07-03 ENCOUNTER — Other Ambulatory Visit: Payer: Self-pay | Admitting: Pharmacist

## 2020-07-03 DIAGNOSIS — M0579 Rheumatoid arthritis with rheumatoid factor of multiple sites without organ or systems involvement: Secondary | ICD-10-CM

## 2020-07-03 NOTE — Progress Notes (Signed)
Patient is due for updated orders for Orencia infusion. Last CBC/CMP within normal limits on 06/27/2020. Last TB gold negative on 02/23/2020.  Orders placed for Orencia IV 750 mg every 28 days x 3 doses with premedication Tylenol and Benadryl. Standing orders placed for CBC/CMP every 2 months x 5. Patient due for TB Gold in March 2022 . Next infusion scheduled for 07/25/2020. Next office visit scheduled for 07/26/2020.  Mariella Saa, PharmD, [place], CPP Clinical Specialty Pharmacist (Rheumatology and Pulmonology)  07/03/2020 9:55 AM

## 2020-07-09 DIAGNOSIS — R410 Disorientation, unspecified: Secondary | ICD-10-CM | POA: Diagnosis not present

## 2020-07-09 DIAGNOSIS — R000 Tachycardia, unspecified: Secondary | ICD-10-CM | POA: Diagnosis not present

## 2020-07-12 NOTE — Progress Notes (Signed)
Office Visit Note  Patient: Tiffany Pollard             Date of Birth: 09-24-1951           MRN: [id number]             PCP: Monico Blitz, MD Referring: Monico Blitz, MD Visit Date: 07/26/2020 Occupation: @GUAROCC @  Subjective:  Other (right elbow pain, right knee , bilateral hand/wrist pain )   History of Present Illness: Tiffany Pollard is a 69 y.o. female with history of rheumatoid arthritis and osteoarthritis.  She states last week she was having some discomfort in her right elbow.  She also had some discomfort in her right knee and both hands and wrist.  She did not notice any swelling.  The symptoms lasted for 24 hours and then resolved.  She states she has been under a lot of stress due to her husband's illness.  Her husband has stage IV lung cancer and receiving chemotherapy.  She has not been able to sleep well.  She is also having lot of anxiety.  Activities of Daily Living:  Patient reports morning stiffness for 30-60  minutes.   Patient Reports nocturnal pain.  Difficulty dressing/grooming: Denies Difficulty climbing stairs: Denies Difficulty getting out of chair: Denies Difficulty using hands for taps, buttons, cutlery, and/or writing: Reports  Review of Systems  Constitutional: Positive for fatigue.  HENT: Positive for mouth dryness and nose dryness. Negative for mouth sores.   Eyes: Negative for itching and dryness.  Respiratory: Negative for wheezing and difficulty breathing.   Cardiovascular: Negative for chest pain and palpitations.  Gastrointestinal: Negative for blood in stool, constipation and diarrhea.  Endocrine: Negative for increased urination.  Genitourinary: Negative for difficulty urinating.  Musculoskeletal: Positive for arthralgias, joint pain, myalgias, morning stiffness, muscle tenderness and myalgias. Negative for joint swelling.  Skin: Negative for color change, rash and redness.  Allergic/Immunologic: Negative for susceptible to infections.    Neurological: Negative for dizziness, numbness, headaches, memory loss and weakness.  Hematological: Positive for bruising/bleeding tendency.  Psychiatric/Behavioral: Positive for sleep disturbance. Negative for confusion. The patient is nervous/anxious.     PMFS History:  Patient Active Problem List   Diagnosis Date Noted   Primary osteoarthritis of both knees 04/30/2017   History of anemia 04/30/2017   History of migraine 04/30/2017   History of vertigo 04/30/2017   History of gastroesophageal reflux (GERD) 04/30/2017   History of kidney stones 04/30/2017   History of IBS 04/30/2017   History of hyperlipidemia 04/30/2017   Hypercalcemia 04/30/2017   Rheumatoid arthritis involving multiple sites with positive rheumatoid factor (Tiffany Pollard) 12/08/2016   High risk medication use 12/08/2016   Primary osteoarthritis of both hands 12/08/2016   Primary osteoarthritis of both feet 12/08/2016   Osteoarthritis of lumbar spine 12/08/2016   Former smoker 12/08/2016   Abdominal pain, chronic, right lower quadrant 12/11/2013    Past Medical History:  Diagnosis Date   GERD (gastroesophageal reflux disease)    High blood pressure    Rheumatoid arthritis(714.0)     History reviewed. No pertinent family history. Past Surgical History:  Procedure Laterality Date   BACK SURGERY     CERVICAL CONIZATION W/BX     COLONOSCOPY N/A 12/27/2013   Procedure: COLONOSCOPY;  Surgeon: Rogene Houston, MD;  Location: [facility];  Service: Endoscopy;  Laterality: N/A;  320-moved to 125 Ann to notify pt   FINGER FRACTURE SURGERY     foot surgery for a bunion  NASAL SINUS SURGERY     TONSILLECTOMY     Social History   Social History Narrative   Not on file   Immunization History  Administered Date(s) Administered   Influenza,inj,Quad PF,6+ Mos 08/18/2017   PFIZER SARS-COV-2 Vaccination 01/12/2020, 02/02/2020   Pneumococcal Conjugate-13 07/13/2018      Objective: Vital Signs: BP 117/70 (BP Location: Left Arm, Patient Position: Sitting, Cuff Size: Normal)    Pulse 83    Resp 14    Ht 5\' 5"  (1.651 m)    Wt 169 lb 6.4 oz (76.8 kg)    BMI 28.19 kg/m    Physical Exam Vitals and nursing note reviewed.  Constitutional:      Appearance: She is well-developed.  HENT:     Head: Normocephalic and atraumatic.  Eyes:     Conjunctiva/sclera: Conjunctivae normal.  Cardiovascular:     Rate and Rhythm: Normal rate and regular rhythm.     Heart sounds: Normal heart sounds.  Pulmonary:     Effort: Pulmonary effort is normal.     Breath sounds: Normal breath sounds.  Abdominal:     General: Bowel sounds are normal.     Palpations: Abdomen is soft.  Musculoskeletal:     Cervical back: Normal range of motion.  Lymphadenopathy:     Cervical: No cervical adenopathy.  Skin:    General: Skin is warm and dry.     Capillary Refill: Capillary refill takes less than 2 seconds.  Neurological:     Mental Status: She is alert and oriented to person, place, and time.  Psychiatric:        Behavior: Behavior normal.      Musculoskeletal Exam: C-spine was in good range of motion.  She has limited range of motion of her lumbar spine due to tight hamstrings.  She also has thoracic kyphosis.  Shoulder joints, elbow joints, wrist joints were in good range of motion with no synovitis.  She has bilateral PIP and DIP thickening.  Hip joints, knee joints, ankles, MTPs with good range of motion.  She has some DIP and PIP thickening in her feet consistent with osteoarthritis.  CDAI Exam: CDAI Score: 0.6  Patient Global: 3 mm; Provider Global: 3 mm Swollen: 0 ; Tender: 0  Joint Exam 07/26/2020   No joint exam has been documented for this visit   There is currently no information documented on the homunculus. Go to the Rheumatology activity and complete the homunculus joint exam.  Investigation: No additional findings.  Imaging: No results found.  Recent  Labs: Lab Results  Component Value Date   WBC 5.4 06/27/2020   HGB 13.2 06/27/2020   PLT 178 06/27/2020   NA 139 06/27/2020   K 4.1 06/27/2020   CL 104 06/27/2020   CO2 26 06/27/2020   GLUCOSE 112 (H) 06/27/2020   BUN 27 (H) 06/27/2020   CREATININE 0.74 06/27/2020   BILITOT 0.6 06/27/2020   ALKPHOS 77 06/27/2020   AST 25 06/27/2020   ALT 22 06/27/2020   PROT 6.6 06/27/2020   ALBUMIN 3.9 06/27/2020   CALCIUM 9.4 06/27/2020   GFRAA >60 06/27/2020   QFTBGOLD Negative 03/11/2017   QFTBGOLDPLUS NEGATIVE 02/23/2020    Speciality Comments: No specialty comments available.  Procedures:  No procedures performed Allergies: Tetanus toxoid   Assessment / Plan:     Visit Diagnoses: Rheumatoid arthritis involving multiple sites with positive rheumatoid factor (HCC) - +RF, +CCP, ANA-.  Patient states last week she had increased pain in her  joints but did not develop any swelling.  The pain resolved after 24 hours.  I do not see any synovitis on examination.  She has been getting Orencia infusions every 28 days which she has been tolerating well.  High risk medication use - Orencia 750 mg IV infusions every 28 days (last infusion was on 10/17/19).d/c Simponi Aria IV due to recurrent infections, d/c Enbrel due to lack of insurance, I reviewed her labs from July 2021 and discussed with patient.  Which are all within normal limits.  Primary osteoarthritis of both hands-joint protection muscle strengthening was discussed.  Primary osteoarthritis of both knees-she has chronic discomfort in her knee joints due to osteoarthritis.  Primary osteoarthritis of both feet-proper fitting shoes were emphasized.  Spondylosis of lumbar region without myelopathy or radiculopathy-she has limited range of motion but no radiculopathy.  A handout on back exercises was given.  Postural kyphosis-I have advised her to use a foam roller and some stretching exercises were discussed.  Age-related osteoporosis  without current pathological fracture - DXA done by Dr. Manuella Ghazi. Intolerance to Fosamax. T score improved per patient on Ca, VitD and exercise.  Ulyses Jarred is going under a lot of stress due to her husband's illness.  Her husband is receiving treatment for his stage IV lung cancer.  Insomnia-she is experiencing insomnia due to stress.  She is also feeling anxious.  Use of melatonin, chamomile tea and valerian root was discussed.  Good sleep hygiene was also emphasized.  Other medical problems are listed as follows:  History of migraine  History of vertigo  History of gastroesophageal reflux (GERD)  History of anemia  History of IBS  History of kidney stones  History of hyperlipidemia  Former smoker-patient had CT scan of her chest on March 2019.  Orders: No orders of the defined types were placed in this encounter.  No orders of the defined types were placed in this encounter.   Follow-Up Instructions: Return in about 5 months (around 12/26/2020) for Rheumatoid arthritis, Osteoarthritis.   Bo Merino, MD  Note - This record has been created using Editor, commissioning.  Chart creation errors have been sought, but may not always  have been located. Such creation errors do not reflect on  the standard of medical care.

## 2020-07-16 DIAGNOSIS — L03211 Cellulitis of face: Secondary | ICD-10-CM | POA: Diagnosis not present

## 2020-07-16 DIAGNOSIS — I10 Essential (primary) hypertension: Secondary | ICD-10-CM | POA: Diagnosis not present

## 2020-07-16 DIAGNOSIS — M069 Rheumatoid arthritis, unspecified: Secondary | ICD-10-CM | POA: Diagnosis not present

## 2020-07-16 DIAGNOSIS — Z299 Encounter for prophylactic measures, unspecified: Secondary | ICD-10-CM | POA: Diagnosis not present

## 2020-07-16 DIAGNOSIS — J449 Chronic obstructive pulmonary disease, unspecified: Secondary | ICD-10-CM | POA: Diagnosis not present

## 2020-07-17 DIAGNOSIS — I10 Essential (primary) hypertension: Secondary | ICD-10-CM | POA: Diagnosis not present

## 2020-07-17 DIAGNOSIS — Z299 Encounter for prophylactic measures, unspecified: Secondary | ICD-10-CM | POA: Diagnosis not present

## 2020-07-17 DIAGNOSIS — J309 Allergic rhinitis, unspecified: Secondary | ICD-10-CM | POA: Diagnosis not present

## 2020-07-17 DIAGNOSIS — M069 Rheumatoid arthritis, unspecified: Secondary | ICD-10-CM | POA: Diagnosis not present

## 2020-07-17 DIAGNOSIS — B079 Viral wart, unspecified: Secondary | ICD-10-CM | POA: Diagnosis not present

## 2020-07-17 DIAGNOSIS — J449 Chronic obstructive pulmonary disease, unspecified: Secondary | ICD-10-CM | POA: Diagnosis not present

## 2020-07-22 ENCOUNTER — Telehealth: Payer: Self-pay | Admitting: *Deleted

## 2020-07-22 NOTE — Telephone Encounter (Signed)
Patient contacted the office stating she has an opportunity to get the [employer] vaccine. Patient states she has her last Orencia infusion on 06/27/2020. Per Dr. Estanislado Pandy patient advised to wait until 07/25/2020 to get the booster vaccin. Patient advised to delay her infusion by 1 week.

## 2020-07-25 ENCOUNTER — Encounter (HOSPITAL_COMMUNITY): Payer: Self-pay

## 2020-07-25 ENCOUNTER — Encounter (HOSPITAL_COMMUNITY)
Admission: RE | Admit: 2020-07-25 | Discharge: 2020-07-25 | Disposition: A | Discharge status: Home / Self Care | Payer: Medicare Other | Source: Ambulatory Visit | Attending: Rheumatology | Admitting: Rheumatology

## 2020-07-25 ENCOUNTER — Encounter (HOSPITAL_COMMUNITY): Admission: RE | Admit: 2020-07-25 | Payer: Medicare Other | Source: Ambulatory Visit

## 2020-07-25 ENCOUNTER — Other Ambulatory Visit: Payer: Self-pay

## 2020-07-25 DIAGNOSIS — M069 Rheumatoid arthritis, unspecified: Secondary | ICD-10-CM | POA: Insufficient documentation

## 2020-07-25 DIAGNOSIS — M0579 Rheumatoid arthritis with rheumatoid factor of multiple sites without organ or systems involvement: Secondary | ICD-10-CM

## 2020-07-25 MED ORDER — ACETAMINOPHEN 325 MG PO TABS: 650.0000 mg | ORAL_TABLET | Freq: Once | ORAL | Status: DC

## 2020-07-25 MED ORDER — DIPHENHYDRAMINE HCL 25 MG PO CAPS: 25.0000 mg | ORAL_CAPSULE | Freq: Once | ORAL | Status: DC

## 2020-07-25 MED ORDER — SODIUM CHLORIDE 0.9 % IV SOLN
750.0000 mg | INTRAVENOUS | Status: DC
  Administered 2020-07-25: 750 mg via INTRAVENOUS
  Filled 2020-07-25: qty 30

## 2020-07-25 MED ORDER — SODIUM CHLORIDE 0.9 % IV SOLN: Freq: Once | INTRAVENOUS | Status: AC

## 2020-07-26 ENCOUNTER — Other Ambulatory Visit: Payer: Self-pay

## 2020-07-26 ENCOUNTER — Ambulatory Visit (INDEPENDENT_AMBULATORY_CARE_PROVIDER_SITE_OTHER): Payer: Medicare Other | Admitting: Rheumatology

## 2020-07-26 ENCOUNTER — Encounter: Payer: Self-pay | Admitting: Rheumatology

## 2020-07-26 VITALS — BP 117/70 | HR 83 | Resp 14 | Ht 65.0 in | Wt 169.4 lb

## 2020-07-26 DIAGNOSIS — M19041 Primary osteoarthritis, right hand: Secondary | ICD-10-CM | POA: Diagnosis not present

## 2020-07-26 DIAGNOSIS — Z862 Personal history of diseases of the blood and blood-forming organs and certain disorders involving the immune mechanism: Secondary | ICD-10-CM | POA: Diagnosis not present

## 2020-07-26 DIAGNOSIS — Z87898 Personal history of other specified conditions: Secondary | ICD-10-CM | POA: Diagnosis not present

## 2020-07-26 DIAGNOSIS — M170 Bilateral primary osteoarthritis of knee: Secondary | ICD-10-CM

## 2020-07-26 DIAGNOSIS — Z87891 Personal history of nicotine dependence: Secondary | ICD-10-CM

## 2020-07-26 DIAGNOSIS — Z87442 Personal history of urinary calculi: Secondary | ICD-10-CM

## 2020-07-26 DIAGNOSIS — Z79899 Other long term (current) drug therapy: Secondary | ICD-10-CM | POA: Diagnosis not present

## 2020-07-26 DIAGNOSIS — M4004 Postural kyphosis, thoracic region: Secondary | ICD-10-CM

## 2020-07-26 DIAGNOSIS — M19071 Primary osteoarthritis, right ankle and foot: Secondary | ICD-10-CM | POA: Diagnosis not present

## 2020-07-26 DIAGNOSIS — M47816 Spondylosis without myelopathy or radiculopathy, lumbar region: Secondary | ICD-10-CM | POA: Diagnosis not present

## 2020-07-26 DIAGNOSIS — Z8669 Personal history of other diseases of the nervous system and sense organs: Secondary | ICD-10-CM

## 2020-07-26 DIAGNOSIS — M19072 Primary osteoarthritis, left ankle and foot: Secondary | ICD-10-CM

## 2020-07-26 DIAGNOSIS — Z8639 Personal history of other endocrine, nutritional and metabolic disease: Secondary | ICD-10-CM

## 2020-07-26 DIAGNOSIS — G4709 Other insomnia: Secondary | ICD-10-CM

## 2020-07-26 DIAGNOSIS — Z8719 Personal history of other diseases of the digestive system: Secondary | ICD-10-CM | POA: Diagnosis not present

## 2020-07-26 DIAGNOSIS — M19042 Primary osteoarthritis, left hand: Secondary | ICD-10-CM

## 2020-07-26 DIAGNOSIS — F439 Reaction to severe stress, unspecified: Secondary | ICD-10-CM

## 2020-07-26 DIAGNOSIS — M810 Age-related osteoporosis without current pathological fracture: Secondary | ICD-10-CM

## 2020-07-26 DIAGNOSIS — M0579 Rheumatoid arthritis with rheumatoid factor of multiple sites without organ or systems involvement: Secondary | ICD-10-CM

## 2020-07-26 NOTE — Patient Instructions (Addendum)
COVID-19 vaccine recommendations:   COVID-19 vaccine is recommended for everyone (unless you are allergic to a vaccine component), even if you are on a medication that suppresses your immune system.   If you are on Methotrexate, Cellcept (mycophenolate), Rinvoq, Morrie Sheldon, and Olumiant- hold the medication for 1 week after each vaccine. Hold Methotrexate for 2 weeks after the single dose COVID-19 vaccine.   If you are on Orencia subcutaneous injection - hold medication one week prior to and one week after the first COVID-19 vaccine dose (only).   If you are on Orencia IV infusions- time vaccination administration so that the first COVID-19 vaccination will occur four weeks after the infusion and postpone the subsequent infusion by one week.   If you are on Cyclophosphamide or Rituxan infusions please contact your doctor prior to receiving the COVID-19 vaccine.   Do not take Tylenol or any anti-inflammatory medications (NSAIDs) 24 hours prior to the COVID-19 vaccination.   There is no direct evidence about the efficacy of the COVID-19 vaccine in individuals who are on medications that suppress the immune system.   Even if you are fully vaccinated, and you are on any medications that suppress your immune system, please continue to wear a mask, maintain at least six feet social distance and practice hand hygiene.   If you develop a COVID-19 infection, please contact your PCP or our office to determine if you need antibody infusion.  The booster vaccine is now available for immunocompromised patients. It is advised that if you had Pfizer vaccine you should get [employer] booster.  If you had a Moderna vaccine then you should get a Moderna booster. Johnson and Wynetta Emery does not have a booster vaccine at this time.  Please see the following web sites for updated information.    https://www.rheumatology.org/Portals/0/Files/COVID-19-Vaccination-Patient-Resources.pdf  https://www.rheumatology.org/About-Us/Newsroom/Press-Releases/ID/1159   Back Exercises The following exercises strengthen the muscles that help to support the trunk and back. They also help to keep the lower back flexible. Doing these exercises can help to prevent back pain or lessen existing pain.  If you have back pain or discomfort, try doing these exercises 2-3 times each day or as told by your health care provider.  As your pain improves, do them once each day, but increase the number of times that you repeat the steps for each exercise (do more repetitions).  To prevent the recurrence of back pain, continue to do these exercises once each day or as told by your health care provider. Do exercises exactly as told by your health care provider and adjust them as directed. It is normal to feel mild stretching, pulling, tightness, or discomfort as you do these exercises, but you should stop right away if you feel sudden pain or your pain gets worse. Exercises Single knee to chest Repeat these steps 3-5 times for each leg: 1. Lie on your back on a firm bed or the floor with your legs extended. 2. Bring one knee to your chest. Your other leg should stay extended and in contact with the floor. 3. Hold your knee in place by grabbing your knee or thigh with both hands and hold. 4. Pull on your knee until you feel a gentle stretch in your lower back or buttocks. 5. Hold the stretch for 10-30 seconds. 6. Slowly release and straighten your leg. Pelvic tilt Repeat these steps 5-10 times: 1. Lie on your back on a firm bed or the floor with your legs extended. 2. Bend your knees so they are pointing toward  the ceiling and your feet are flat on the floor. 3. Tighten your lower abdominal muscles to press your lower back against the floor. This motion will tilt your pelvis so your tailbone points up toward the  ceiling instead of pointing to your feet or the floor. 4. With gentle tension and even breathing, hold this position for 5-10 seconds. Cat-cow Repeat these steps until your lower back becomes more flexible: 1. Get into a hands-and-knees position on a firm surface. Keep your hands under your shoulders, and keep your knees under your hips. You may place padding under your knees for comfort. 2. Let your head hang down toward your chest. Contract your abdominal muscles and point your tailbone toward the floor so your lower back becomes rounded like the back of a cat. 3. Hold this position for 5 seconds. 4. Slowly lift your head, let your abdominal muscles relax and point your tailbone up toward the ceiling so your back forms a sagging arch like the back of a cow. 5. Hold this position for 5 seconds.  Press-ups Repeat these steps 5-10 times: 1. Lie on your abdomen (face-down) on the floor. 2. Place your palms near your head, about shoulder-width apart. 3. Keeping your back as relaxed as possible and keeping your hips on the floor, slowly straighten your arms to raise the top half of your body and lift your shoulders. Do not use your back muscles to raise your upper torso. You may adjust the placement of your hands to make yourself more comfortable. 4. Hold this position for 5 seconds while you keep your back relaxed. 5. Slowly return to lying flat on the floor.  Bridges Repeat these steps 10 times: 1. Lie on your back on a firm surface. 2. Bend your knees so they are pointing toward the ceiling and your feet are flat on the floor. Your arms should be flat at your sides, next to your body. 3. Tighten your buttocks muscles and lift your buttocks off the floor until your waist is at almost the same height as your knees. You should feel the muscles working in your buttocks and the back of your thighs. If you do not feel these muscles, slide your feet 1-2 inches farther away from your buttocks. 4. Hold  this position for 3-5 seconds. 5. Slowly lower your hips to the starting position, and allow your buttocks muscles to relax completely. If this exercise is too easy, try doing it with your arms crossed over your chest. Abdominal crunches Repeat these steps 5-10 times: 1. Lie on your back on a firm bed or the floor with your legs extended. 2. Bend your knees so they are pointing toward the ceiling and your feet are flat on the floor. 3. Cross your arms over your chest. 4. Tip your chin slightly toward your chest without bending your neck. 5. Tighten your abdominal muscles and slowly raise your trunk (torso) high enough to lift your shoulder blades a tiny bit off the floor. Avoid raising your torso higher than that because it can put too much stress on your low back and does not help to strengthen your abdominal muscles. 6. Slowly return to your starting position. Back lifts Repeat these steps 5-10 times: 1. Lie on your abdomen (face-down) with your arms at your sides, and rest your forehead on the floor. 2. Tighten the muscles in your legs and your buttocks. 3. Slowly lift your chest off the floor while you keep your hips pressed to the floor.  Keep the back of your head in line with the curve in your back. Your eyes should be looking at the floor. 4. Hold this position for 3-5 seconds. 5. Slowly return to your starting position. Contact a health care provider if:  Your back pain or discomfort gets much worse when you do an exercise.  Your worsening back pain or discomfort does not lessen within 2 hours after you exercise. If you have any of these problems, stop doing these exercises right away. Do not do them again unless your health care provider says that you can. Get help right away if:  You develop sudden, severe back pain. If this happens, stop doing the exercises right away. Do not do them again unless your health care provider says that you can. This information is not intended to  replace advice given to you by your health care provider. Make sure you discuss any questions you have with your health care provider. Document Revised: 03/30/2019 Document Reviewed: 08/25/2018 Elsevier Patient Education  [place].

## 2020-08-05 DIAGNOSIS — Z87891 Personal history of nicotine dependence: Secondary | ICD-10-CM | POA: Diagnosis not present

## 2020-08-05 DIAGNOSIS — Z299 Encounter for prophylactic measures, unspecified: Secondary | ICD-10-CM | POA: Diagnosis not present

## 2020-08-05 DIAGNOSIS — J449 Chronic obstructive pulmonary disease, unspecified: Secondary | ICD-10-CM | POA: Diagnosis not present

## 2020-08-05 DIAGNOSIS — I10 Essential (primary) hypertension: Secondary | ICD-10-CM | POA: Diagnosis not present

## 2020-08-05 DIAGNOSIS — J441 Chronic obstructive pulmonary disease with (acute) exacerbation: Secondary | ICD-10-CM | POA: Diagnosis not present

## 2020-08-05 DIAGNOSIS — J309 Allergic rhinitis, unspecified: Secondary | ICD-10-CM | POA: Diagnosis not present

## 2020-08-14 DIAGNOSIS — Z23 Encounter for immunization: Secondary | ICD-10-CM | POA: Diagnosis not present

## 2020-08-19 ENCOUNTER — Telehealth: Payer: Self-pay | Admitting: Rheumatology

## 2020-08-19 NOTE — Telephone Encounter (Signed)
Patient advised she is allowed to have the Covid vaccine one week from the time she had her flu vaccine. Patient states she will call to confirm her appointment with the vaccine clinic.

## 2020-08-19 NOTE — Telephone Encounter (Signed)
Patient called stating she is scheduled for her COVID vaccine on Thursday, 08/22/20.  Patient states she had her flu vaccine on 08/14/20.  Patient states she was unable to pre-register through [facility] and was told she needed to contact Dr. Arlean Hopping office to get approval to have COVID vaccine.  Patient states she had no reaction with the flu vaccine.

## 2020-08-22 ENCOUNTER — Ambulatory Visit: Payer: Medicare Other | Attending: Internal Medicine

## 2020-08-22 ENCOUNTER — Ambulatory Visit: Payer: Self-pay

## 2020-08-22 ENCOUNTER — Ambulatory Visit: Payer: Medicare Other

## 2020-08-22 DIAGNOSIS — Z23 Encounter for immunization: Secondary | ICD-10-CM

## 2020-08-22 NOTE — Progress Notes (Signed)
° °  Covid-19 Vaccination Clinic  Name:  Tiffany Pollard    MRN: [id number] DOB: November 22, 1951  08/22/2020  Tiffany Pollard was observed post Covid-19 immunization for 15 minutes without incident. She was provided with Vaccine Information Sheet and instruction to access the V-Safe system.   Tiffany Pollard was instructed to call 911 with any severe reactions post vaccine:  Difficulty breathing   Swelling of face and throat   A fast heartbeat   A bad rash all over body   Dizziness and weakness

## 2020-08-27 DIAGNOSIS — B078 Other viral warts: Secondary | ICD-10-CM | POA: Diagnosis not present

## 2020-08-27 DIAGNOSIS — D485 Neoplasm of uncertain behavior of skin: Secondary | ICD-10-CM | POA: Diagnosis not present

## 2020-08-29 ENCOUNTER — Encounter (HOSPITAL_COMMUNITY): Payer: Self-pay

## 2020-08-29 ENCOUNTER — Other Ambulatory Visit: Payer: Self-pay

## 2020-08-29 ENCOUNTER — Encounter (HOSPITAL_COMMUNITY)
Admission: RE | Admit: 2020-08-29 | Discharge: 2020-08-29 | Disposition: A | Discharge status: Home / Self Care | Payer: Medicare Other | Source: Ambulatory Visit | Attending: Rheumatology | Admitting: Rheumatology

## 2020-08-29 DIAGNOSIS — M0589 Other rheumatoid arthritis with rheumatoid factor of multiple sites: Secondary | ICD-10-CM | POA: Insufficient documentation

## 2020-08-29 LAB — COMPREHENSIVE METABOLIC PANEL
ALT: 38 U/L (ref 0–44)
AST: 28 U/L (ref 15–41)
Albumin: 4 g/dL (ref 3.5–5.0)
Alkaline Phosphatase: 91 U/L (ref 38–126)
Anion gap: 6 (ref 5–15)
BUN: 24 mg/dL — ABNORMAL HIGH (ref 8–23)
CO2: 30 mmol/L (ref 22–32)
Calcium: 9.3 mg/dL (ref 8.9–10.3)
Chloride: 101 mmol/L (ref 98–111)
Creatinine, Ser: 0.68 mg/dL (ref 0.44–1.00)
GFR calc Af Amer: >60 mL/min (ref >60)
GFR calc non Af Amer: >60 mL/min (ref >60)
Glucose, Bld: 73 mg/dL (ref 70–99)
Potassium: 4.3 mmol/L (ref 3.5–5.1)
Sodium: 137 mmol/L (ref 135–145)
Total Bilirubin: 0.6 mg/dL (ref 0.3–1.2)
Total Protein: 6.6 g/dL (ref 6.5–8.1)

## 2020-08-29 LAB — CBC WITH DIFFERENTIAL/PLATELET
Abs Immature Granulocytes: 0.02 10*3/uL (ref 0.00–0.07)
Basophils Absolute: 0 10*3/uL (ref 0.0–0.1)
Basophils Relative: 0 %
Eosinophils Absolute: 0.5 10*3/uL (ref 0.0–0.5)
Eosinophils Relative: 10 %
HCT: 41.1 % (ref 36.0–46.0)
Hemoglobin: 13 g/dL (ref 12.0–15.0)
Immature Granulocytes: 0 %
Lymphocytes Relative: 26 %
Lymphs Abs: 1.3 10*3/uL (ref 0.7–4.0)
MCH: 28.5 pg (ref 26.0–34.0)
MCHC: 31.6 g/dL (ref 30.0–36.0)
MCV: 90.1 fL (ref 80.0–100.0)
Monocytes Absolute: 0.6 10*3/uL (ref 0.1–1.0)
Monocytes Relative: 12 %
Neutro Abs: 2.7 10*3/uL (ref 1.7–7.7)
Neutrophils Relative %: 52 %
Platelets: 166 10*3/uL (ref 150–400)
RBC: 4.56 MIL/uL (ref 3.87–5.11)
RDW: 13.3 % (ref 11.5–15.5)
WBC: 5.2 10*3/uL (ref 4.0–10.5)
nRBC: 0 % (ref 0.0–0.2)

## 2020-08-29 MED ORDER — SODIUM CHLORIDE 0.9 % IV SOLN: Freq: Once | INTRAVENOUS | Status: AC

## 2020-08-29 MED ORDER — SODIUM CHLORIDE 0.9 % IV SOLN
750.0000 mg | Freq: Once | INTRAVENOUS | Status: AC
  Administered 2020-08-29: 750 mg via INTRAVENOUS
  Filled 2020-08-29: qty 30

## 2020-09-11 IMAGING — MG DIGITAL SCREENING BILATERAL MAMMOGRAM WITH TOMO AND CAD
6 of 10 series · 6 of 30 positions shown · non-contrast
Comparison: Previous exam(s).

CLINICAL DATA: Screening.

EXAM:
DIGITAL SCREENING BILATERAL MAMMOGRAM WITH TOMO AND CAD

[L CC synth-2D]
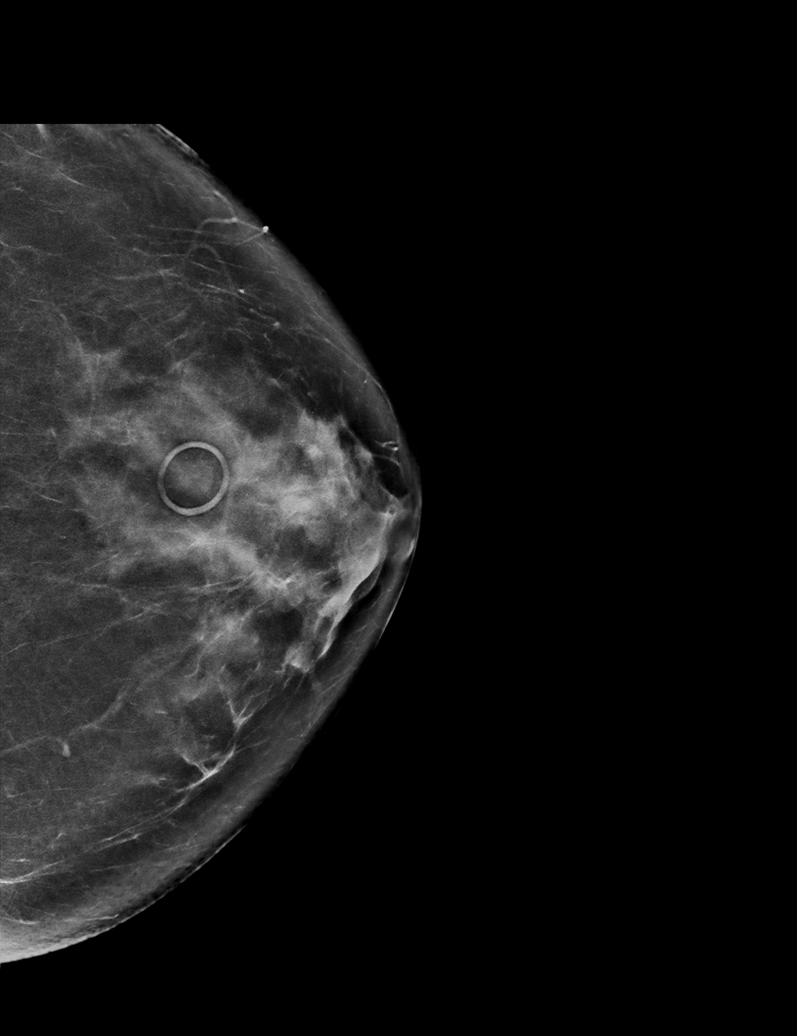

[R CC synth-2D]
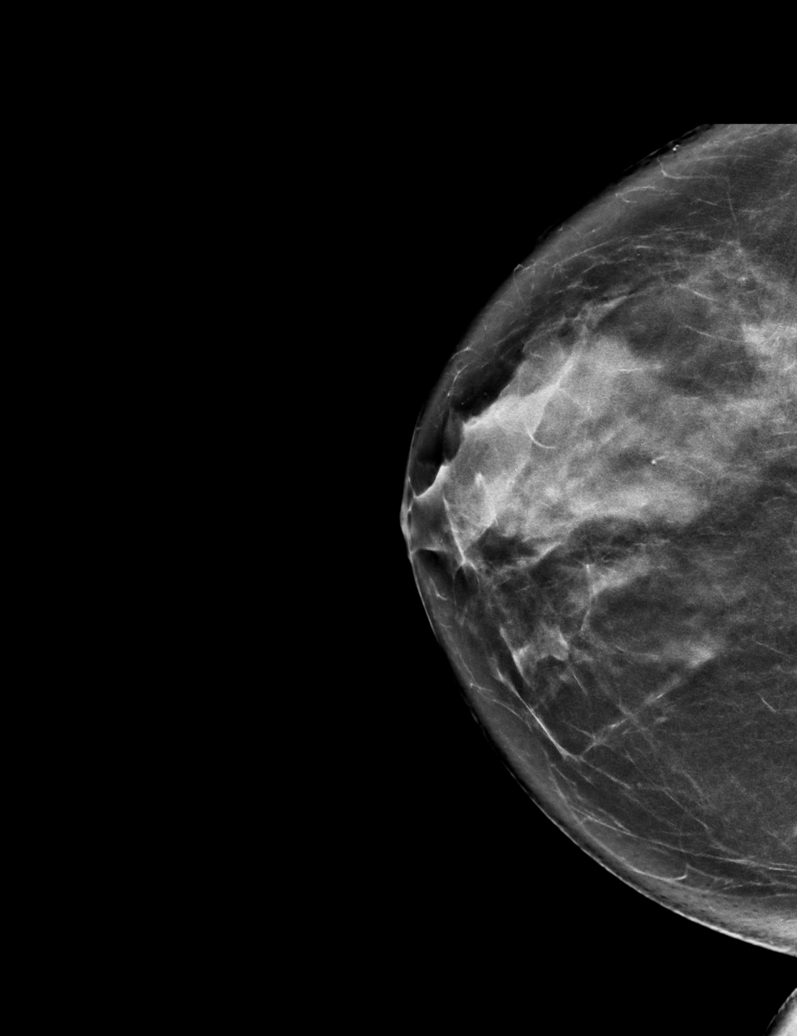

[R MLO synth-2D (1 of 2)]
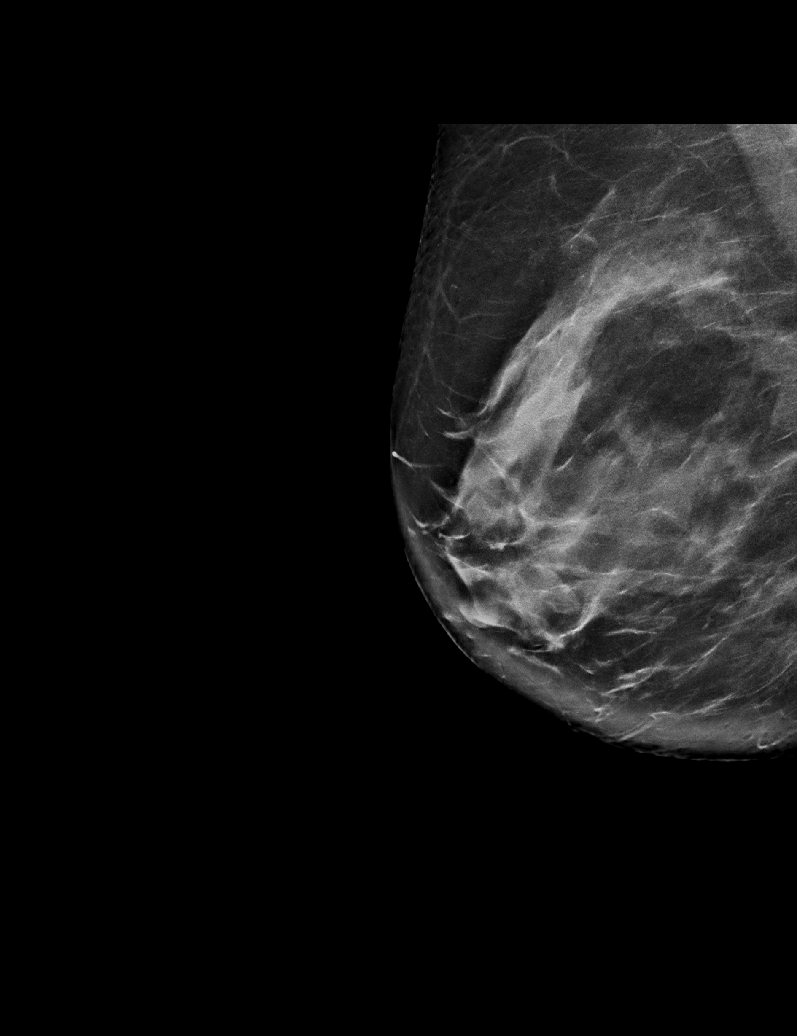

[R MLO synth-2D (2 of 2)]
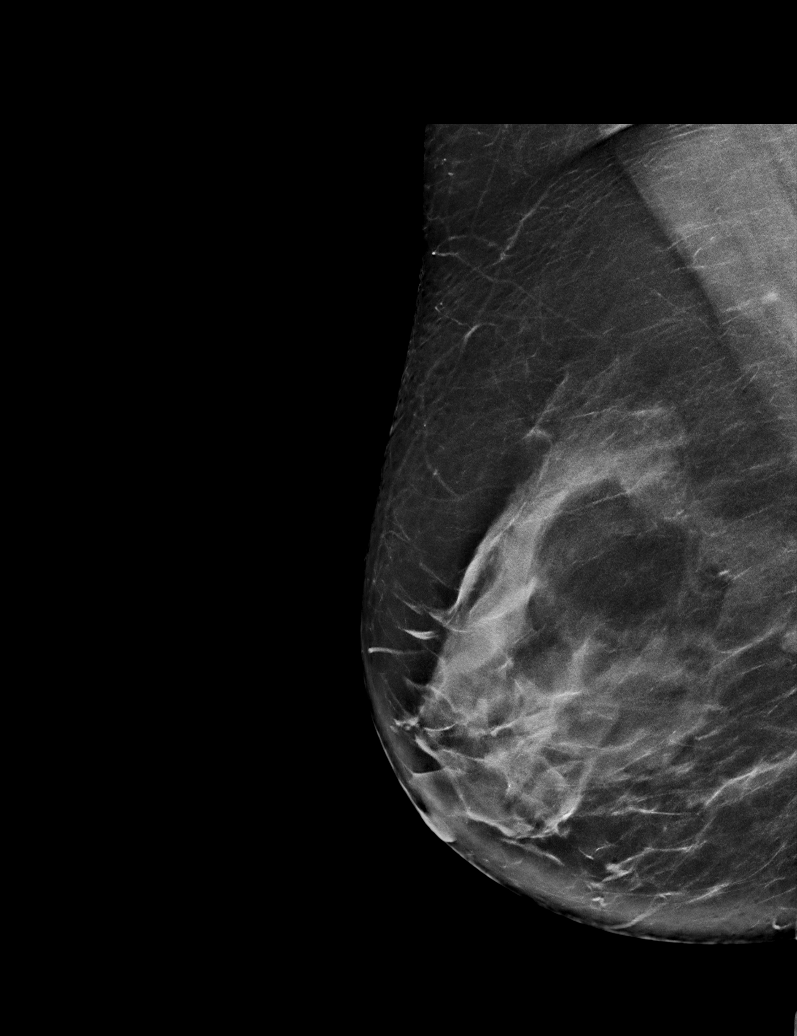

[L MLO synth-2D]
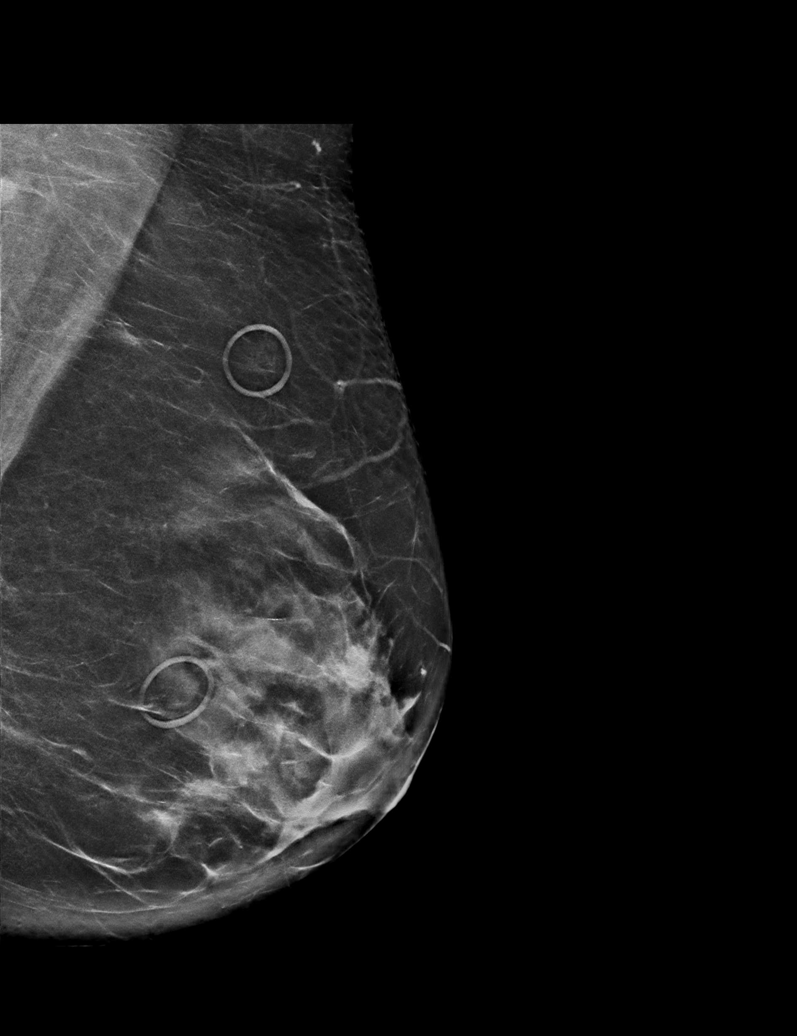

[L MLO tomo · tomo slice 39/76.0]
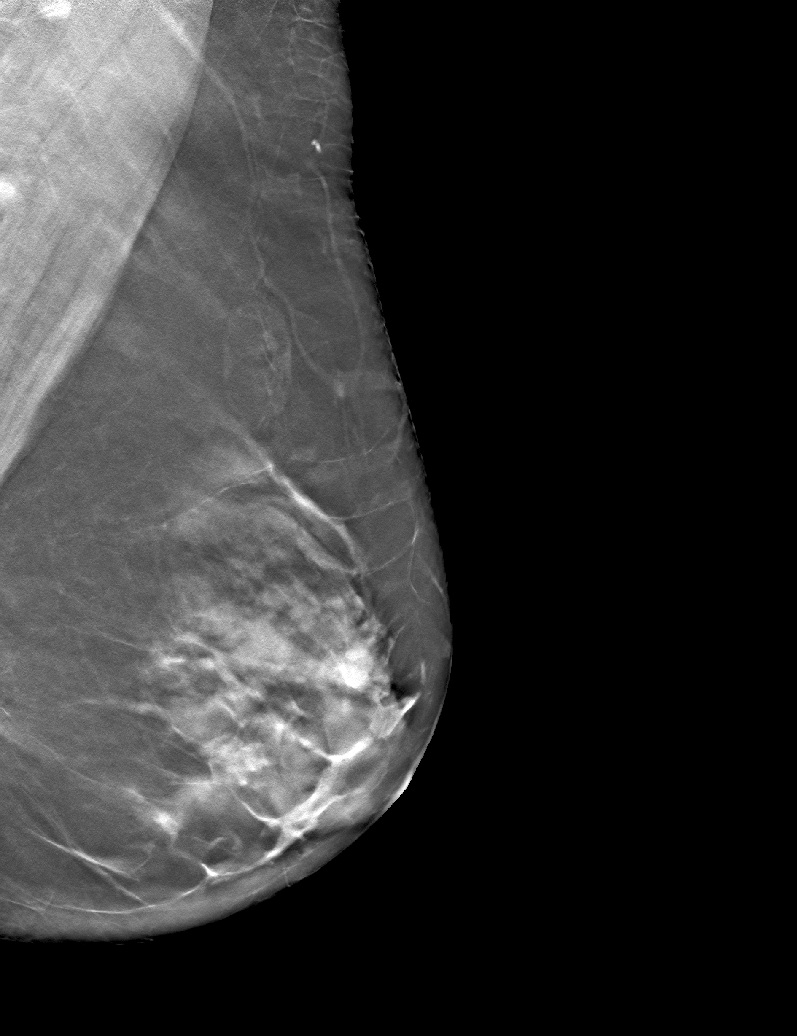

[6 of 30 positions shown; findings below may reference images not displayed]

ACR Breast Density Category c: The breast tissue is heterogeneously
dense, which may obscure small masses.
FINDINGS: There are no findings suspicious for malignancy. Images were
processed with CAD.
IMPRESSION: No mammographic evidence of malignancy. A result letter of this
screening mammogram will be mailed directly to the patient.

RECOMMENDATION:
Screening mammogram in one year. (Code:[id number])

BI-RADS CATEGORY  1: Negative.

## 2020-09-26 ENCOUNTER — Encounter (HOSPITAL_COMMUNITY)
Admission: RE | Admit: 2020-09-26 | Discharge: 2020-09-26 | Disposition: A | Discharge status: Home / Self Care | Payer: Medicare Other | Source: Ambulatory Visit | Attending: Rheumatology | Admitting: Rheumatology

## 2020-09-26 ENCOUNTER — Other Ambulatory Visit: Payer: Self-pay

## 2020-09-26 ENCOUNTER — Encounter (HOSPITAL_COMMUNITY): Payer: Self-pay

## 2020-09-26 DIAGNOSIS — M059 Rheumatoid arthritis with rheumatoid factor, unspecified: Secondary | ICD-10-CM | POA: Diagnosis not present

## 2020-09-26 LAB — COMPREHENSIVE METABOLIC PANEL
ALT: 27 U/L (ref 0–44)
AST: 28 U/L (ref 15–41)
Albumin: 4.1 g/dL (ref 3.5–5.0)
Alkaline Phosphatase: 83 U/L (ref 38–126)
Anion gap: 11 (ref 5–15)
BUN: 19 mg/dL (ref 8–23)
CO2: 26 mmol/L (ref 22–32)
Calcium: 9.8 mg/dL (ref 8.9–10.3)
Chloride: 102 mmol/L (ref 98–111)
Creatinine, Ser: 0.73 mg/dL (ref 0.44–1.00)
GFR, Estimated: >60 mL/min (ref >60)
Glucose, Bld: 71 mg/dL (ref 70–99)
Potassium: 4.1 mmol/L (ref 3.5–5.1)
Sodium: 139 mmol/L (ref 135–145)
Total Bilirubin: 0.6 mg/dL (ref 0.3–1.2)
Total Protein: 6.8 g/dL (ref 6.5–8.1)

## 2020-09-26 LAB — CBC
HCT: 40.3 % (ref 36.0–46.0)
Hemoglobin: 13 g/dL (ref 12.0–15.0)
MCH: 28 pg (ref 26.0–34.0)
MCHC: 32.3 g/dL (ref 30.0–36.0)
MCV: 86.9 fL (ref 80.0–100.0)
Platelets: 207 10*3/uL (ref 150–400)
RBC: 4.64 MIL/uL (ref 3.87–5.11)
RDW: 13.2 % (ref 11.5–15.5)
WBC: 5.1 10*3/uL (ref 4.0–10.5)
nRBC: 0 % (ref 0.0–0.2)

## 2020-09-26 MED ORDER — SODIUM CHLORIDE 0.9 % IV SOLN
750.0000 mg | Freq: Once | INTRAVENOUS | Status: AC
  Administered 2020-09-26: 750 mg via INTRAVENOUS
  Filled 2020-09-26: qty 30

## 2020-09-26 MED ORDER — SODIUM CHLORIDE 0.9 % IV SOLN: Freq: Once | INTRAVENOUS | Status: AC

## 2020-09-26 NOTE — Progress Notes (Signed)
CBC and CMP normal

## 2020-10-15 DIAGNOSIS — Z87891 Personal history of nicotine dependence: Secondary | ICD-10-CM | POA: Diagnosis not present

## 2020-10-15 DIAGNOSIS — J449 Chronic obstructive pulmonary disease, unspecified: Secondary | ICD-10-CM | POA: Diagnosis not present

## 2020-10-15 DIAGNOSIS — M069 Rheumatoid arthritis, unspecified: Secondary | ICD-10-CM | POA: Diagnosis not present

## 2020-10-15 DIAGNOSIS — I10 Essential (primary) hypertension: Secondary | ICD-10-CM | POA: Diagnosis not present

## 2020-10-15 DIAGNOSIS — Z299 Encounter for prophylactic measures, unspecified: Secondary | ICD-10-CM | POA: Diagnosis not present

## 2020-10-24 ENCOUNTER — Other Ambulatory Visit: Payer: Self-pay

## 2020-10-24 ENCOUNTER — Encounter (HOSPITAL_COMMUNITY): Payer: Self-pay

## 2020-10-24 ENCOUNTER — Encounter (HOSPITAL_COMMUNITY)
Admission: RE | Admit: 2020-10-24 | Discharge: 2020-10-24 | Disposition: A | Discharge status: Home / Self Care | Payer: Medicare Other | Source: Ambulatory Visit | Attending: Rheumatology | Admitting: Rheumatology

## 2020-10-24 DIAGNOSIS — M0589 Other rheumatoid arthritis with rheumatoid factor of multiple sites: Secondary | ICD-10-CM | POA: Insufficient documentation

## 2020-10-24 MED ORDER — DIPHENHYDRAMINE HCL 25 MG PO CAPS: 25.0000 mg | ORAL_CAPSULE | Freq: Once | ORAL | Status: DC

## 2020-10-24 MED ORDER — SODIUM CHLORIDE 0.9 % IV SOLN
750.0000 mg | Freq: Once | INTRAVENOUS | Status: AC
  Administered 2020-10-24: 750 mg via INTRAVENOUS
  Filled 2020-10-24: qty 30

## 2020-10-24 MED ORDER — ACETAMINOPHEN 325 MG PO TABS: 650.0000 mg | ORAL_TABLET | Freq: Once | ORAL | Status: DC

## 2020-10-24 MED ORDER — SODIUM CHLORIDE 0.9 % IV SOLN: Freq: Once | INTRAVENOUS | Status: AC

## 2020-10-29 DIAGNOSIS — F419 Anxiety disorder, unspecified: Secondary | ICD-10-CM | POA: Diagnosis not present

## 2020-10-29 DIAGNOSIS — Z299 Encounter for prophylactic measures, unspecified: Secondary | ICD-10-CM | POA: Diagnosis not present

## 2020-10-29 DIAGNOSIS — R5383 Other fatigue: Secondary | ICD-10-CM | POA: Diagnosis not present

## 2020-10-29 DIAGNOSIS — Z1339 Encounter for screening examination for other mental health and behavioral disorders: Secondary | ICD-10-CM | POA: Diagnosis not present

## 2020-10-29 DIAGNOSIS — E559 Vitamin D deficiency, unspecified: Secondary | ICD-10-CM | POA: Diagnosis not present

## 2020-10-29 DIAGNOSIS — I10 Essential (primary) hypertension: Secondary | ICD-10-CM | POA: Diagnosis not present

## 2020-10-29 DIAGNOSIS — Z0000 Encounter for general adult medical examination without abnormal findings: Secondary | ICD-10-CM | POA: Diagnosis not present

## 2020-10-29 DIAGNOSIS — Z1331 Encounter for screening for depression: Secondary | ICD-10-CM | POA: Diagnosis not present

## 2020-10-29 DIAGNOSIS — Z79899 Other long term (current) drug therapy: Secondary | ICD-10-CM | POA: Diagnosis not present

## 2020-10-29 DIAGNOSIS — Z7189 Other specified counseling: Secondary | ICD-10-CM | POA: Diagnosis not present

## 2020-10-29 DIAGNOSIS — Z6827 Body mass index (BMI) 27.0-27.9, adult: Secondary | ICD-10-CM | POA: Diagnosis not present

## 2020-10-29 DIAGNOSIS — E7800 Pure hypercholesterolemia, unspecified: Secondary | ICD-10-CM | POA: Diagnosis not present

## 2020-11-15 ENCOUNTER — Telehealth: Payer: Self-pay | Admitting: Pharmacy Technician

## 2020-11-15 NOTE — Telephone Encounter (Signed)
Medicare and Supplement: Covers deductible  Medicare covers 80% of the infusion and no authorization is required, and the patient's AARP supplement would cover the 20% of the cost that was not paid for by Medicare as long as Medicare covered the medication. The Supplement also covers the patient's Medicare deductible

## 2020-11-21 ENCOUNTER — Encounter (HOSPITAL_COMMUNITY)
Admission: RE | Admit: 2020-11-21 | Discharge: 2020-11-21 | Disposition: A | Discharge status: Home / Self Care | Payer: Medicare Other | Source: Ambulatory Visit | Attending: Rheumatology | Admitting: Rheumatology

## 2020-11-21 ENCOUNTER — Encounter (HOSPITAL_COMMUNITY): Payer: Self-pay

## 2020-11-21 ENCOUNTER — Other Ambulatory Visit: Payer: Self-pay

## 2020-11-21 DIAGNOSIS — M0589 Other rheumatoid arthritis with rheumatoid factor of multiple sites: Secondary | ICD-10-CM | POA: Diagnosis not present

## 2020-11-21 LAB — COMPREHENSIVE METABOLIC PANEL
ALT: 25 U/L (ref 0–44)
AST: 29 U/L (ref 15–41)
Albumin: 4 g/dL (ref 3.5–5.0)
Alkaline Phosphatase: 85 U/L (ref 38–126)
Anion gap: 7 (ref 5–15)
BUN: 23 mg/dL (ref 8–23)
CO2: 27 mmol/L (ref 22–32)
Calcium: 9.2 mg/dL (ref 8.9–10.3)
Chloride: 102 mmol/L (ref 98–111)
Creatinine, Ser: 0.7 mg/dL (ref 0.44–1.00)
GFR, Estimated: >60 mL/min (ref >60)
Glucose, Bld: 94 mg/dL (ref 70–99)
Potassium: 4.1 mmol/L (ref 3.5–5.1)
Sodium: 136 mmol/L (ref 135–145)
Total Bilirubin: 0.6 mg/dL (ref 0.3–1.2)
Total Protein: 6.7 g/dL (ref 6.5–8.1)

## 2020-11-21 LAB — CBC
HCT: 40.6 % (ref 36.0–46.0)
Hemoglobin: 12.8 g/dL (ref 12.0–15.0)
MCH: 28.3 pg (ref 26.0–34.0)
MCHC: 31.5 g/dL (ref 30.0–36.0)
MCV: 89.6 fL (ref 80.0–100.0)
Platelets: 192 10*3/uL (ref 150–400)
RBC: 4.53 MIL/uL (ref 3.87–5.11)
RDW: 13.5 % (ref 11.5–15.5)
WBC: 5.9 10*3/uL (ref 4.0–10.5)
nRBC: 0 % (ref 0.0–0.2)

## 2020-11-21 MED ORDER — SODIUM CHLORIDE 0.9 % IV SOLN
750.0000 mg | Freq: Once | INTRAVENOUS | Status: AC
  Administered 2020-11-21: 09:00:00 750 mg via INTRAVENOUS
  Filled 2020-11-21: qty 30

## 2020-11-21 MED ORDER — DIPHENHYDRAMINE HCL 25 MG PO CAPS: 25.0000 mg | ORAL_CAPSULE | Freq: Once | ORAL | Status: DC

## 2020-11-21 MED ORDER — ACETAMINOPHEN 325 MG PO TABS: 650.0000 mg | ORAL_TABLET | Freq: Once | ORAL | Status: DC

## 2020-11-21 MED ORDER — SODIUM CHLORIDE 0.9 % IV SOLN: Freq: Once | INTRAVENOUS | Status: AC

## 2020-11-21 NOTE — Progress Notes (Signed)
CBC and CMP are normal.

## 2020-12-13 NOTE — Progress Notes (Signed)
Office Visit Note  Patient: Tiffany Pollard             Date of Birth: July 07, 1951           MRN: [id number]             PCP: Monico Blitz, MD Referring: Monico Blitz, MD Visit Date: 12/27/2020 Occupation: @GUAROCC @  Subjective:  Medication management   History of Present Illness: Tiffany Pollard is a 70 y.o. female with history of rheumatoid arthritis and osteoarthritis.  She states about a week ago she started developing some swelling in her right hand which she describes over the right second and third MCP joints.  She states her right thumb was also hurting but then the symptoms subsided.  She has been tolerating Orencia infusions well and has not had any infections recently.  Activities of Daily Living:  Patient reports morning stiffness for 1 hour.   Patient Reports nocturnal pain.  Difficulty dressing/grooming: Denies Difficulty climbing stairs: Denies Difficulty getting out of chair: Denies Difficulty using hands for taps, buttons, cutlery, and/or writing: Reports  Review of Systems  Constitutional: Positive for fatigue. Negative for night sweats, weight gain and weight loss.  HENT: Positive for mouth dryness. Negative for mouth sores, trouble swallowing, trouble swallowing and nose dryness.   Eyes: Positive for dryness. Negative for pain, redness and visual disturbance.  Respiratory: Negative for cough, shortness of breath and difficulty breathing.   Cardiovascular: Negative for chest pain, palpitations, hypertension, irregular heartbeat and swelling in legs/feet.  Gastrointestinal: Negative for blood in stool, constipation and diarrhea.  Endocrine: Positive for heat intolerance. Negative for increased urination.  Genitourinary: Negative for difficulty urinating and vaginal dryness.  Musculoskeletal: Positive for arthralgias, joint pain, joint swelling and morning stiffness. Negative for myalgias, muscle weakness, muscle tenderness and myalgias.  Skin: Negative for color  change, rash, hair loss, skin tightness, ulcers and sensitivity to sunlight.  Allergic/Immunologic: Negative for susceptible to infections.  Neurological: Negative for dizziness, numbness, memory loss, night sweats and weakness.  Hematological: Positive for bruising/bleeding tendency. Negative for swollen glands.  Psychiatric/Behavioral: Positive for sleep disturbance. Negative for depressed mood. The patient is not nervous/anxious.     PMFS History:  Patient Active Problem List   Diagnosis Date Noted  . Primary osteoarthritis of both knees 04/30/2017  . History of anemia 04/30/2017  . History of migraine 04/30/2017  . History of vertigo 04/30/2017  . History of gastroesophageal reflux (GERD) 04/30/2017  . History of kidney stones 04/30/2017  . History of IBS 04/30/2017  . History of hyperlipidemia 04/30/2017  . Hypercalcemia 04/30/2017  . Rheumatoid arthritis involving multiple sites with positive rheumatoid factor ([place]) 12/08/2016  . High risk medication use 12/08/2016  . Primary osteoarthritis of both hands 12/08/2016  . Primary osteoarthritis of both feet 12/08/2016  . Osteoarthritis of lumbar spine 12/08/2016  . Former smoker 12/08/2016  . Abdominal pain, chronic, right lower quadrant 12/11/2013    Past Medical History:  Diagnosis Date  . GERD (gastroesophageal reflux disease)   . High blood pressure   . Rheumatoid arthritis(714.0)     History reviewed. No pertinent family history. Past Surgical History:  Procedure Laterality Date  . BACK SURGERY    . CERVICAL CONIZATION W/BX    . COLONOSCOPY N/A 12/27/2013   Procedure: COLONOSCOPY;  Surgeon: Rogene Houston, MD;  Location: [facility];  Service: Endoscopy;  Laterality: N/A;  320-moved to [address] to notify pt  . FINGER FRACTURE SURGERY    .  foot surgery for a bunion    . NASAL SINUS SURGERY    . TONSILLECTOMY     Social History   Social History Narrative  . Not on file   Immunization History  Administered  Date(s) Administered  . Influenza,inj,Quad PF,6+ Mos 08/18/2017  . PFIZER(Purple Top)SARS-COV-2 Vaccination 01/12/2020, 02/02/2020, 08/22/2020  . Pneumococcal Conjugate-13 07/13/2018     Objective: Vital Signs: BP (!) 118/59 (BP Location: Left Arm, Patient Position: Sitting, Cuff Size: Normal)   Pulse 77   Resp 16   Ht 5\' 5"  (1.651 m)   Wt 172 lb 6.4 oz (78.2 kg)   BMI 28.69 kg/m    Physical Exam Vitals and nursing note reviewed.  Constitutional:      Appearance: She is well-developed and well-nourished.  HENT:     Head: Normocephalic and atraumatic.  Eyes:     Extraocular Movements: EOM normal.     Conjunctiva/sclera: Conjunctivae normal.  Cardiovascular:     Rate and Rhythm: Normal rate and regular rhythm.     Pulses: Intact distal pulses.     Heart sounds: Normal heart sounds.  Pulmonary:     Effort: Pulmonary effort is normal.     Breath sounds: Normal breath sounds.  Abdominal:     General: Bowel sounds are normal.     Palpations: Abdomen is soft.  Musculoskeletal:     Cervical back: Normal range of motion.  Lymphadenopathy:     Cervical: No cervical adenopathy.  Skin:    General: Skin is warm and dry.     Capillary Refill: Capillary refill takes less than 2 seconds.  Neurological:     Mental Status: She is alert and oriented to person, place, and time.  Psychiatric:        Mood and Affect: Mood and affect normal.        Behavior: Behavior normal.      Musculoskeletal Exam: C-spine was in good range of motion.  Shoulder joints, elbow joints, wrist joints, MCPs PIPs and DIPs with good range of motion.  She had bilateral PIP and DIP thickening.  No synovitis was noted.  Hip joints, knee joints, ankles with good range of motion.  She had no tenderness over ankles or MTPs.  CDAI Exam: CDAI Score: 0.6  Patient Global: 3 mm; Provider Global: 3 mm Swollen: 0 ; Tender: 0  Joint Exam 12/27/2020   No joint exam has been documented for this visit   There is  currently no information documented on the homunculus. Go to the Rheumatology activity and complete the homunculus joint exam.  Investigation: No additional findings.  Imaging: No results found.  Recent Labs: Lab Results  Component Value Date   WBC 5.9 11/21/2020   HGB 12.8 11/21/2020   PLT 192 11/21/2020   NA 136 11/21/2020   K 4.1 11/21/2020   CL 102 11/21/2020   CO2 27 11/21/2020   GLUCOSE 94 11/21/2020   BUN 23 11/21/2020   CREATININE 0.70 11/21/2020   BILITOT 0.6 11/21/2020   ALKPHOS 85 11/21/2020   AST 29 11/21/2020   ALT 25 11/21/2020   PROT 6.7 11/21/2020   ALBUMIN 4.0 11/21/2020   CALCIUM 9.2 11/21/2020   GFRAA >60 08/29/2020   QFTBGOLD Negative 03/11/2017   QFTBGOLDPLUS NEGATIVE 02/23/2020    Speciality Comments: No specialty comments available.  Procedures:  No procedures performed Allergies: Tetanus toxoid   Assessment / Plan:     Visit Diagnoses: Rheumatoid arthritis involving multiple sites with positive rheumatoid factor (HCC) -  +  RF, +CCP, ANA-.  Patient is clinically doing well.  She states she has not had any infections on Orencia which was an issue with TNF inhibitors.  She had recent flare about a week ago which subsided by itself in 2 days.  She also had infusion recently.  High risk medication use - Orencia 750 mg IV infusions every 28 days (last infusion was on 12/19/20).d/c Simponi Aria IV due to recurrent infections, d/c Enbrel due to lack of insurance,  Primary osteoarthritis of both hands-joint protection muscle strengthening was discussed.  She has some discomfort over the [facility] joint.  Primary osteoarthritis of both knees-she is off-and-on discomfort in her knee joints.  Muscle strengthening exercises and regular walking was emphasized.  Primary osteoarthritis of both feet-she has noticed improvement since she has been wearing proper fitting shoes.  Spondylosis of lumbar region without myelopathy or radiculopathy-she has off-and-on  discomfort in the lower back.  Core strengthening exercises were discussed.  Age-related osteoporosis without current pathological fracture - DEXA done by PCP January 05, 2017 T score -2.8 lumbar, BMD 0.869.  Patient reports intolerance to Fosamax.  She is only on calcium and vitamin D.  I reviewed her 2018 DEXA.  Patient believes that she has had a repeat DEXA scan.  Have advised her to bring it at the next visit for review.  Anxiety-patient had been under a lot of stress as her husband was diagnosed with stage IV lung cancer.  He has been going through chemotherapy.  She states he had good response to the treatment.  History of migraine  History of vertigo  History of gastroesophageal reflux (GERD)  History of anemia  History of IBS  History of kidney stones  History of hyperlipidemia-heart healthy diet was discussed and placed in the AVS.  Educated about COVID-19 virus infection-she has been fully vaccinated against COVID-19.  She also received third dose.  I have advised her to get the booster 5 to 6 months after her last vaccine.  Use of mask, social distancing and hand hygiene was emphasized.  Orders: No orders of the defined types were placed in this encounter.  No orders of the defined types were placed in this encounter.   Follow-Up Instructions: Return in about 5 months (around 05/27/2021) for Rheumatoid arthritis, Osteoarthritis.   Bo Merino, MD  Note - This record has been created using Editor, commissioning.  Chart creation errors have been sought, but may not always  have been located. Such creation errors do not reflect on  the standard of medical care.

## 2020-12-19 ENCOUNTER — Encounter (HOSPITAL_COMMUNITY)
Admission: RE | Admit: 2020-12-19 | Discharge: 2020-12-19 | Disposition: A | Discharge status: Home / Self Care | Payer: Medicare Other | Source: Ambulatory Visit | Attending: Rheumatology | Admitting: Rheumatology

## 2020-12-19 ENCOUNTER — Other Ambulatory Visit: Payer: Self-pay

## 2020-12-19 DIAGNOSIS — M0589 Other rheumatoid arthritis with rheumatoid factor of multiple sites: Secondary | ICD-10-CM | POA: Insufficient documentation

## 2020-12-19 MED ORDER — SODIUM CHLORIDE 0.9 % IV SOLN: Freq: Once | INTRAVENOUS | Status: AC

## 2020-12-19 MED ORDER — SODIUM CHLORIDE 0.9 % IV SOLN
750.0000 mg | Freq: Once | INTRAVENOUS | Status: AC
  Administered 2020-12-19: 750 mg via INTRAVENOUS
  Filled 2020-12-19: qty 30

## 2020-12-27 ENCOUNTER — Other Ambulatory Visit: Payer: Self-pay

## 2020-12-27 ENCOUNTER — Encounter: Payer: Self-pay | Admitting: Rheumatology

## 2020-12-27 ENCOUNTER — Ambulatory Visit (INDEPENDENT_AMBULATORY_CARE_PROVIDER_SITE_OTHER): Payer: Medicare Other | Admitting: Rheumatology

## 2020-12-27 ENCOUNTER — Other Ambulatory Visit: Payer: Self-pay | Admitting: Pharmacist

## 2020-12-27 ENCOUNTER — Telehealth: Payer: Self-pay | Admitting: *Deleted

## 2020-12-27 VITALS — BP 118/59 | HR 77 | Resp 16 | Ht 65.0 in | Wt 172.4 lb

## 2020-12-27 DIAGNOSIS — Z862 Personal history of diseases of the blood and blood-forming organs and certain disorders involving the immune mechanism: Secondary | ICD-10-CM

## 2020-12-27 DIAGNOSIS — M19041 Primary osteoarthritis, right hand: Secondary | ICD-10-CM

## 2020-12-27 DIAGNOSIS — Z8719 Personal history of other diseases of the digestive system: Secondary | ICD-10-CM | POA: Diagnosis not present

## 2020-12-27 DIAGNOSIS — M19071 Primary osteoarthritis, right ankle and foot: Secondary | ICD-10-CM

## 2020-12-27 DIAGNOSIS — Z87898 Personal history of other specified conditions: Secondary | ICD-10-CM | POA: Diagnosis not present

## 2020-12-27 DIAGNOSIS — Z79899 Other long term (current) drug therapy: Secondary | ICD-10-CM

## 2020-12-27 DIAGNOSIS — Z87442 Personal history of urinary calculi: Secondary | ICD-10-CM

## 2020-12-27 DIAGNOSIS — M0579 Rheumatoid arthritis with rheumatoid factor of multiple sites without organ or systems involvement: Secondary | ICD-10-CM | POA: Diagnosis not present

## 2020-12-27 DIAGNOSIS — M19042 Primary osteoarthritis, left hand: Secondary | ICD-10-CM

## 2020-12-27 DIAGNOSIS — M19072 Primary osteoarthritis, left ankle and foot: Secondary | ICD-10-CM

## 2020-12-27 DIAGNOSIS — Z8669 Personal history of other diseases of the nervous system and sense organs: Secondary | ICD-10-CM

## 2020-12-27 DIAGNOSIS — Z8639 Personal history of other endocrine, nutritional and metabolic disease: Secondary | ICD-10-CM

## 2020-12-27 DIAGNOSIS — Z7189 Other specified counseling: Secondary | ICD-10-CM

## 2020-12-27 DIAGNOSIS — M810 Age-related osteoporosis without current pathological fracture: Secondary | ICD-10-CM

## 2020-12-27 DIAGNOSIS — F419 Anxiety disorder, unspecified: Secondary | ICD-10-CM

## 2020-12-27 DIAGNOSIS — M170 Bilateral primary osteoarthritis of knee: Secondary | ICD-10-CM | POA: Diagnosis not present

## 2020-12-27 DIAGNOSIS — M47816 Spondylosis without myelopathy or radiculopathy, lumbar region: Secondary | ICD-10-CM | POA: Diagnosis not present

## 2020-12-27 NOTE — Progress Notes (Signed)
Next infusion scheduled for Orencia IV on 01/16/21 and due for updated orders. Last infusion was on 12/19/20.  Last Visit: 12/27/20 Next Visit: 05/30/21 Labs: CBC/CMP on 11/21/20 were both WNL TB Gold: negative on 02/23/20  Orders placed for Orencia IV 750mg  x 3 doses along with premedication of Tylenol and Benadryl to be administered 30 min before infusion  Standing CBC/CMP orders placed to be drawn with every other infusion. Patient due for TB gold with March 2022 infusion (placed order to be released earliest 02/04/21).  Knox Saliva, PharmD, MPH Clinical Pharmacist (Rheumatology and Pulmonology)  12/27/2020 12:28 PM

## 2020-12-27 NOTE — Patient Instructions (Addendum)
COVID-19 vaccine recommendations:   COVID-19 vaccine is recommended for everyone (unless you are allergic to a vaccine component), even if you are on a medication that suppresses your immune system.   If you are on Orencia IV infusions- time vaccination administration so that the first COVID-19 vaccination will occur four weeks after the infusion and postpone the subsequent infusion by one week.   Do not take Tylenol or any anti-inflammatory medications (NSAIDs) 24 hours prior to the COVID-19 vaccination.   There is no direct evidence about the efficacy of the COVID-19 vaccine in individuals who are on medications that suppress the immune system.   Even if you are fully vaccinated, and you are on any medications that suppress your immune system, please continue to wear a mask, maintain at least six feet social distance and practice hand hygiene.   If you develop a COVID-19 infection, please contact your PCP or our office to determine if you need monoclonal antibody infusion.  The booster vaccine is now available for immunocompromised patients.   Please see the following web sites for updated information.   https://[email or web address]/Portals/0/Files/COVID-19-Vaccination-Patient-Resources.pdf  Heart Disease Prevention   Your inflammatory disease increases your risk of heart disease which includes heart attack, stroke, atrial fibrillation (irregular heartbeats), high blood pressure, heart failure and atherosclerosis (plaque in the arteries).  It is important to reduce your risk by:   . Keep blood pressure, cholesterol, and blood sugar at healthy levels   . Smoking Cessation   . Maintain a healthy weight  o BMI 20-25   . Eat a healthy diet  o Plenty of fresh fruit, vegetables, and whole grains  o Limit saturated fats, foods high in sodium, and added sugars  o DASH and Mediterranean diet   . Increase physical activity  o Recommend moderate physically activity for 150 minutes per  week/ 30 minutes a day for five days a week These can be broken up into three separate ten-minute sessions during the day.   . Reduce Stress  . Meditation, slow breathing exercises, yoga, coloring books  . Dental visits twice a year

## 2020-12-27 NOTE — Telephone Encounter (Signed)
Infusion orders placed for Orencia with CBC/CMP and TB gold. Next infusion scheduled for 01/16/21. Nothing further needed.  Knox Saliva, PharmD, MPH Clinical Pharmacist (Rheumatology and Pulmonology)

## 2020-12-27 NOTE — Telephone Encounter (Signed)
Patient in office for a visit. Patient is on Orencia IV and receives her infusion monthly. Patient is due for labs with her next infusion in February. PEr Dr. Estanislado Pandy please add TB Gold.

## 2021-01-16 ENCOUNTER — Encounter (HOSPITAL_COMMUNITY)
Admission: RE | Admit: 2021-01-16 | Discharge: 2021-01-16 | Disposition: A | Discharge status: Home / Self Care | Payer: Medicare Other | Source: Ambulatory Visit | Attending: Rheumatology | Admitting: Rheumatology

## 2021-01-16 ENCOUNTER — Other Ambulatory Visit: Payer: Self-pay

## 2021-01-16 DIAGNOSIS — Z79899 Other long term (current) drug therapy: Secondary | ICD-10-CM

## 2021-01-16 DIAGNOSIS — M0579 Rheumatoid arthritis with rheumatoid factor of multiple sites without organ or systems involvement: Secondary | ICD-10-CM

## 2021-01-16 DIAGNOSIS — M069 Rheumatoid arthritis, unspecified: Secondary | ICD-10-CM | POA: Diagnosis not present

## 2021-01-16 LAB — COMPREHENSIVE METABOLIC PANEL
ALT: 30 U/L (ref 0–44)
AST: 31 U/L (ref 15–41)
Albumin: 3.9 g/dL (ref 3.5–5.0)
Alkaline Phosphatase: 86 U/L (ref 38–126)
Anion gap: 6 (ref 5–15)
BUN: 21 mg/dL (ref 8–23)
CO2: 27 mmol/L (ref 22–32)
Calcium: 9.5 mg/dL (ref 8.9–10.3)
Chloride: 105 mmol/L (ref 98–111)
Creatinine, Ser: 0.68 mg/dL (ref 0.44–1.00)
GFR, Estimated: >60 mL/min (ref >60)
Glucose, Bld: 93 mg/dL (ref 70–99)
Potassium: 4.5 mmol/L (ref 3.5–5.1)
Sodium: 138 mmol/L (ref 135–145)
Total Bilirubin: 0.5 mg/dL (ref 0.3–1.2)
Total Protein: 6.8 g/dL (ref 6.5–8.1)

## 2021-01-16 LAB — CBC WITH DIFFERENTIAL/PLATELET
Abs Immature Granulocytes: 0.02 10*3/uL (ref 0.00–0.07)
Basophils Absolute: 0 10*3/uL (ref 0.0–0.1)
Basophils Relative: 1 %
Eosinophils Absolute: 0.3 10*3/uL (ref 0.0–0.5)
Eosinophils Relative: 4 %
HCT: 42.5 % (ref 36.0–46.0)
Hemoglobin: 13.2 g/dL (ref 12.0–15.0)
Immature Granulocytes: 0 %
Lymphocytes Relative: 25 %
Lymphs Abs: 1.6 10*3/uL (ref 0.7–4.0)
MCH: 27.4 pg (ref 26.0–34.0)
MCHC: 31.1 g/dL (ref 30.0–36.0)
MCV: 88.4 fL (ref 80.0–100.0)
Monocytes Absolute: 0.7 10*3/uL (ref 0.1–1.0)
Monocytes Relative: 11 %
Neutro Abs: 3.9 10*3/uL (ref 1.7–7.7)
Neutrophils Relative %: 59 %
Platelets: 184 10*3/uL (ref 150–400)
RBC: 4.81 MIL/uL (ref 3.87–5.11)
RDW: 13.2 % (ref 11.5–15.5)
WBC: 6.5 10*3/uL (ref 4.0–10.5)
nRBC: 0 % (ref 0.0–0.2)

## 2021-01-16 MED ORDER — ACETAMINOPHEN 325 MG PO TABS: 650.0000 mg | ORAL_TABLET | ORAL | Status: DC

## 2021-01-16 MED ORDER — SODIUM CHLORIDE 0.9 % IV SOLN
750.0000 mg | INTRAVENOUS | Status: DC
  Administered 2021-01-16: 750 mg via INTRAVENOUS
  Filled 2021-01-16: qty 30

## 2021-01-16 MED ORDER — DIPHENHYDRAMINE HCL 25 MG PO CAPS: 25.0000 mg | ORAL_CAPSULE | ORAL | Status: DC

## 2021-01-16 MED ORDER — SODIUM CHLORIDE 0.9 % IV SOLN: Freq: Once | INTRAVENOUS | Status: AC

## 2021-01-16 NOTE — Progress Notes (Signed)
CBC and CMP WNL

## 2021-02-13 ENCOUNTER — Encounter (HOSPITAL_COMMUNITY): Payer: Self-pay

## 2021-02-13 ENCOUNTER — Encounter (HOSPITAL_COMMUNITY)
Admission: RE | Admit: 2021-02-13 | Discharge: 2021-02-13 | Disposition: A | Discharge status: Home / Self Care | Payer: Medicare Other | Source: Ambulatory Visit | Attending: Rheumatology | Admitting: Rheumatology

## 2021-02-13 ENCOUNTER — Other Ambulatory Visit: Payer: Self-pay

## 2021-02-13 DIAGNOSIS — Z79899 Other long term (current) drug therapy: Secondary | ICD-10-CM | POA: Insufficient documentation

## 2021-02-13 DIAGNOSIS — M0579 Rheumatoid arthritis with rheumatoid factor of multiple sites without organ or systems involvement: Secondary | ICD-10-CM | POA: Insufficient documentation

## 2021-02-13 DIAGNOSIS — Z111 Encounter for screening for respiratory tuberculosis: Secondary | ICD-10-CM | POA: Diagnosis not present

## 2021-02-13 MED ORDER — SODIUM CHLORIDE 0.9 % IV SOLN
750.0000 mg | Freq: Once | INTRAVENOUS | Status: AC
  Administered 2021-02-13: 750 mg via INTRAVENOUS
  Filled 2021-02-13: qty 30

## 2021-02-13 MED ORDER — ACETAMINOPHEN 325 MG PO TABS: 650.0000 mg | ORAL_TABLET | Freq: Once | ORAL | Status: DC

## 2021-02-13 MED ORDER — SODIUM CHLORIDE 0.9 % IV SOLN: Freq: Once | INTRAVENOUS | Status: AC

## 2021-02-13 MED ORDER — DIPHENHYDRAMINE HCL 25 MG PO CAPS: 25.0000 mg | ORAL_CAPSULE | Freq: Once | ORAL | Status: DC

## 2021-02-15 LAB — QUANTIFERON-TB GOLD PLUS: QuantiFERON-TB Gold Plus: NEGATIVE

## 2021-02-15 LAB — QUANTIFERON-TB GOLD PLUS (RQFGPL)
QuantiFERON Mitogen Value: >10 IU/mL
QuantiFERON Nil Value: 0.07 IU/mL
QuantiFERON TB1 Ag Value: 0.08 IU/mL
QuantiFERON TB2 Ag Value: 0.08 IU/mL

## 2021-02-16 NOTE — Progress Notes (Signed)
TB gold is negative.

## 2021-02-26 DIAGNOSIS — Z6827 Body mass index (BMI) 27.0-27.9, adult: Secondary | ICD-10-CM | POA: Diagnosis not present

## 2021-02-26 DIAGNOSIS — I10 Essential (primary) hypertension: Secondary | ICD-10-CM | POA: Diagnosis not present

## 2021-02-26 DIAGNOSIS — Z87891 Personal history of nicotine dependence: Secondary | ICD-10-CM | POA: Diagnosis not present

## 2021-02-26 DIAGNOSIS — M069 Rheumatoid arthritis, unspecified: Secondary | ICD-10-CM | POA: Diagnosis not present

## 2021-02-26 DIAGNOSIS — R911 Solitary pulmonary nodule: Secondary | ICD-10-CM | POA: Diagnosis not present

## 2021-02-26 DIAGNOSIS — J449 Chronic obstructive pulmonary disease, unspecified: Secondary | ICD-10-CM | POA: Diagnosis not present

## 2021-02-26 DIAGNOSIS — Z299 Encounter for prophylactic measures, unspecified: Secondary | ICD-10-CM | POA: Diagnosis not present

## 2021-02-28 DIAGNOSIS — R918 Other nonspecific abnormal finding of lung field: Secondary | ICD-10-CM | POA: Diagnosis not present

## 2021-02-28 DIAGNOSIS — J438 Other emphysema: Secondary | ICD-10-CM | POA: Diagnosis not present

## 2021-02-28 DIAGNOSIS — J479 Bronchiectasis, uncomplicated: Secondary | ICD-10-CM | POA: Diagnosis not present

## 2021-02-28 DIAGNOSIS — I700 Atherosclerosis of aorta: Secondary | ICD-10-CM | POA: Diagnosis not present

## 2021-02-28 DIAGNOSIS — I2510 Atherosclerotic heart disease of native coronary artery without angina pectoris: Secondary | ICD-10-CM | POA: Diagnosis not present

## 2021-02-28 DIAGNOSIS — N200 Calculus of kidney: Secondary | ICD-10-CM | POA: Diagnosis not present

## 2021-02-28 DIAGNOSIS — J984 Other disorders of lung: Secondary | ICD-10-CM | POA: Diagnosis not present

## 2021-02-28 DIAGNOSIS — J439 Emphysema, unspecified: Secondary | ICD-10-CM | POA: Diagnosis not present

## 2021-03-04 ENCOUNTER — Telehealth: Payer: Self-pay

## 2021-03-04 NOTE — Telephone Encounter (Signed)
Patient called stating she was just diagnosed with pneumonia yesterday and prescribed Levaquin 500 mg for 10 days.  Patient states she is scheduled for her infusion on Thursday, 03/13/21.  Patient requested a return call regarding rescheduling the infusion.

## 2021-03-04 NOTE — Telephone Encounter (Signed)
Please advise 

## 2021-03-05 ENCOUNTER — Telehealth: Payer: Self-pay

## 2021-03-05 NOTE — Telephone Encounter (Signed)
I called the patient and advised her to postpone her next Orencia infusion until her symptoms have completely resolved and she has had a repeat chest x-ray showing clearance of the pneumonia.  She plans on following up with Dr. Manuella Ghazi after completing the antibiotics for clearance.  She was advised to notify us if her symptoms persist or worsen.  She will also call us if she has any further questions about getting clearance by Dr. Manuella Ghazi.

## 2021-03-05 NOTE — Telephone Encounter (Signed)
Patient left a voicemail stating she left a message yesterday morning asking that Dr. Estanislado Pandy be given the message that she is being treated for pneumonia with antibiotics.  Patient states she has an Orencia infusion scheduled next Thursday, 03/13/21 and won't finish her antibiotics until 03/12/21.  Patient states she is pretty sure she needs to delay the infusion, but needs instructions on what to do.  Patient states she would appreciate it if someone could return her call today, 03/05/21.

## 2021-03-05 NOTE — Telephone Encounter (Signed)
Please advise 

## 2021-03-13 ENCOUNTER — Encounter (HOSPITAL_COMMUNITY): Payer: Medicare Other

## 2021-03-13 DIAGNOSIS — Z20822 Contact with and (suspected) exposure to covid-19: Secondary | ICD-10-CM | POA: Diagnosis not present

## 2021-03-17 DIAGNOSIS — J189 Pneumonia, unspecified organism: Secondary | ICD-10-CM | POA: Diagnosis not present

## 2021-03-17 DIAGNOSIS — M069 Rheumatoid arthritis, unspecified: Secondary | ICD-10-CM | POA: Diagnosis not present

## 2021-03-17 DIAGNOSIS — E7800 Pure hypercholesterolemia, unspecified: Secondary | ICD-10-CM | POA: Diagnosis not present

## 2021-03-17 DIAGNOSIS — J441 Chronic obstructive pulmonary disease with (acute) exacerbation: Secondary | ICD-10-CM | POA: Diagnosis not present

## 2021-03-17 DIAGNOSIS — Z299 Encounter for prophylactic measures, unspecified: Secondary | ICD-10-CM | POA: Diagnosis not present

## 2021-03-26 DIAGNOSIS — Z8701 Personal history of pneumonia (recurrent): Secondary | ICD-10-CM | POA: Diagnosis not present

## 2021-03-26 DIAGNOSIS — J189 Pneumonia, unspecified organism: Secondary | ICD-10-CM | POA: Diagnosis not present

## 2021-03-26 DIAGNOSIS — R0989 Other specified symptoms and signs involving the circulatory and respiratory systems: Secondary | ICD-10-CM | POA: Diagnosis not present

## 2021-03-27 ENCOUNTER — Encounter (HOSPITAL_COMMUNITY): Payer: Medicare Other

## 2021-04-03 ENCOUNTER — Encounter (HOSPITAL_COMMUNITY): Payer: Medicare Other

## 2021-04-10 ENCOUNTER — Other Ambulatory Visit: Payer: Self-pay

## 2021-04-10 ENCOUNTER — Ambulatory Visit (INDEPENDENT_AMBULATORY_CARE_PROVIDER_SITE_OTHER): Payer: Medicare Other | Admitting: Pulmonary Disease

## 2021-04-10 ENCOUNTER — Encounter: Payer: Self-pay | Admitting: Pulmonary Disease

## 2021-04-10 VITALS — BP 134/82 | HR 91 | Temp 97.6°F | Ht 65.0 in | Wt 174.0 lb

## 2021-04-10 DIAGNOSIS — M069 Rheumatoid arthritis, unspecified: Secondary | ICD-10-CM

## 2021-04-10 DIAGNOSIS — J479 Bronchiectasis, uncomplicated: Secondary | ICD-10-CM

## 2021-04-10 DIAGNOSIS — Z87891 Personal history of nicotine dependence: Secondary | ICD-10-CM

## 2021-04-10 DIAGNOSIS — R911 Solitary pulmonary nodule: Secondary | ICD-10-CM | POA: Diagnosis not present

## 2021-04-10 NOTE — Patient Instructions (Signed)
Thank you for visiting Dr. Valeta Harms at [facility]. Today we recommend the following:  We will get you set up in our lung cancer screening program.   Return in about 1 year (around 04/10/2022) for Dr. Valeta Harms .    Please do your part to reduce the spread of COVID-19.

## 2021-04-10 NOTE — Progress Notes (Signed)
Synopsis: Referred in May 2022 for lung nodule by Monico Blitz, MD  Subjective:   PATIENT ID: Tiffany Pollard GENDER: female DOB: December 29, 1950, MRN: [id number]  Chief Complaint  Patient presents with  . Consult    Pt has had lung cancer screen CTs performed which showed lung nodules. Pt was being followed by PCP but after this, she wanted to have the referral. Pt does have complaints of SOB with exertion.    This is a 70 year old female, former history quit smoking in 2005, husband with stage IV lung cancer.  She also has rheumatoid arthritis on Orencia.  She has had multiple pulmonary nodules in the past.  Initial CT scan was in March 2020.  General right middle lobe small pulmonary nodule less than 4 mm.  It has slowly changed over time.  Is now 6 mm in size.  Has been stable since her 2021 CT scan also in March 2021.  Repeat CT scan at [facility] completed in March 2022 stable 6 mm nodule.  Also has other small pulmonary nodules.  CT scan also reveals areas of bronchial wall thickening and bronchiectasis.  We discussed her rheumatoid arthritis which is well controlled on her current medication regimen.  From a respiratory standpoint she has no complaints today here to establish care for follow-up regarding a pulmonary nodule.   Past Medical History:  Diagnosis Date  . GERD (gastroesophageal reflux disease)   . High blood pressure   . Rheumatoid arthritis(714.0)      No family history on file.   Past Surgical History:  Procedure Laterality Date  . BACK SURGERY    . CERVICAL CONIZATION W/BX    . COLONOSCOPY N/A 12/27/2013   Procedure: COLONOSCOPY;  Surgeon: Rogene Houston, MD;  Location: [facility];  Service: Endoscopy;  Laterality: N/A;  320-moved to 125 Ann to notify pt  . FINGER FRACTURE SURGERY    . foot surgery for a bunion    . NASAL SINUS SURGERY    . TONSILLECTOMY      Social History   Socioeconomic History  . Marital status: Married    Spouse name: Not  on file  . Number of children: Not on file  . Years of education: Not on file  . Highest education level: Not on file  Occupational History  . Not on file  Tobacco Use  . Smoking status: Former Smoker    Packs/day: 1.25    Years: 30.00    Pack years: 37.50    Types: Cigarettes    Quit date: 01/05/2004    Years since quitting: 17.2  . Smokeless tobacco: Never Used  Vaping Use  . Vaping Use: Never used  Substance and Sexual Activity  . Alcohol use: No  . Drug use: No  . Sexual activity: Not on file  Other Topics Concern  . Not on file  Social History Narrative  . Not on file   Social Determinants of Health   Financial Resource Strain: Not on file  Food Insecurity: Not on file  Transportation Needs: Not on file  Physical Activity: Not on file  Stress: Not on file  Social Connections: Not on file  Intimate Partner Violence: Not on file     Allergies  Allergen Reactions  . Tetanus Toxoid Swelling    Reaction at age 72.      Outpatient Medications Prior to Visit  Medication Sig Dispense Refill  . Abatacept (ORENCIA IV)     . acetaminophen (TYLENOL)  325 MG tablet Take 650 mg by mouth as needed.    Jearl Klinefelter ELLIPTA 62.5-25 MCG/INH AEPB daily.    . Ascorbic Acid (VITAMIN C) 1000 MG tablet Take 1,000 mg by mouth daily.    Marland Kitchen aspirin 81 MG EC tablet Take 81 mg by mouth daily. Swallow whole.    Marland Kitchen azelastine (ASTELIN) 0.1 % nasal spray USE 2 SPRAYS TWO TIMES DAILY  0  . Calcium Carb-Cholecalciferol (CALCIUM 1000 + D PO)     . diazepam (VALIUM) 5 MG tablet Take 5 mg by mouth 3 (three) times daily as needed.    . diphenhydrAMINE (BENADRYL) 25 mg capsule Take 25 mg by mouth as needed.    . famotidine (PEPCID) 20 MG tablet daily.    . fish oil-omega-3 fatty acids 1000 MG capsule Take 2 g by mouth daily.    Marland Kitchen levocetirizine (XYZAL) 2.5 MG/5ML solution Take 2.5 mg by mouth daily.    Marland Kitchen lisinopril (PRINIVIL,ZESTRIL) 20 MG tablet Take 20 mg by mouth daily.    . Multiple  Vitamins-Minerals (MULTIVITAMIN PO) Take by mouth.    . VENTOLIN HFA 108 (90 Base) MCG/ACT inhaler USE 2 PUFFS FOUR TIMES DAILY AS NEEDED  0  . VITAMIN E PO Take 1 tablet by mouth daily.    . Cholecalciferol (VITAMIN D PO) Take 1 tablet by mouth daily.     No facility-administered medications prior to visit.    Review of Systems  Constitutional: Negative for chills, fever, malaise/fatigue and weight loss.  HENT: Negative for hearing loss, sore throat and tinnitus.   Eyes: Negative for blurred vision and double vision.  Respiratory: Negative for cough, hemoptysis, sputum production, shortness of breath, wheezing and stridor.   Cardiovascular: Negative for chest pain, palpitations, orthopnea, leg swelling and PND.  Gastrointestinal: Negative for abdominal pain, constipation, diarrhea, heartburn, nausea and vomiting.  Genitourinary: Negative for dysuria, hematuria and urgency.  Musculoskeletal: Negative for joint pain and myalgias.  Skin: Negative for itching and rash.  Neurological: Negative for dizziness, tingling, weakness and headaches.  Endo/Heme/Allergies: Negative for environmental allergies. Does not bruise/bleed easily.  Psychiatric/Behavioral: Negative for depression. The patient is not nervous/anxious and does not have insomnia.   All other systems reviewed and are negative.    Objective:  Physical Exam Vitals reviewed.  Constitutional:      General: She is not in acute distress.    Appearance: She is well-developed.  HENT:     Head: Normocephalic and atraumatic.  Eyes:     General: No scleral icterus.    Conjunctiva/sclera: Conjunctivae normal.     Pupils: Pupils are equal, round, and reactive to light.  Neck:     Vascular: No JVD.     Trachea: No tracheal deviation.  Cardiovascular:     Rate and Rhythm: Normal rate and regular rhythm.     Heart sounds: Normal heart sounds. No murmur heard.   Pulmonary:     Effort: Pulmonary effort is normal. No tachypnea,  accessory muscle usage or respiratory distress.     Breath sounds: Normal breath sounds. No stridor. No wheezing, rhonchi or rales.  Abdominal:     General: Bowel sounds are normal. There is no distension.     Palpations: Abdomen is soft.     Tenderness: There is no abdominal tenderness.  Musculoskeletal:        General: No tenderness.     Cervical back: Neck supple.  Lymphadenopathy:     Cervical: No cervical adenopathy.  Skin:  General: Skin is warm and dry.     Capillary Refill: Capillary refill takes less than 2 seconds.     Findings: No rash.  Neurological:     Mental Status: She is alert and oriented to person, place, and time.  Psychiatric:        Behavior: Behavior normal.      Vitals:   04/10/21 1030  BP: 134/82  Pulse: 91  Temp: 97.6 F (36.4 C)  TempSrc: Temporal  SpO2: 97%  Weight: 174 lb (78.9 kg)  Height: 5\' 5"  (1.651 m)   97% on RA BMI Readings from Last 3 Encounters:  04/10/21 28.96 kg/m  02/13/21 27.79 kg/m  01/16/21 28.69 kg/m   Wt Readings from Last 3 Encounters:  04/10/21 174 lb (78.9 kg)  02/13/21 167 lb (75.8 kg)  01/16/21 172 lb 6.4 oz (78.2 kg)     CBC    Component Value Date/Time   WBC 6.5 01/16/2021 0837   RBC 4.81 01/16/2021 0837   HGB 13.2 01/16/2021 0837   HGB 13.0 06/10/2017 0815   HCT 42.5 01/16/2021 0837   HCT 38.8 06/10/2017 0815   PLT 184 01/16/2021 0837   PLT 176 06/10/2017 0815   MCV 88.4 01/16/2021 0837   MCV 86 06/10/2017 0815   MCH 27.4 01/16/2021 0837   MCHC 31.1 01/16/2021 0837   RDW 13.2 01/16/2021 0837   RDW 14.2 06/10/2017 0815   LYMPHSABS 1.6 01/16/2021 0837   LYMPHSABS 1.4 06/10/2017 0815   MONOABS 0.7 01/16/2021 0837   EOSABS 0.3 01/16/2021 0837   EOSABS 0.4 06/10/2017 0815   BASOSABS 0.0 01/16/2021 0837   BASOSABS 0.0 06/10/2017 0815    Chest Imaging: 02/26/2021: CT chest completed at [facility] Patient brought disc with CT images today for review.  She has a 6 mm right middle lobe pulmonary  nodule evidence of bronchiectasis other small scattered pulmonary nodules. The patient's images have been independently reviewed by me.    Pulmonary Functions Testing Results: No flowsheet data found.  FeNO:   Pathology:   Echocardiogram:   Heart Catheterization:     Assessment & Plan:     ICD-10-CM   1. Lung nodule  R91.1 Ambulatory Referral for Lung Cancer Scre  2. Former smoker  Z87.891   3. Rheumatoid arthritis, involving unspecified site, unspecified whether rheumatoid factor present ([place])  M06.9   4. Bronchiectasis without complication ([place])  N46.2     Discussion:  This is a 70 year old female former smoker quit in 2005.  She has been followed with annual screening CTs since 2020.  She initially had a lung nodule found in 2020.  She has had 3 separate CT scans each year in March 2020, 2021 and 2022.  I have reviewed the CT scans she has a stable right middle lobe pulmonary nodule however due to her smoking history and rheumatoid arthritis I think she would benefit from annual screening.  Plan: We will refer to our lung cancer screening program so she can continue annual low-dose CT screening instead of having a standard high-dose radiation exposure CT. Patient is agreeable to this plan. She also had a question of early bronchial pneumonia which she completed antibiotics for. No indication at this time for her to repeat images she is clinically stable and I think can restart her Orencia for RA management.  Patient to follow-up with [place] in 1 year after CT imaging.   Current Outpatient Medications:  .  Abatacept (ORENCIA IV), , Disp: , Rfl:  .  acetaminophen (TYLENOL) 325 MG tablet, Take 650 mg by mouth as needed., Disp: , Rfl:  .  ANORO ELLIPTA 62.5-25 MCG/INH AEPB, daily., Disp: , Rfl:  .  Ascorbic Acid (VITAMIN C) 1000 MG tablet, Take 1,000 mg by mouth daily., Disp: , Rfl:  .  aspirin 81 MG EC tablet, Take 81 mg by mouth daily. Swallow whole., Disp: , Rfl:  .   azelastine (ASTELIN) 0.1 % nasal spray, USE 2 SPRAYS TWO TIMES DAILY, Disp: , Rfl: 0 .  Calcium Carb-Cholecalciferol (CALCIUM 1000 + D PO), , Disp: , Rfl:  .  diazepam (VALIUM) 5 MG tablet, Take 5 mg by mouth 3 (three) times daily as needed., Disp: , Rfl:  .  diphenhydrAMINE (BENADRYL) 25 mg capsule, Take 25 mg by mouth as needed., Disp: , Rfl:  .  famotidine (PEPCID) 20 MG tablet, daily., Disp: , Rfl:  .  fish oil-omega-3 fatty acids 1000 MG capsule, Take 2 g by mouth daily., Disp: , Rfl:  .  levocetirizine (XYZAL) 2.5 MG/5ML solution, Take 2.5 mg by mouth daily., Disp: , Rfl:  .  lisinopril (PRINIVIL,ZESTRIL) 20 MG tablet, Take 20 mg by mouth daily., Disp: , Rfl:  .  Multiple Vitamins-Minerals (MULTIVITAMIN PO), Take by mouth., Disp: , Rfl:  .  VENTOLIN HFA 108 (90 Base) MCG/ACT inhaler, USE 2 PUFFS FOUR TIMES DAILY AS NEEDED, Disp: , Rfl: 0 .  VITAMIN E PO, Take 1 tablet by mouth daily., Disp: , Rfl:    Garner Nash, DO [facility] 04/10/2021 11:11 AM

## 2021-04-17 ENCOUNTER — Encounter (HOSPITAL_COMMUNITY)
Admission: RE | Admit: 2021-04-17 | Discharge: 2021-04-17 | Disposition: A | Discharge status: Home / Self Care | Payer: Medicare Other | Source: Ambulatory Visit | Attending: Rheumatology | Admitting: Rheumatology

## 2021-04-17 ENCOUNTER — Other Ambulatory Visit: Payer: Self-pay

## 2021-04-17 DIAGNOSIS — Z01812 Encounter for preprocedural laboratory examination: Secondary | ICD-10-CM | POA: Diagnosis present

## 2021-04-17 DIAGNOSIS — Z79899 Other long term (current) drug therapy: Secondary | ICD-10-CM | POA: Diagnosis not present

## 2021-04-17 DIAGNOSIS — M0579 Rheumatoid arthritis with rheumatoid factor of multiple sites without organ or systems involvement: Secondary | ICD-10-CM | POA: Diagnosis not present

## 2021-04-17 LAB — DIFFERENTIAL
Abs Immature Granulocytes: 0.03 10*3/uL (ref 0.00–0.07)
Basophils Absolute: 0 10*3/uL (ref 0.0–0.1)
Basophils Relative: 0 %
Eosinophils Absolute: 0.4 10*3/uL (ref 0.0–0.5)
Eosinophils Relative: 5 %
Immature Granulocytes: 0 %
Lymphocytes Relative: 19 %
Lymphs Abs: 1.4 10*3/uL (ref 0.7–4.0)
Monocytes Absolute: 0.9 10*3/uL (ref 0.1–1.0)
Monocytes Relative: 11 %
Neutro Abs: 5 10*3/uL (ref 1.7–7.7)
Neutrophils Relative %: 65 %

## 2021-04-17 LAB — COMPREHENSIVE METABOLIC PANEL
ALT: 26 U/L (ref 0–44)
AST: 26 U/L (ref 15–41)
Albumin: 3.6 g/dL (ref 3.5–5.0)
Alkaline Phosphatase: 134 U/L — ABNORMAL HIGH (ref 38–126)
Anion gap: 7 (ref 5–15)
BUN: 23 mg/dL (ref 8–23)
CO2: 28 mmol/L (ref 22–32)
Calcium: 9.4 mg/dL (ref 8.9–10.3)
Chloride: 103 mmol/L (ref 98–111)
Creatinine, Ser: 0.76 mg/dL (ref 0.44–1.00)
GFR, Estimated: >60 mL/min (ref >60)
Glucose, Bld: 83 mg/dL (ref 70–99)
Potassium: 4.4 mmol/L (ref 3.5–5.1)
Sodium: 138 mmol/L (ref 135–145)
Total Bilirubin: 0.8 mg/dL (ref 0.3–1.2)
Total Protein: 6.4 g/dL — ABNORMAL LOW (ref 6.5–8.1)

## 2021-04-17 LAB — CBC
HCT: 39.7 % (ref 36.0–46.0)
Hemoglobin: 12.5 g/dL (ref 12.0–15.0)
MCH: 27.8 pg (ref 26.0–34.0)
MCHC: 31.5 g/dL (ref 30.0–36.0)
MCV: 88.4 fL (ref 80.0–100.0)
Platelets: 192 10*3/uL (ref 150–400)
RBC: 4.49 MIL/uL (ref 3.87–5.11)
RDW: 13.6 % (ref 11.5–15.5)
WBC: 7.7 10*3/uL (ref 4.0–10.5)
nRBC: 0 % (ref 0.0–0.2)

## 2021-04-17 MED ORDER — SODIUM CHLORIDE 0.9 % IV SOLN: INTRAVENOUS | Status: DC

## 2021-04-17 MED ORDER — SODIUM CHLORIDE 0.9 % IV SOLN
750.0000 mg | INTRAVENOUS | Status: DC
  Administered 2021-04-17: 750 mg via INTRAVENOUS
  Filled 2021-04-17: qty 30

## 2021-04-21 ENCOUNTER — Other Ambulatory Visit: Payer: Self-pay | Admitting: *Deleted

## 2021-04-21 DIAGNOSIS — Z87891 Personal history of nicotine dependence: Secondary | ICD-10-CM

## 2021-04-24 ENCOUNTER — Telehealth: Payer: Self-pay | Admitting: Pulmonary Disease

## 2021-04-24 NOTE — Telephone Encounter (Signed)
Spoke with pt and advised that per Dr Valeta Harms she will not be due for low dose chest CT until 02/2022 and we will call her closer to that time to get her scheduled. Pt verbalized understanding. Nothing further needed.

## 2021-04-28 DIAGNOSIS — I10 Essential (primary) hypertension: Secondary | ICD-10-CM | POA: Diagnosis not present

## 2021-04-28 DIAGNOSIS — Z299 Encounter for prophylactic measures, unspecified: Secondary | ICD-10-CM | POA: Diagnosis not present

## 2021-04-28 DIAGNOSIS — M810 Age-related osteoporosis without current pathological fracture: Secondary | ICD-10-CM | POA: Diagnosis not present

## 2021-04-29 DIAGNOSIS — Z20822 Contact with and (suspected) exposure to covid-19: Secondary | ICD-10-CM | POA: Diagnosis not present

## 2021-04-30 ENCOUNTER — Other Ambulatory Visit (HOSPITAL_COMMUNITY): Payer: Self-pay | Admitting: Internal Medicine

## 2021-04-30 DIAGNOSIS — L814 Other melanin hyperpigmentation: Secondary | ICD-10-CM | POA: Diagnosis not present

## 2021-04-30 DIAGNOSIS — L821 Other seborrheic keratosis: Secondary | ICD-10-CM | POA: Diagnosis not present

## 2021-04-30 DIAGNOSIS — B079 Viral wart, unspecified: Secondary | ICD-10-CM | POA: Diagnosis not present

## 2021-04-30 DIAGNOSIS — Z1231 Encounter for screening mammogram for malignant neoplasm of breast: Secondary | ICD-10-CM

## 2021-04-30 DIAGNOSIS — I781 Nevus, non-neoplastic: Secondary | ICD-10-CM | POA: Diagnosis not present

## 2021-04-30 DIAGNOSIS — L570 Actinic keratosis: Secondary | ICD-10-CM | POA: Diagnosis not present

## 2021-05-15 ENCOUNTER — Other Ambulatory Visit: Payer: Self-pay

## 2021-05-15 ENCOUNTER — Encounter (HOSPITAL_COMMUNITY)
Admission: RE | Admit: 2021-05-15 | Discharge: 2021-05-15 | Disposition: A | Discharge status: Home / Self Care | Payer: Medicare Other | Source: Ambulatory Visit | Attending: Rheumatology | Admitting: Rheumatology

## 2021-05-15 DIAGNOSIS — M069 Rheumatoid arthritis, unspecified: Secondary | ICD-10-CM | POA: Diagnosis not present

## 2021-05-15 MED ORDER — SODIUM CHLORIDE 0.9 % IV SOLN
750.0000 mg | Freq: Once | INTRAVENOUS | Status: AC
  Administered 2021-05-15: 15:00:00 750 mg via INTRAVENOUS
  Filled 2021-05-15: qty 30

## 2021-05-15 MED ORDER — SODIUM CHLORIDE 0.9 % IV SOLN
Freq: Once | INTRAVENOUS | Status: AC
  Administered 2021-05-15: 14:00:00 250 mL via INTRAVENOUS

## 2021-05-16 NOTE — Progress Notes (Signed)
Office Visit Note  Patient: Tiffany Pollard             Date of Birth: 08/09/1951           MRN: [id number]             PCP: Monico Blitz, MD Referring: Monico Blitz, MD Visit Date: 05/30/2021 Occupation: @GUAROCC @  Subjective:  Medication monitoring   History of Present Illness: Tiffany Pollard is a 70 y.o. female with history of seropositive rheumatoid arthritis, osteoarthritis, and osteoporosis.  She is receiving IV orencia 750 mg infusions every 28 days.  Her most recent infusion was on 05/15/21.  She denies any recent rheumatoid arthritis flares.  She has been under tremendous amount of stress since her husband is currently receiving chemotherapy.  She experiences occasional joint pain and stiffness but feels that her discomfort is due to underlying osteoarthritis.  Last month she was experiencing significant discomfort in the left [facility] joint which has since improved.  She wore the left [facility] joint brace for several days and try to rest her thumb which alleviated her discomfort.  She has occasional discomfort in both feet especially on the dorsal aspect.  She has been trying to walk on a daily basis for exercise.  She denies any recent falls or fractures.  She continues to take a calcium and vitamin D supplement on a daily basis. She denies any recent infections.  She is planning to receive the 2nd covid-19 booster. She has established care with [place] [facility] for monitoring of pulmonary nodules since she is a previous smoker.      Activities of Daily Living:  Patient reports morning stiffness for 0 minutes.   Patient Reports nocturnal pain.  Difficulty dressing/grooming: Denies Difficulty climbing stairs: Reports Difficulty getting out of chair: Reports Difficulty using hands for taps, buttons, cutlery, and/or writing: Reports  Review of Systems  Constitutional:  Positive for fatigue.  HENT:  Negative for mouth sores, mouth dryness and nose dryness.   Eyes:  Positive for dryness.  Negative for pain, itching and visual disturbance.  Respiratory:  Negative for cough, hemoptysis, shortness of breath and difficulty breathing.   Cardiovascular:  Negative for chest pain, palpitations and swelling in legs/feet.  Gastrointestinal:  Negative for abdominal pain, blood in stool, constipation and diarrhea.  Endocrine: Negative for increased urination.  Genitourinary:  Negative for painful urination.  Musculoskeletal:  Positive for joint pain, joint pain, joint swelling and morning stiffness. Negative for myalgias, muscle weakness, muscle tenderness and myalgias.  Skin:  Negative for color change, rash and redness.  Allergic/Immunologic: Negative for susceptible to infections.  Neurological:  Positive for headaches. Negative for dizziness, numbness, memory loss and weakness.  Hematological:  Negative for swollen glands.  Psychiatric/Behavioral:  Positive for sleep disturbance. Negative for confusion.    PMFS History:  Patient Active Problem List   Diagnosis Date Noted   Primary osteoarthritis of both knees 04/30/2017   History of anemia 04/30/2017   History of migraine 04/30/2017   History of vertigo 04/30/2017   History of gastroesophageal reflux (GERD) 04/30/2017   History of kidney stones 04/30/2017   History of IBS 04/30/2017   History of hyperlipidemia 04/30/2017   Hypercalcemia 04/30/2017   Rheumatoid arthritis involving multiple sites with positive rheumatoid factor ([place]) 12/08/2016   High risk medication use 12/08/2016   Primary osteoarthritis of both hands 12/08/2016   Primary osteoarthritis of both feet 12/08/2016   Osteoarthritis of lumbar spine 12/08/2016   Former smoker 12/08/2016  Abdominal pain, chronic, right lower quadrant 12/11/2013    Past Medical History:  Diagnosis Date   GERD (gastroesophageal reflux disease)    High blood pressure    Rheumatoid arthritis(714.0)     History reviewed. No pertinent family history. Past Surgical History:   Procedure Laterality Date   BACK SURGERY     CERVICAL CONIZATION W/BX     COLONOSCOPY N/A 12/27/2013   Procedure: COLONOSCOPY;  Surgeon: Rogene Houston, MD;  Location: [facility];  Service: Endoscopy;  Laterality: N/A;  320-moved to 125 Ann to notify pt   FINGER FRACTURE SURGERY     foot surgery for a bunion     NASAL SINUS SURGERY     TONSILLECTOMY     Social History   Social History Narrative   Not on file   Immunization History  Administered Date(s) Administered   Influenza, High Dose Seasonal PF 08/07/2019, 08/14/2020   Influenza,inj,Quad PF,6+ Mos 08/18/2017   PFIZER(Purple Top)SARS-COV-2 Vaccination 01/12/2020, 02/02/2020, 08/22/2020   Pneumococcal Conjugate-13 07/13/2018     Objective: Vital Signs: BP 132/69 (BP Location: Left Arm, Patient Position: Sitting, Cuff Size: Normal)   Pulse 81   Ht 5\' 5"  (1.651 m)   Wt 172 lb (78 kg)   BMI 28.62 kg/m    Physical Exam Vitals and nursing note reviewed.  Constitutional:      Appearance: She is well-developed.  HENT:     Head: Normocephalic and atraumatic.  Eyes:     Conjunctiva/sclera: Conjunctivae normal.  Pulmonary:     Effort: Pulmonary effort is normal.  Abdominal:     Palpations: Abdomen is soft.  Musculoskeletal:     Cervical back: Normal range of motion.  Skin:    General: Skin is warm and dry.     Capillary Refill: Capillary refill takes less than 2 seconds.  Neurological:     Mental Status: She is alert and oriented to person, place, and time.  Psychiatric:        Behavior: Behavior normal.     Musculoskeletal Exam: C-spine, thoracic spine, lumbar spine good range of motion with no discomfort.  Shoulder joints, elbow joints, wrist joints, knees, DIPs have good range of motion with no synovitis.  Thickening of the left CMC joint.  PIP and DIP thickening consistent with osteoarthritis of both hands.  Complete fist formation bilaterally.  Hip joints have good range of motion with no discomfort.  Knee  joints have good range of motion with no warmth or effusion.  Bilateral knee crepitus noted.  Ankle joints have good range of motion with no tenderness or joint swelling.  No tenderness over MTP joints.  PIP and DIP thickening consistent with osteoarthritis of both feet noted.  Bunion formation noted bilaterally.    CDAI Exam: CDAI Score: 0.4  Patient Global: 2 mm; Provider Global: 2 mm Swollen: 0 ; Tender: 0  Joint Exam 05/30/2021   No joint exam has been documented for this visit   There is currently no information documented on the homunculus. Go to the Rheumatology activity and complete the homunculus joint exam.  Investigation: No additional findings.  Imaging: No results found.  Recent Labs: Lab Results  Component Value Date   WBC 7.7 04/17/2021   HGB 12.5 04/17/2021   PLT 192 04/17/2021   NA 138 04/17/2021   K 4.4 04/17/2021   CL 103 04/17/2021   CO2 28 04/17/2021   GLUCOSE 83 04/17/2021   BUN 23 04/17/2021   CREATININE 0.76 04/17/2021  BILITOT 0.8 04/17/2021   ALKPHOS 134 (H) 04/17/2021   AST 26 04/17/2021   ALT 26 04/17/2021   PROT 6.4 (L) 04/17/2021   ALBUMIN 3.6 04/17/2021   CALCIUM 9.4 04/17/2021   GFRAA >60 08/29/2020   QFTBGOLD Negative 03/11/2017   QFTBGOLDPLUS Negative 02/13/2021    Speciality Comments: Simply Aria IV-recurrent infections, Enbrel-insurance issues  Procedures:  No procedures performed Allergies: Tetanus toxoid   Assessment / Plan:     Visit Diagnoses: Rheumatoid arthritis involving multiple sites with positive rheumatoid factor (HCC) - +RF, +CCP, ANA-: She has no joint tenderness or synovitis on examination today.  She has not had any recent rheumatoid arthritis flares.  She is clinically doing well on Orencia 750 mg IV infusions every 28 days.  Her most recent infusion was on 05/15/21.  She experiences occasional arthralgias and joint stiffness which she attributes to osteoarthritis.  She has occasional left CMC joint pain as well as  tenderness over the dorsal spurs of both feet.  We discussed the use of Voltaren gel, arthritis compression gloves, CMC joint braces, and the importance of wearing proper fitting shoes.  We also discussed the importance of remaining active and walking on a daily basis for exercise.  She will remain on Orencia as prescribed.  She was advised to notify us if she develops increased joint pain or joint swelling.  She will follow-up in the office in 5 months.  High risk medication use - Orencia 750 mg IV infusions every 28 days (last infusion was on 05/15/21). d/c Enbrel due to lack of insurance. D/c Simponi due to recurrent infections.  CBC and CMP updated on 04/17/21.  TB gold negative on 02/13/21.   She has not had any recent infections.  We discussed the importance of postponing Simponi Aria infusions if she develops signs or symptoms of an infection and to resume once infection has completely cleared. We discussed the importance of yearly skin exams while on Simponi Aria. She is planning on receiving the second COVID-19 vaccine booster.  She plans on scheduling around her next infusion as discussed. She was advised to follow-up with her PCP to discuss receiving the Shingrix vaccine and to clarify if she is up-to-date with the pneumonia vaccines.    Primary osteoarthritis of both hands: She has PIP and DIP thickening consistent with osteoarthritis of both hands.  Thickening over the left [facility] joint noted.  No joint tenderness or inflammation was apparent.  She was able to make a complete fist bilaterally.  We discussed the importance of joint protection and muscle strengthening.  We also discussed the use of CMC joint braces and arthritis compression gloves.  Primary osteoarthritis of both knees: She has good range of motion of both knee joints on exam.  No warmth or effusion was noted.  Bilateral knee crepitus noted.  She has been trying to walk on a daily basis for exercise.  We discussed the importance of  lower extremity muscle strengthening.  Primary osteoarthritis of both feet: She has PIP and DIP thickening consistent with osteoarthritis of both feet.  Bunion formation noted bilaterally.  Dorsal spurs noted bilaterally.  We discussed the importance of wearing proper fitting shoes.  Spondylosis of lumbar region without myelopathy or radiculopathy: She has occasional discomfort in her lower back which she attributes to her activity level.  She has no symptoms of radiculopathy at this time.  Age-related osteoporosis without current pathological fracture - DEXA done by PCP January 05, 2017 T score -2.8 lumbar, BMD  0.869.  Updated DEXA on 03/11/2020 BMD measured at [facility] spine L1 1 through L4 0.928 with a T score of -2.1.  She previously had an intolerance to Fosamax.  She has been taking a calcium and vitamin D supplement on a daily basis.  We discussed the importance of performing resistive exercises. She will be due to update DEXA in April 2023.  Other medical conditions are listed as follows:  History of migraine  History of vertigo  History of anemia  History of gastroesophageal reflux (GERD)  History of kidney stones  History of IBS  History of hyperlipidemia  Anxiety  Orders: No orders of the defined types were placed in this encounter.  No orders of the defined types were placed in this encounter.     Follow-Up Instructions: Return in about 5 months (around 10/30/2021) for Rheumatoid arthritis, Osteoarthritis, Osteoporosis.   Ofilia Neas, PA-C  Note - This record has been created using Dragon software.  Chart creation errors have been sought, but may not always  have been located. Such creation errors do not reflect on  the standard of medical care.

## 2021-05-30 ENCOUNTER — Encounter: Payer: Self-pay | Admitting: Physician Assistant

## 2021-05-30 ENCOUNTER — Other Ambulatory Visit: Payer: Self-pay

## 2021-05-30 ENCOUNTER — Ambulatory Visit (INDEPENDENT_AMBULATORY_CARE_PROVIDER_SITE_OTHER): Payer: Medicare Other | Admitting: Physician Assistant

## 2021-05-30 VITALS — BP 132/69 | HR 81 | Ht 65.0 in | Wt 172.0 lb

## 2021-05-30 DIAGNOSIS — Z8639 Personal history of other endocrine, nutritional and metabolic disease: Secondary | ICD-10-CM

## 2021-05-30 DIAGNOSIS — M19042 Primary osteoarthritis, left hand: Secondary | ICD-10-CM

## 2021-05-30 DIAGNOSIS — Z862 Personal history of diseases of the blood and blood-forming organs and certain disorders involving the immune mechanism: Secondary | ICD-10-CM | POA: Diagnosis not present

## 2021-05-30 DIAGNOSIS — M47816 Spondylosis without myelopathy or radiculopathy, lumbar region: Secondary | ICD-10-CM

## 2021-05-30 DIAGNOSIS — M0579 Rheumatoid arthritis with rheumatoid factor of multiple sites without organ or systems involvement: Secondary | ICD-10-CM

## 2021-05-30 DIAGNOSIS — M810 Age-related osteoporosis without current pathological fracture: Secondary | ICD-10-CM | POA: Diagnosis not present

## 2021-05-30 DIAGNOSIS — Z79899 Other long term (current) drug therapy: Secondary | ICD-10-CM

## 2021-05-30 DIAGNOSIS — F419 Anxiety disorder, unspecified: Secondary | ICD-10-CM

## 2021-05-30 DIAGNOSIS — Z87442 Personal history of urinary calculi: Secondary | ICD-10-CM | POA: Diagnosis not present

## 2021-05-30 DIAGNOSIS — Z8719 Personal history of other diseases of the digestive system: Secondary | ICD-10-CM

## 2021-05-30 DIAGNOSIS — Z8669 Personal history of other diseases of the nervous system and sense organs: Secondary | ICD-10-CM

## 2021-05-30 DIAGNOSIS — Z87898 Personal history of other specified conditions: Secondary | ICD-10-CM

## 2021-05-30 DIAGNOSIS — M19071 Primary osteoarthritis, right ankle and foot: Secondary | ICD-10-CM

## 2021-05-30 DIAGNOSIS — M19072 Primary osteoarthritis, left ankle and foot: Secondary | ICD-10-CM

## 2021-05-30 DIAGNOSIS — M170 Bilateral primary osteoarthritis of knee: Secondary | ICD-10-CM | POA: Diagnosis not present

## 2021-05-30 DIAGNOSIS — M19041 Primary osteoarthritis, right hand: Secondary | ICD-10-CM

## 2021-06-12 ENCOUNTER — Encounter (HOSPITAL_COMMUNITY): Payer: Medicare Other

## 2021-06-12 DIAGNOSIS — Z23 Encounter for immunization: Secondary | ICD-10-CM | POA: Diagnosis not present

## 2021-06-19 ENCOUNTER — Encounter (HOSPITAL_COMMUNITY): Payer: Self-pay

## 2021-06-19 ENCOUNTER — Encounter (HOSPITAL_COMMUNITY)
Admission: RE | Admit: 2021-06-19 | Discharge: 2021-06-19 | Disposition: A | Discharge status: Home / Self Care | Payer: Medicare Other | Source: Ambulatory Visit | Attending: Rheumatology | Admitting: Rheumatology

## 2021-06-19 ENCOUNTER — Other Ambulatory Visit: Payer: Self-pay

## 2021-06-19 DIAGNOSIS — M0579 Rheumatoid arthritis with rheumatoid factor of multiple sites without organ or systems involvement: Secondary | ICD-10-CM | POA: Diagnosis not present

## 2021-06-19 LAB — CBC WITH DIFFERENTIAL/PLATELET
Abs Immature Granulocytes: 0.01 10*3/uL (ref 0.00–0.07)
Basophils Absolute: 0.1 10*3/uL (ref 0.0–0.1)
Basophils Relative: 1 %
Eosinophils Absolute: 0.8 10*3/uL — ABNORMAL HIGH (ref 0.0–0.5)
Eosinophils Relative: 12 %
HCT: 40 % (ref 36.0–46.0)
Hemoglobin: 12.6 g/dL (ref 12.0–15.0)
Immature Granulocytes: 0 %
Lymphocytes Relative: 22 %
Lymphs Abs: 1.5 10*3/uL (ref 0.7–4.0)
MCH: 27.6 pg (ref 26.0–34.0)
MCHC: 31.5 g/dL (ref 30.0–36.0)
MCV: 87.5 fL (ref 80.0–100.0)
Monocytes Absolute: 0.7 10*3/uL (ref 0.1–1.0)
Monocytes Relative: 10 %
Neutro Abs: 3.6 10*3/uL (ref 1.7–7.7)
Neutrophils Relative %: 55 %
Platelets: 166 10*3/uL (ref 150–400)
RBC: 4.57 MIL/uL (ref 3.87–5.11)
RDW: 13.7 % (ref 11.5–15.5)
WBC: 6.5 10*3/uL (ref 4.0–10.5)
nRBC: 0 % (ref 0.0–0.2)

## 2021-06-19 LAB — COMPREHENSIVE METABOLIC PANEL
ALT: 24 U/L (ref 0–44)
AST: 29 U/L (ref 15–41)
Albumin: 3.9 g/dL (ref 3.5–5.0)
Alkaline Phosphatase: 111 U/L (ref 38–126)
Anion gap: 7 (ref 5–15)
BUN: 29 mg/dL — ABNORMAL HIGH (ref 8–23)
CO2: 27 mmol/L (ref 22–32)
Calcium: 9.2 mg/dL (ref 8.9–10.3)
Chloride: 105 mmol/L (ref 98–111)
Creatinine, Ser: 0.7 mg/dL (ref 0.44–1.00)
GFR, Estimated: >60 mL/min (ref >60)
Glucose, Bld: 84 mg/dL (ref 70–99)
Potassium: 4.2 mmol/L (ref 3.5–5.1)
Sodium: 139 mmol/L (ref 135–145)
Total Bilirubin: 0.4 mg/dL (ref 0.3–1.2)
Total Protein: 6.4 g/dL — ABNORMAL LOW (ref 6.5–8.1)

## 2021-06-19 MED ORDER — SODIUM CHLORIDE 0.9 % IV SOLN: Freq: Once | INTRAVENOUS | Status: AC

## 2021-06-19 MED ORDER — SODIUM CHLORIDE 0.9 % IV SOLN
750.0000 mg | INTRAVENOUS | Status: AC
  Administered 2021-06-19: 750 mg via INTRAVENOUS
  Filled 2021-06-19: qty 30

## 2021-06-19 NOTE — Progress Notes (Signed)
CBC and CMP are stable.

## 2021-06-20 ENCOUNTER — Ambulatory Visit (HOSPITAL_COMMUNITY)
Admission: RE | Admit: 2021-06-20 | Discharge: 2021-06-20 | Disposition: A | Discharge status: Home / Self Care | Payer: Medicare Other | Source: Ambulatory Visit | Attending: Internal Medicine | Admitting: Internal Medicine

## 2021-06-20 DIAGNOSIS — Z1231 Encounter for screening mammogram for malignant neoplasm of breast: Secondary | ICD-10-CM | POA: Diagnosis not present

## 2021-07-03 DIAGNOSIS — Z20822 Contact with and (suspected) exposure to covid-19: Secondary | ICD-10-CM | POA: Diagnosis not present

## 2021-07-09 ENCOUNTER — Telehealth: Payer: Self-pay | Admitting: Rheumatology

## 2021-07-09 NOTE — Telephone Encounter (Signed)
Forestine Na calling in reference to [facility] orders. They are having trouble receiving orders thru Epic for some reason. An order form was faxed, and they request this to be filled out, and faxed back instead of sending orders in Epic. Written orders are good for 1 year. Patient is scheduled for 07/17/2021.

## 2021-07-10 NOTE — Telephone Encounter (Signed)
Please advise 

## 2021-07-11 ENCOUNTER — Other Ambulatory Visit: Payer: Self-pay | Admitting: Pharmacist

## 2021-07-11 DIAGNOSIS — Z79899 Other long term (current) drug therapy: Secondary | ICD-10-CM

## 2021-07-11 DIAGNOSIS — M0579 Rheumatoid arthritis with rheumatoid factor of multiple sites without organ or systems involvement: Secondary | ICD-10-CM

## 2021-07-11 NOTE — Progress Notes (Signed)
Next infusion scheduled for Orencia IV on 07/17/21 and due for updated orders. Diagnosis: RA  Dose: '750mg'$  every 28 days (appropriate based on last recorded weight of 78kg)  Last Clinic Visit: 05/30/21 Next Clinic Visit: 11/07/21  Last infusion: 06/19/21 Labs: 06/19/21 CBC and CMP stable TB Gold: negative on 02/13/21   Orders placed for Orencia '750mg'$  x 3 doses along with premedication of Tylenol and Benadryl to be administered 30 minutes before medication infusion.  Standing CBC with diff/platelet and CMP with GFR orders placed to be drawn every 2 months.  Next TB gold due 02/13/22  Knox Saliva, PharmD, MPH, BCPS Clinical Pharmacist (Rheumatology and Pulmonology)

## 2021-07-11 NOTE — Telephone Encounter (Signed)
Called twice for Orencia paper order forms but have not received from [facility].  Placed orders for Orencia '750mg'$  every 28 days x 3 doses via Epic since I have been unable to receive paper forms as advised by [facility].  Knox Saliva, PharmD, MPH, BCPS Clinical Pharmacist (Rheumatology and Pulmonology)

## 2021-07-17 ENCOUNTER — Encounter (HOSPITAL_COMMUNITY)
Admission: RE | Admit: 2021-07-17 | Discharge: 2021-07-17 | Disposition: A | Discharge status: Home / Self Care | Payer: Medicare Other | Source: Ambulatory Visit | Attending: Rheumatology | Admitting: Rheumatology

## 2021-07-17 ENCOUNTER — Other Ambulatory Visit: Payer: Self-pay

## 2021-07-17 DIAGNOSIS — M0589 Other rheumatoid arthritis with rheumatoid factor of multiple sites: Secondary | ICD-10-CM | POA: Diagnosis not present

## 2021-07-17 MED ORDER — SODIUM CHLORIDE 0.9 % IV SOLN: INTRAVENOUS | Status: DC

## 2021-07-17 MED ORDER — SODIUM CHLORIDE 0.9 % IV SOLN
750.0000 mg | Freq: Once | INTRAVENOUS | Status: AC
  Administered 2021-07-17: 750 mg via INTRAVENOUS
  Filled 2021-07-17: qty 30

## 2021-08-07 DIAGNOSIS — Z20822 Contact with and (suspected) exposure to covid-19: Secondary | ICD-10-CM | POA: Diagnosis not present

## 2021-08-07 DIAGNOSIS — J449 Chronic obstructive pulmonary disease, unspecified: Secondary | ICD-10-CM | POA: Diagnosis not present

## 2021-08-07 DIAGNOSIS — Z87891 Personal history of nicotine dependence: Secondary | ICD-10-CM | POA: Diagnosis not present

## 2021-08-07 DIAGNOSIS — Z299 Encounter for prophylactic measures, unspecified: Secondary | ICD-10-CM | POA: Diagnosis not present

## 2021-08-07 DIAGNOSIS — Z6828 Body mass index (BMI) 28.0-28.9, adult: Secondary | ICD-10-CM | POA: Diagnosis not present

## 2021-08-07 DIAGNOSIS — J329 Chronic sinusitis, unspecified: Secondary | ICD-10-CM | POA: Diagnosis not present

## 2021-08-07 DIAGNOSIS — M069 Rheumatoid arthritis, unspecified: Secondary | ICD-10-CM | POA: Diagnosis not present

## 2021-08-07 DIAGNOSIS — I10 Essential (primary) hypertension: Secondary | ICD-10-CM | POA: Diagnosis not present

## 2021-08-14 ENCOUNTER — Encounter (HOSPITAL_COMMUNITY)
Admission: RE | Admit: 2021-08-14 | Discharge: 2021-08-14 | Disposition: A | Discharge status: Home / Self Care | Payer: Medicare Other | Source: Ambulatory Visit | Attending: Rheumatology | Admitting: Rheumatology

## 2021-08-14 ENCOUNTER — Other Ambulatory Visit: Payer: Self-pay

## 2021-08-14 DIAGNOSIS — Z5181 Encounter for therapeutic drug level monitoring: Secondary | ICD-10-CM | POA: Diagnosis not present

## 2021-08-14 DIAGNOSIS — Z79899 Other long term (current) drug therapy: Secondary | ICD-10-CM

## 2021-08-14 DIAGNOSIS — M0579 Rheumatoid arthritis with rheumatoid factor of multiple sites without organ or systems involvement: Secondary | ICD-10-CM | POA: Diagnosis not present

## 2021-08-14 LAB — CBC WITH DIFFERENTIAL/PLATELET
Abs Immature Granulocytes: 0.02 10*3/uL (ref 0.00–0.07)
Basophils Absolute: 0.1 10*3/uL (ref 0.0–0.1)
Basophils Relative: 1 %
Eosinophils Absolute: 0.5 10*3/uL (ref 0.0–0.5)
Eosinophils Relative: 6 %
HCT: 41 % (ref 36.0–46.0)
Hemoglobin: 12.7 g/dL (ref 12.0–15.0)
Immature Granulocytes: 0 %
Lymphocytes Relative: 26 %
Lymphs Abs: 2 10*3/uL (ref 0.7–4.0)
MCH: 27.4 pg (ref 26.0–34.0)
MCHC: 31 g/dL (ref 30.0–36.0)
MCV: 88.4 fL (ref 80.0–100.0)
Monocytes Absolute: 0.8 10*3/uL (ref 0.1–1.0)
Monocytes Relative: 10 %
Neutro Abs: 4.4 10*3/uL (ref 1.7–7.7)
Neutrophils Relative %: 57 %
Platelets: 190 10*3/uL (ref 150–400)
RBC: 4.64 MIL/uL (ref 3.87–5.11)
RDW: 14.2 % (ref 11.5–15.5)
WBC: 7.7 10*3/uL (ref 4.0–10.5)
nRBC: 0 % (ref 0.0–0.2)

## 2021-08-14 LAB — COMPREHENSIVE METABOLIC PANEL
ALT: 25 U/L (ref 0–44)
AST: 23 U/L (ref 15–41)
Albumin: 3.8 g/dL (ref 3.5–5.0)
Alkaline Phosphatase: 110 U/L (ref 38–126)
Anion gap: 8 (ref 5–15)
BUN: 31 mg/dL — ABNORMAL HIGH (ref 8–23)
CO2: 29 mmol/L (ref 22–32)
Calcium: 9.4 mg/dL (ref 8.9–10.3)
Chloride: 102 mmol/L (ref 98–111)
Creatinine, Ser: 0.76 mg/dL (ref 0.44–1.00)
GFR, Estimated: >60 mL/min (ref >60)
Glucose, Bld: 91 mg/dL (ref 70–99)
Potassium: 4.6 mmol/L (ref 3.5–5.1)
Sodium: 139 mmol/L (ref 135–145)
Total Bilirubin: 0.5 mg/dL (ref 0.3–1.2)
Total Protein: 6.5 g/dL (ref 6.5–8.1)

## 2021-08-14 MED ORDER — DIPHENHYDRAMINE HCL 25 MG PO CAPS: 25.0000 mg | ORAL_CAPSULE | ORAL | Status: DC

## 2021-08-14 MED ORDER — ACETAMINOPHEN 325 MG PO TABS: 650.0000 mg | ORAL_TABLET | ORAL | Status: DC

## 2021-08-14 MED ORDER — SODIUM CHLORIDE 0.9 % IV SOLN: Freq: Once | INTRAVENOUS | Status: AC

## 2021-08-14 MED ORDER — SODIUM CHLORIDE 0.9 % IV SOLN
750.0000 mg | INTRAVENOUS | Status: DC
  Administered 2021-08-14: 750 mg via INTRAVENOUS
  Filled 2021-08-14: qty 30

## 2021-08-14 NOTE — Progress Notes (Signed)
Pt states she took tylenol 650 mg and benadryl 25 mg po at home before coming to hospital.

## 2021-08-14 NOTE — Progress Notes (Signed)
BUN is elevated. Creatinine and GFR are WNL.  Rest of CMP WNL.  CBC WNL.

## 2021-08-22 DIAGNOSIS — Z23 Encounter for immunization: Secondary | ICD-10-CM | POA: Diagnosis not present

## 2021-09-09 ENCOUNTER — Other Ambulatory Visit: Payer: Self-pay | Admitting: Pharmacist

## 2021-09-09 DIAGNOSIS — M0579 Rheumatoid arthritis with rheumatoid factor of multiple sites without organ or systems involvement: Secondary | ICD-10-CM

## 2021-09-09 DIAGNOSIS — Z79899 Other long term (current) drug therapy: Secondary | ICD-10-CM

## 2021-09-09 NOTE — Progress Notes (Signed)
Next infusion scheduled for Orencia IV on 09/18/21 and due for updated orders. Patient receives at [facility] infusion center. Diagnosis: RA  Dose: 750mg  every 28 days (appropriate based on last recorded weight of 78kg)  Last Clinic Visit: 05/30/21 Next Clinic Visit: 11/07/21  Last infusion: 08/14/21  Labs: CBC and CMP on 08/14/21 wnl TB Gold: negative on 02/13/21   Orders placed for Orencia IV x 3 doses along with premedication of acetaminophen and diphenhydramine to be administered 30 minutes before medication infusion.  Standing CBC with diff/platelet and CMP with GFR orders placed to be drawn every 2 months.  Next TB gold due 02/13/22  Knox Saliva, PharmD, MPH, BCPS Clinical Pharmacist (Rheumatology and Pulmonology)

## 2021-09-11 ENCOUNTER — Encounter (HOSPITAL_COMMUNITY): Admission: RE | Admit: 2021-09-11 | Payer: Medicare Other | Source: Ambulatory Visit

## 2021-09-17 ENCOUNTER — Encounter (HOSPITAL_COMMUNITY)
Admission: RE | Admit: 2021-09-17 | Discharge: 2021-09-17 | Disposition: A | Discharge status: Home / Self Care | Payer: Medicare Other | Source: Ambulatory Visit | Attending: Rheumatology | Admitting: Rheumatology

## 2021-09-17 DIAGNOSIS — M0579 Rheumatoid arthritis with rheumatoid factor of multiple sites without organ or systems involvement: Secondary | ICD-10-CM | POA: Diagnosis not present

## 2021-09-17 MED ORDER — SODIUM CHLORIDE 0.9 % IV SOLN
750.0000 mg | Freq: Once | INTRAVENOUS | Status: AC
  Administered 2021-09-17: 750 mg via INTRAVENOUS
  Filled 2021-09-17: qty 30

## 2021-09-17 MED ORDER — DIPHENHYDRAMINE HCL 25 MG PO CAPS: 50.0000 mg | ORAL_CAPSULE | Freq: Once | ORAL | Status: DC

## 2021-09-17 MED ORDER — ACETAMINOPHEN 325 MG PO TABS: 650.0000 mg | ORAL_TABLET | Freq: Once | ORAL | Status: DC

## 2021-09-17 MED ORDER — SODIUM CHLORIDE 0.9 % IV SOLN: INTRAVENOUS | Status: DC

## 2021-09-18 ENCOUNTER — Encounter (HOSPITAL_COMMUNITY): Admission: RE | Admit: 2021-09-18 | Payer: Medicare Other | Source: Ambulatory Visit

## 2021-10-09 ENCOUNTER — Encounter (HOSPITAL_COMMUNITY): Payer: Medicare Other

## 2021-10-16 ENCOUNTER — Encounter (HOSPITAL_COMMUNITY)
Admission: RE | Admit: 2021-10-16 | Discharge: 2021-10-16 | Disposition: A | Discharge status: Home / Self Care | Payer: Medicare Other | Source: Ambulatory Visit | Attending: Rheumatology | Admitting: Rheumatology

## 2021-10-16 ENCOUNTER — Other Ambulatory Visit: Payer: Self-pay

## 2021-10-16 DIAGNOSIS — M0579 Rheumatoid arthritis with rheumatoid factor of multiple sites without organ or systems involvement: Secondary | ICD-10-CM | POA: Insufficient documentation

## 2021-10-16 DIAGNOSIS — Z79899 Other long term (current) drug therapy: Secondary | ICD-10-CM | POA: Insufficient documentation

## 2021-10-16 LAB — CBC WITH DIFFERENTIAL/PLATELET
Abs Immature Granulocytes: 0.01 10*3/uL (ref 0.00–0.07)
Basophils Absolute: 0.1 10*3/uL (ref 0.0–0.1)
Basophils Relative: 1 %
Eosinophils Absolute: 0.5 10*3/uL (ref 0.0–0.5)
Eosinophils Relative: 6 %
HCT: 39.6 % (ref 36.0–46.0)
Hemoglobin: 12.7 g/dL (ref 12.0–15.0)
Immature Granulocytes: 0 %
Lymphocytes Relative: 25 %
Lymphs Abs: 1.9 10*3/uL (ref 0.7–4.0)
MCH: 28.4 pg (ref 26.0–34.0)
MCHC: 32.1 g/dL (ref 30.0–36.0)
MCV: 88.6 fL (ref 80.0–100.0)
Monocytes Absolute: 0.6 10*3/uL (ref 0.1–1.0)
Monocytes Relative: 8 %
Neutro Abs: 4.6 10*3/uL (ref 1.7–7.7)
Neutrophils Relative %: 60 %
Platelets: 199 10*3/uL (ref 150–400)
RBC: 4.47 MIL/uL (ref 3.87–5.11)
RDW: 13.9 % (ref 11.5–15.5)
WBC: 7.6 10*3/uL (ref 4.0–10.5)
nRBC: 0 % (ref 0.0–0.2)

## 2021-10-16 LAB — COMPREHENSIVE METABOLIC PANEL
ALT: 26 U/L (ref 0–44)
AST: 26 U/L (ref 15–41)
Albumin: 3.9 g/dL (ref 3.5–5.0)
Alkaline Phosphatase: 100 U/L (ref 38–126)
Anion gap: 7 (ref 5–15)
BUN: 26 mg/dL — ABNORMAL HIGH (ref 8–23)
CO2: 27 mmol/L (ref 22–32)
Calcium: 9.5 mg/dL (ref 8.9–10.3)
Chloride: 106 mmol/L (ref 98–111)
Creatinine, Ser: 0.74 mg/dL (ref 0.44–1.00)
GFR, Estimated: >60 mL/min (ref >60)
Glucose, Bld: 89 mg/dL (ref 70–99)
Potassium: 3.7 mmol/L (ref 3.5–5.1)
Sodium: 140 mmol/L (ref 135–145)
Total Bilirubin: 0.4 mg/dL (ref 0.3–1.2)
Total Protein: 6.6 g/dL (ref 6.5–8.1)

## 2021-10-16 MED ORDER — SODIUM CHLORIDE 0.9 % IV SOLN
750.0000 mg | INTRAVENOUS | Status: DC
  Administered 2021-10-16: 750 mg via INTRAVENOUS
  Filled 2021-10-16: qty 30

## 2021-10-16 MED ORDER — SODIUM CHLORIDE 0.9 % IV SOLN: Freq: Once | INTRAVENOUS | Status: AC

## 2021-10-16 MED ORDER — DIPHENHYDRAMINE HCL 25 MG PO CAPS: 25.0000 mg | ORAL_CAPSULE | ORAL | Status: DC

## 2021-10-16 MED ORDER — ACETAMINOPHEN 325 MG PO TABS: 650.0000 mg | ORAL_TABLET | ORAL | Status: DC

## 2021-10-16 NOTE — Progress Notes (Signed)
BUN is slightly elevated. Creatinine is WNL and GFR is WNL.  Rest of CMP WNL.

## 2021-10-16 NOTE — Progress Notes (Signed)
CBC with diff WNL

## 2021-10-31 NOTE — Progress Notes (Signed)
Office Visit Note  Patient: Tiffany Pollard             Date of Birth: [birth year]           MRN: [id number]             PCP: Monico Blitz, MD Referring: Monico Blitz, MD Visit Date: 11/07/2021 Occupation: @GUAROCC @  Subjective:  Medication management   History of Present Illness: DELYNDA Pollard is a 70 y.o. female with a history of rheumatoid arthritis, osteoarthritis, degenerative disc disease and osteoporosis.  She states that she has been taking Orencia infusions on a regular basis.  She had been doing increased activity recently due to Christmas decorations.  She has had some stiffness and discomfort in her hands but no swelling.  She believes her osteoarthritis causes discomfort.  She has been under a lot of stress because of her husband's health as he has a stage IV lung cancer.  She has been his sole caregiver.  Activities of Daily Living:  Patient reports morning stiffness for 1 hour.   Patient Reports nocturnal pain.  Difficulty dressing/grooming: Denies Difficulty climbing stairs: Denies Difficulty getting out of chair: Denies Difficulty using hands for taps, buttons, cutlery, and/or writing: Reports  Review of Systems  Constitutional:  Positive for fatigue. Negative for night sweats, weight gain and weight loss.  HENT:  Positive for mouth dryness. Negative for mouth sores, trouble swallowing, trouble swallowing and nose dryness.   Eyes:  Positive for dryness. Negative for pain, redness and visual disturbance.  Respiratory:  Positive for shortness of breath. Negative for cough and difficulty breathing.   Cardiovascular:  Negative for chest pain, palpitations, hypertension, irregular heartbeat and swelling in legs/feet.  Gastrointestinal:  Negative for blood in stool, constipation and diarrhea.  Endocrine: Positive for heat intolerance. Negative for increased urination.  Genitourinary:  Negative for difficulty urinating and vaginal dryness.  Musculoskeletal:  Positive for  joint pain, joint pain, joint swelling and morning stiffness. Negative for myalgias, muscle weakness, muscle tenderness and myalgias.  Skin:  Negative for color change, rash, hair loss, skin tightness, ulcers and sensitivity to sunlight.  Allergic/Immunologic: Positive for susceptible to infections.  Neurological:  Negative for dizziness, numbness, memory loss, night sweats and weakness.  Hematological:  Negative for bruising/bleeding tendency and swollen glands.  Psychiatric/Behavioral:  Positive for sleep disturbance. Negative for depressed mood. The patient is not nervous/anxious.    PMFS History:  Patient Active Problem List   Diagnosis Date Noted   Primary osteoarthritis of both knees 04/30/2017   History of anemia 04/30/2017   History of migraine 04/30/2017   History of vertigo 04/30/2017   History of gastroesophageal reflux (GERD) 04/30/2017   History of kidney stones 04/30/2017   History of IBS 04/30/2017   History of hyperlipidemia 04/30/2017   Hypercalcemia 04/30/2017   Rheumatoid arthritis involving multiple sites with positive rheumatoid factor ([place]) 12/08/2016   High risk medication use 12/08/2016   Primary osteoarthritis of both hands 12/08/2016   Primary osteoarthritis of both feet 12/08/2016   Osteoarthritis of lumbar spine 12/08/2016   Former smoker 12/08/2016   Abdominal pain, chronic, right lower quadrant 12/11/2013    Past Medical History:  Diagnosis Date   GERD (gastroesophageal reflux disease)    High blood pressure    Rheumatoid arthritis(714.0)     History reviewed. No pertinent family history. Past Surgical History:  Procedure Laterality Date   BACK SURGERY     CERVICAL CONIZATION W/BX     COLONOSCOPY N/A  12/27/2013   Procedure: COLONOSCOPY;  Surgeon: Rogene Houston, MD;  Location: [facility];  Service: Endoscopy;  Laterality: N/A;  320-moved to 125 Ann to notify pt   FINGER FRACTURE SURGERY     foot surgery for a bunion     NASAL SINUS SURGERY      TONSILLECTOMY     Social History   Social History Narrative   Not on file   Immunization History  Administered Date(s) Administered   Influenza, High Dose Seasonal PF 08/07/2019, 08/14/2020   Influenza,inj,Quad PF,6+ Mos 08/18/2017   PFIZER(Purple Top)SARS-COV-2 Vaccination 01/12/2020, 02/02/2020, 08/22/2020   Pneumococcal Conjugate-13 07/13/2018     Objective: Vital Signs: BP 137/67 (BP Location: Left Arm, Patient Position: Sitting, Cuff Size: Normal)   Pulse 79   Resp 16   Ht 5\' 5"  (1.651 m)   Wt 172 lb (78 kg)   BMI 28.62 kg/m    Physical Exam Vitals and nursing note reviewed.  Constitutional:      Appearance: She is well-developed.  HENT:     Head: Normocephalic and atraumatic.  Eyes:     Conjunctiva/sclera: Conjunctivae normal.  Cardiovascular:     Rate and Rhythm: Normal rate and regular rhythm.     Heart sounds: Normal heart sounds.  Pulmonary:     Effort: Pulmonary effort is normal.     Breath sounds: Normal breath sounds.  Abdominal:     General: Bowel sounds are normal.     Palpations: Abdomen is soft.  Musculoskeletal:     Cervical back: Normal range of motion.  Lymphadenopathy:     Cervical: No cervical adenopathy.  Skin:    General: Skin is warm and dry.     Capillary Refill: Capillary refill takes less than 2 seconds.  Neurological:     Mental Status: She is alert and oriented to person, place, and time.  Psychiatric:        Behavior: Behavior normal.     Musculoskeletal Exam: C-spine was in good range of motion.  Shoulder joints, elbow joints, wrist joints with good range of motion.  She had no synovitis on examination.  She had bilateral PIP and DIP thickening.  Hip joints, knee joints, ankles, MTPs with good range of motion with no synovitis.  CDAI Exam: CDAI Score: 0.4  Patient Global: 2 mm; Provider Global: 2 mm Swollen: 0 ; Tender: 0  Joint Exam 11/07/2021   No joint exam has been documented for this visit   There is currently no  information documented on the homunculus. Go to the Rheumatology activity and complete the homunculus joint exam.  Investigation: No additional findings.  Imaging: No results found.  Recent Labs: Lab Results  Component Value Date   WBC 7.6 10/16/2021   HGB 12.7 10/16/2021   PLT 199 10/16/2021   NA 140 10/16/2021   K 3.7 10/16/2021   CL 106 10/16/2021   CO2 27 10/16/2021   GLUCOSE 89 10/16/2021   BUN 26 (H) 10/16/2021   CREATININE 0.74 10/16/2021   BILITOT 0.4 10/16/2021   ALKPHOS 100 10/16/2021   AST 26 10/16/2021   ALT 26 10/16/2021   PROT 6.6 10/16/2021   ALBUMIN 3.9 10/16/2021   CALCIUM 9.5 10/16/2021   GFRAA >60 08/29/2020   QFTBGOLD Negative 03/11/2017   QFTBGOLDPLUS Negative 02/13/2021    Speciality Comments: Simply Aria IV-recurrent infections, Enbrel-insurance issues  Procedures:  No procedures performed Allergies: Tetanus toxoid   Assessment / Plan:     Visit Diagnoses: Rheumatoid arthritis involving  multiple sites with positive rheumatoid factor (HCC) -  +RF, +CCP, ANA-: She had no synovitis on my examination.  She has been experiencing increased discomfort in her hands due to increased activity.  High risk medication use - Orencia 750 mg IV infusions every 28 days (last infusion was on 05/15/21). d/c Enbrel due to lack of insurance. D/c Simponi due to recurrent infections.  She has been tolerating Orencia well.  Labs obtained on October 16, 2021 which were CBC with differential and CMP with GFR within normal limits.  TB gold was negative on February 13, 2021.  Will obtain repeat TB Gold in March 2023.  She has been advised to hold Orencia in case she develops an infection.  She will resume Orencia after the infection resolves.  Updated information regarding immunization was also placed in the AVS.    Primary osteoarthritis of both hands-she has severe osteoarthritis in her bilateral hands.  Joint protection muscle strengthening was discussed.  Primary  osteoarthritis of both knees-she is off-and-on discomfort in her knee joints.  Primary osteoarthritis of both feet-she has ongoing discomfort in her feet.  Proper fitting shoes were discussed.  Spondylosis of lumbar region without myelopathy or radiculopathy-she has ongoing lower back pain.  Core strengthening exercises were discussed.  Age-related osteoporosis without current pathological fracture - DEXA done by PCP January 05, 2017 T score -2.8 lumbar, BMD 0.869.  Updated DEXA on 03/11/2020 BMD measured at [facility] spine L1 1 through L4 0.928 with a T score of -2.  Use of calcium rich diet and vitamin D was discussed.  Postural kyphosis of thoracic region-stretching exercises were emphasized.  History of migraine  History of vertigo  History of anemia  History of gastroesophageal reflux (GERD)  History of kidney stones  History of IBS  History of hyperlipidemia  Anxiety-she has been under a lot of stress due to her husband's health.  Her husband has a stage IV lung cancer.  Former smoker-she has been followed by pulmonologist and had CT scan of her chest in March 2019.  Orders: No orders of the defined types were placed in this encounter.  No orders of the defined types were placed in this encounter.    Follow-Up Instructions: Return in about 5 months (around 04/07/2022) for Rheumatoid arthritis, Osteoarthritis.   Bo Merino, MD  Note - This record has been created using Editor, commissioning.  Chart creation errors have been sought, but may not always  have been located. Such creation errors do not reflect on  the standard of medical care.

## 2021-11-07 ENCOUNTER — Other Ambulatory Visit: Payer: Self-pay

## 2021-11-07 ENCOUNTER — Encounter: Payer: Self-pay | Admitting: Rheumatology

## 2021-11-07 ENCOUNTER — Ambulatory Visit (INDEPENDENT_AMBULATORY_CARE_PROVIDER_SITE_OTHER): Payer: Medicare Other | Admitting: Rheumatology

## 2021-11-07 VITALS — BP 137/67 | HR 79 | Resp 16 | Ht 65.0 in | Wt 172.0 lb

## 2021-11-07 DIAGNOSIS — M0579 Rheumatoid arthritis with rheumatoid factor of multiple sites without organ or systems involvement: Secondary | ICD-10-CM | POA: Diagnosis not present

## 2021-11-07 DIAGNOSIS — M19042 Primary osteoarthritis, left hand: Secondary | ICD-10-CM

## 2021-11-07 DIAGNOSIS — M47816 Spondylosis without myelopathy or radiculopathy, lumbar region: Secondary | ICD-10-CM | POA: Diagnosis not present

## 2021-11-07 DIAGNOSIS — M19041 Primary osteoarthritis, right hand: Secondary | ICD-10-CM

## 2021-11-07 DIAGNOSIS — Z8719 Personal history of other diseases of the digestive system: Secondary | ICD-10-CM

## 2021-11-07 DIAGNOSIS — Z862 Personal history of diseases of the blood and blood-forming organs and certain disorders involving the immune mechanism: Secondary | ICD-10-CM | POA: Diagnosis not present

## 2021-11-07 DIAGNOSIS — Z8669 Personal history of other diseases of the nervous system and sense organs: Secondary | ICD-10-CM | POA: Diagnosis not present

## 2021-11-07 DIAGNOSIS — Z8639 Personal history of other endocrine, nutritional and metabolic disease: Secondary | ICD-10-CM

## 2021-11-07 DIAGNOSIS — Z87891 Personal history of nicotine dependence: Secondary | ICD-10-CM

## 2021-11-07 DIAGNOSIS — M810 Age-related osteoporosis without current pathological fracture: Secondary | ICD-10-CM | POA: Diagnosis not present

## 2021-11-07 DIAGNOSIS — Z79899 Other long term (current) drug therapy: Secondary | ICD-10-CM

## 2021-11-07 DIAGNOSIS — M19072 Primary osteoarthritis, left ankle and foot: Secondary | ICD-10-CM

## 2021-11-07 DIAGNOSIS — M4004 Postural kyphosis, thoracic region: Secondary | ICD-10-CM | POA: Diagnosis not present

## 2021-11-07 DIAGNOSIS — M170 Bilateral primary osteoarthritis of knee: Secondary | ICD-10-CM | POA: Diagnosis not present

## 2021-11-07 DIAGNOSIS — F419 Anxiety disorder, unspecified: Secondary | ICD-10-CM

## 2021-11-07 DIAGNOSIS — M19071 Primary osteoarthritis, right ankle and foot: Secondary | ICD-10-CM

## 2021-11-07 DIAGNOSIS — Z87898 Personal history of other specified conditions: Secondary | ICD-10-CM | POA: Diagnosis not present

## 2021-11-07 DIAGNOSIS — Z87442 Personal history of urinary calculi: Secondary | ICD-10-CM

## 2021-11-07 NOTE — Patient Instructions (Signed)

## 2021-11-12 DIAGNOSIS — Z6828 Body mass index (BMI) 28.0-28.9, adult: Secondary | ICD-10-CM | POA: Diagnosis not present

## 2021-11-12 DIAGNOSIS — Z1339 Encounter for screening examination for other mental health and behavioral disorders: Secondary | ICD-10-CM | POA: Diagnosis not present

## 2021-11-12 DIAGNOSIS — Z7189 Other specified counseling: Secondary | ICD-10-CM | POA: Diagnosis not present

## 2021-11-12 DIAGNOSIS — E7800 Pure hypercholesterolemia, unspecified: Secondary | ICD-10-CM | POA: Diagnosis not present

## 2021-11-12 DIAGNOSIS — Z0000 Encounter for general adult medical examination without abnormal findings: Secondary | ICD-10-CM | POA: Diagnosis not present

## 2021-11-12 DIAGNOSIS — I700 Atherosclerosis of aorta: Secondary | ICD-10-CM | POA: Diagnosis not present

## 2021-11-12 DIAGNOSIS — I10 Essential (primary) hypertension: Secondary | ICD-10-CM | POA: Diagnosis not present

## 2021-11-12 DIAGNOSIS — Z299 Encounter for prophylactic measures, unspecified: Secondary | ICD-10-CM | POA: Diagnosis not present

## 2021-11-12 DIAGNOSIS — Z1331 Encounter for screening for depression: Secondary | ICD-10-CM | POA: Diagnosis not present

## 2021-11-13 ENCOUNTER — Encounter (HOSPITAL_COMMUNITY): Payer: Self-pay

## 2021-11-13 ENCOUNTER — Other Ambulatory Visit: Payer: Self-pay

## 2021-11-13 ENCOUNTER — Encounter (HOSPITAL_COMMUNITY)
Admission: RE | Admit: 2021-11-13 | Discharge: 2021-11-13 | Disposition: A | Discharge status: Home / Self Care | Payer: Medicare Other | Source: Ambulatory Visit | Attending: Rheumatology | Admitting: Rheumatology

## 2021-11-13 DIAGNOSIS — M069 Rheumatoid arthritis, unspecified: Secondary | ICD-10-CM | POA: Insufficient documentation

## 2021-11-13 MED ORDER — SODIUM CHLORIDE 0.9 % IV SOLN
750.0000 mg | Freq: Once | INTRAVENOUS | Status: AC
  Administered 2021-11-13: 750 mg via INTRAVENOUS
  Filled 2021-11-13: qty 30

## 2021-11-13 MED ORDER — ACETAMINOPHEN 325 MG PO TABS: 650.0000 mg | ORAL_TABLET | Freq: Once | ORAL | Status: DC

## 2021-11-13 MED ORDER — DIPHENHYDRAMINE HCL 25 MG PO CAPS: 50.0000 mg | ORAL_CAPSULE | Freq: Once | ORAL | Status: DC

## 2021-11-13 MED ORDER — SODIUM CHLORIDE 0.9 % IV SOLN: Freq: Once | INTRAVENOUS | Status: AC

## 2021-12-11 ENCOUNTER — Encounter (HOSPITAL_COMMUNITY): Payer: Medicare Other

## 2021-12-11 DIAGNOSIS — Z20828 Contact with and (suspected) exposure to other viral communicable diseases: Secondary | ICD-10-CM | POA: Diagnosis not present

## 2021-12-11 DIAGNOSIS — Z23 Encounter for immunization: Secondary | ICD-10-CM | POA: Diagnosis not present

## 2021-12-18 ENCOUNTER — Encounter (HOSPITAL_COMMUNITY): Admission: RE | Admit: 2021-12-18 | Payer: Medicare Other | Source: Ambulatory Visit

## 2021-12-18 DIAGNOSIS — M0579 Rheumatoid arthritis with rheumatoid factor of multiple sites without organ or systems involvement: Secondary | ICD-10-CM

## 2021-12-25 ENCOUNTER — Encounter (HOSPITAL_COMMUNITY)
Admission: RE | Admit: 2021-12-25 | Discharge: 2021-12-25 | Disposition: A | Discharge status: Home / Self Care | Payer: Medicare Other | Source: Ambulatory Visit | Attending: Rheumatology | Admitting: Rheumatology

## 2021-12-25 ENCOUNTER — Encounter (HOSPITAL_COMMUNITY): Payer: Self-pay

## 2021-12-25 ENCOUNTER — Other Ambulatory Visit: Payer: Self-pay | Admitting: Pharmacist

## 2021-12-25 DIAGNOSIS — M0579 Rheumatoid arthritis with rheumatoid factor of multiple sites without organ or systems involvement: Secondary | ICD-10-CM | POA: Diagnosis not present

## 2021-12-25 DIAGNOSIS — Z79899 Other long term (current) drug therapy: Secondary | ICD-10-CM

## 2021-12-25 LAB — CBC WITH DIFFERENTIAL/PLATELET
Abs Immature Granulocytes: 0.02 10*3/uL (ref 0.00–0.07)
Basophils Absolute: 0 10*3/uL (ref 0.0–0.1)
Basophils Relative: 1 %
Eosinophils Absolute: 0.4 10*3/uL (ref 0.0–0.5)
Eosinophils Relative: 6 %
HCT: 41.9 % (ref 36.0–46.0)
Hemoglobin: 12.9 g/dL (ref 12.0–15.0)
Immature Granulocytes: 0 %
Lymphocytes Relative: 24 %
Lymphs Abs: 1.5 10*3/uL (ref 0.7–4.0)
MCH: 26.8 pg (ref 26.0–34.0)
MCHC: 30.8 g/dL (ref 30.0–36.0)
MCV: 86.9 fL (ref 80.0–100.0)
Monocytes Absolute: 0.6 10*3/uL (ref 0.1–1.0)
Monocytes Relative: 10 %
Neutro Abs: 3.8 10*3/uL (ref 1.7–7.7)
Neutrophils Relative %: 59 %
Platelets: 167 10*3/uL (ref 150–400)
RBC: 4.82 MIL/uL (ref 3.87–5.11)
RDW: 13.6 % (ref 11.5–15.5)
WBC: 6.4 10*3/uL (ref 4.0–10.5)
nRBC: 0 % (ref 0.0–0.2)

## 2021-12-25 LAB — COMPREHENSIVE METABOLIC PANEL
ALT: 23 U/L (ref 0–44)
AST: 26 U/L (ref 15–41)
Albumin: 3.8 g/dL (ref 3.5–5.0)
Alkaline Phosphatase: 87 U/L (ref 38–126)
Anion gap: 7 (ref 5–15)
BUN: 25 mg/dL — ABNORMAL HIGH (ref 8–23)
CO2: 29 mmol/L (ref 22–32)
Calcium: 9.6 mg/dL (ref 8.9–10.3)
Chloride: 106 mmol/L (ref 98–111)
Creatinine, Ser: 0.73 mg/dL (ref 0.44–1.00)
GFR, Estimated: >60 mL/min (ref >60)
Glucose, Bld: 84 mg/dL (ref 70–99)
Potassium: 4.2 mmol/L (ref 3.5–5.1)
Sodium: 142 mmol/L (ref 135–145)
Total Bilirubin: 0.7 mg/dL (ref 0.3–1.2)
Total Protein: 6.5 g/dL (ref 6.5–8.1)

## 2021-12-25 MED ORDER — SODIUM CHLORIDE 0.9 % IV SOLN
Freq: Once | INTRAVENOUS | Status: AC
  Administered 2021-12-25: 250 mL via INTRAVENOUS

## 2021-12-25 MED ORDER — SODIUM CHLORIDE 0.9 % IV SOLN
750.0000 mg | Freq: Once | INTRAVENOUS | Status: AC
  Administered 2021-12-25: 750 mg via INTRAVENOUS
  Filled 2021-12-25: qty 30

## 2021-12-25 MED ORDER — DIPHENHYDRAMINE HCL 25 MG PO CAPS: 50.0000 mg | ORAL_CAPSULE | Freq: Once | ORAL | Status: DC

## 2021-12-25 MED ORDER — ACETAMINOPHEN 325 MG PO TABS: 650.0000 mg | ORAL_TABLET | Freq: Once | ORAL | Status: DC

## 2021-12-25 NOTE — Progress Notes (Signed)
CBC and CMP are normal.

## 2021-12-25 NOTE — Progress Notes (Signed)
Next infusion scheduled for Orencia IV today, 12/25/21, and due for updated orders. Diagnosis:  Dose: 750mg  every 28 days (appropriate based on last recorded weight of 78kg)  Last Clinic Visit: 11/07/21 Next Clinic Visit: 04/09/22  Last infusion: 11/13/21  Labs: 10/16/21  - wnl TB Gold: negative on 02/13/21   Orders placed for Orencia IV x 3 doses along with premedication of acetaminophen and diphenhydramine to be administered 30 minutes before medication infusion.  Standing CBC with diff/platelet and CMP with GFR orders placed to be drawn every 2 months.  Next TB gold due March 2023  Knox Saliva, PharmD, MPH, BCPS Clinical Pharmacist (Rheumatology and Pulmonology)

## 2022-01-05 ENCOUNTER — Telehealth: Payer: Self-pay | Admitting: Pharmacy Technician

## 2022-01-05 NOTE — Telephone Encounter (Signed)
Medicare/ AARP supplement:  Medicare covers 80% of the infusion and no authorization is required, and the patients AARP supplement would cover the 20% of the cost that was not paid for by Medicare as long as Medicare covered the medication.   The Supplement also covers the patients Medicare deductible.  Phone# [phone]

## 2022-01-06 DIAGNOSIS — Z01419 Encounter for gynecological examination (general) (routine) without abnormal findings: Secondary | ICD-10-CM | POA: Diagnosis not present

## 2022-01-06 DIAGNOSIS — Z124 Encounter for screening for malignant neoplasm of cervix: Secondary | ICD-10-CM | POA: Diagnosis not present

## 2022-01-15 DIAGNOSIS — Z1211 Encounter for screening for malignant neoplasm of colon: Secondary | ICD-10-CM | POA: Diagnosis not present

## 2022-01-19 ENCOUNTER — Telehealth: Payer: Self-pay | Admitting: Pharmacist

## 2022-01-19 DIAGNOSIS — Z20822 Contact with and (suspected) exposure to covid-19: Secondary | ICD-10-CM | POA: Diagnosis not present

## 2022-01-19 NOTE — Telephone Encounter (Signed)
Faxed Orencia orders to [facility]  Dose: 750mg  IV every 28 days Premeds: acetaminophen 650mg  and diphenhydramine 25mg  orally 30 minutes before infusion Labs: CBC w diff and CMP w GFR every 2 months. TB gold every 12 months  Fax: [phone] Phone: [phone]  Knox Saliva, PharmD, MPH, BCPS Clinical Pharmacist (Rheumatology and Pulmonology)

## 2022-01-22 ENCOUNTER — Encounter (HOSPITAL_COMMUNITY): Payer: Medicare Other

## 2022-01-22 LAB — COLOGUARD: COLOGUARD: NEGATIVE

## 2022-01-27 ENCOUNTER — Encounter (HOSPITAL_COMMUNITY): Payer: Self-pay

## 2022-01-27 ENCOUNTER — Encounter (HOSPITAL_COMMUNITY)
Admission: RE | Admit: 2022-01-27 | Discharge: 2022-01-27 | Disposition: A | Discharge status: Home / Self Care | Payer: Medicare Other | Source: Ambulatory Visit | Attending: Rheumatology | Admitting: Rheumatology

## 2022-01-27 DIAGNOSIS — M069 Rheumatoid arthritis, unspecified: Secondary | ICD-10-CM | POA: Insufficient documentation

## 2022-01-27 MED ORDER — DIPHENHYDRAMINE HCL 25 MG PO CAPS
25.0000 mg | ORAL_CAPSULE | Freq: Once | ORAL | Status: AC
  Administered 2022-01-27: 25 mg via ORAL
  Filled 2022-01-27: qty 1

## 2022-01-27 MED ORDER — SODIUM CHLORIDE 0.9 % IV SOLN
750.0000 mg | Freq: Once | INTRAVENOUS | Status: AC
  Administered 2022-01-27: 750 mg via INTRAVENOUS
  Filled 2022-01-27: qty 30

## 2022-01-27 MED ORDER — SODIUM CHLORIDE 0.9 % IV SOLN: Freq: Once | INTRAVENOUS | Status: AC

## 2022-01-27 MED ORDER — ACETAMINOPHEN 325 MG PO TABS
650.0000 mg | ORAL_TABLET | Freq: Once | ORAL | Status: AC
  Administered 2022-01-27: 650 mg via ORAL
  Filled 2022-01-27: qty 2

## 2022-02-02 DIAGNOSIS — B079 Viral wart, unspecified: Secondary | ICD-10-CM | POA: Diagnosis not present

## 2022-02-23 DIAGNOSIS — B079 Viral wart, unspecified: Secondary | ICD-10-CM | POA: Diagnosis not present

## 2022-02-24 ENCOUNTER — Encounter (HOSPITAL_COMMUNITY)
Admission: RE | Admit: 2022-02-24 | Discharge: 2022-02-24 | Disposition: A | Discharge status: Home / Self Care | Payer: Medicare Other | Source: Ambulatory Visit | Attending: Rheumatology | Admitting: Rheumatology

## 2022-02-24 DIAGNOSIS — M0579 Rheumatoid arthritis with rheumatoid factor of multiple sites without organ or systems involvement: Secondary | ICD-10-CM

## 2022-02-24 DIAGNOSIS — Z79899 Other long term (current) drug therapy: Secondary | ICD-10-CM

## 2022-03-02 ENCOUNTER — Telehealth: Payer: Self-pay | Admitting: Rheumatology

## 2022-03-02 DIAGNOSIS — Z20822 Contact with and (suspected) exposure to covid-19: Secondary | ICD-10-CM | POA: Diagnosis not present

## 2022-03-02 NOTE — Telephone Encounter (Signed)
Patient called the office stating she had been getting her Orencia infusions at [facility] but they are no longer doing the infusions. Patient states she wants to know where we are sending patients for infusions. Patient requested [facility] or somewhere closer to [place]. ?

## 2022-03-03 ENCOUNTER — Other Ambulatory Visit: Payer: Self-pay | Admitting: Pharmacist

## 2022-03-03 DIAGNOSIS — Z79899 Other long term (current) drug therapy: Secondary | ICD-10-CM

## 2022-03-03 DIAGNOSIS — Z111 Encounter for screening for respiratory tuberculosis: Secondary | ICD-10-CM

## 2022-03-03 DIAGNOSIS — M0579 Rheumatoid arthritis with rheumatoid factor of multiple sites without organ or systems involvement: Secondary | ICD-10-CM

## 2022-03-03 NOTE — Progress Notes (Signed)
Next infusion not yet scheduled for Orencia IV and due for updated orders. Patient is transitioning from [facility] infusion [facility] ? ?Diagnosis: RA ? ?Dose: '750mg'$  every 28 days ? ?Last Clinic Visit: 11/07/21 ?Next Clinic Visit: 04/09/22 ? ?Last infusion: 01/27/22 ? ?Labs: CBC and CMP on 12/25/21  wnl ?TB Gold: negative on 02/13/21  ? ?Orders placed for Orencia IV x 3 doses along with premedication of acetaminophen and diphenhydramine to be administered 30 minutes before medication infusion. ? ?Standing CBC with diff/platelet and CMP with GFR orders placed to be drawn every 2 months.  Next TB gold due 02/13/22 ? ?Called patient and provided with phone number for: ?[facility] [phone]) [place] ? ?Will follow-up to ensured scheduled and completed  ? ?Knox Saliva, PharmD, MPH, BCPS ?Clinical Pharmacist (Rheumatology and Pulmonology) ?

## 2022-03-03 NOTE — Telephone Encounter (Signed)
I called patient and advised that [facility] which is closest to [place] does not have infusion center. Called [facility] to confirm if they have infusion center. Advised patient that she could go to [facility] in [place] ([place] ? ?Reached out to Delora Fuel, Land at [employer] for alternatives close to [place], [place]. He states there is nothing [facility] anticipated in the next 3 months. ? ?Patient states she would prefer to come to [facility] for infusions rather than go to [place]. Provided her with Medical Day phone number and advised her to call to schedule ASAP as they are usually booked out for several weeks. 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 ? ?Knox Saliva, PharmD, MPH, BCPS ?Clinical Pharmacist (Rheumatology and Pulmonology) ?

## 2022-03-04 ENCOUNTER — Encounter (HOSPITAL_COMMUNITY)
Admission: RE | Admit: 2022-03-04 | Discharge: 2022-03-04 | Disposition: A | Discharge status: Home / Self Care | Payer: Medicare Other | Source: Ambulatory Visit | Attending: Rheumatology | Admitting: Rheumatology

## 2022-03-04 DIAGNOSIS — M0579 Rheumatoid arthritis with rheumatoid factor of multiple sites without organ or systems involvement: Secondary | ICD-10-CM

## 2022-03-04 DIAGNOSIS — Z79899 Other long term (current) drug therapy: Secondary | ICD-10-CM

## 2022-03-06 DIAGNOSIS — Z20822 Contact with and (suspected) exposure to covid-19: Secondary | ICD-10-CM | POA: Diagnosis not present

## 2022-03-17 ENCOUNTER — Telehealth: Payer: Self-pay

## 2022-03-17 NOTE — Telephone Encounter (Signed)
Okay to schedule virtual appointment.

## 2022-03-17 NOTE — Telephone Encounter (Signed)
Patient called and cancelled her follow-up appointment scheduled for 04/08/22 with Lovena Le.  Patient states her husband has stage 4 cancer and she is not able to get away at this time.  Patient requested a virtual appointment with Dr. Estanislado Pandy.  Patient states she also cancelled her Orencia infusion that was scheduled for 03/23/22.  Patient states she needs to temporarily suspend her Orencia infusions while she is caring for her husband and would like to discuss an alternate medication she can take (that is not too expensive).   ?

## 2022-03-19 NOTE — Progress Notes (Signed)
Virtual Visit via Video Note ? ?I connected with Tiffany Pollard on 03/19/22 at  2:45 PM EDT by a video enabled telemedicine application and verified that I am speaking with the correct person using two identifiers. ? ?Location: ?Patient: Home ?Provider: Office ?  ?I discussed the limitations of evaluation and management by telemedicine and the availability of in person appointments. The patient expressed understanding and agreed to proceed. ? ?This service was conducted via virtual visit.  Both audio and visual tools were used.  The patient was located at home. I was located in my office.  Consent was obtained prior to the virtual visit and is aware of possible charges through their insurance for this visit.  The patient is an established patient.  Dr. Estanislado Pandy, MD conducted the virtual visit.  Office staff helped with scheduling follow up visits after the service was conducted.  ? ? ?CC: Increased joint pain and joint swelling  ?History of Present Illness: Tiffany Pollard is a 71 year old female with history of rheumatoid arthritis, osteoarthritis, degenerative disc disease and osteoporosis.  She is on IV Orencia on a monthly basis.  She has been under a lot of stress as her husband is going through treatment for his stage IV lung cancer.  She is his sole care giver.  Patient states that she could not get Orencia infusions since January 2023 due to the insurance issues.  She has been having a flare of rheumatoid arthritis with pain and swelling in her bilateral hands and her bilateral feet.  She states she has swelling in her ankles.  She is having significant morning stiffness lasting for about 3 to 4 hours.  She has difficulty getting up from the chair and doing routine activities. ? ?Patient reports morning stiffness for 3-4 hours  ?Patient reports nocturnal pain.  ?Difficulty dressing/grooming: Reports ?Difficulty climbing stairs: Reports ?Difficulty getting out of chair: Reports ?Difficulty using hands for  taps, buttons, cutlery, and/or writing: Reports ? ?  ?Review of Systems  ?Constitutional:  Negative for fever and malaise/fatigue.  ?Eyes:  Negative for photophobia, pain, discharge and redness.  ?Respiratory:  Negative for cough, shortness of breath and wheezing.   ?Cardiovascular:  Negative for chest pain and palpitations.  ?Gastrointestinal:  Negative for blood in stool, constipation and diarrhea.  ?Genitourinary:  Negative for dysuria.  ?Musculoskeletal:  Positive for joint pain. Negative for back pain, myalgias and neck pain.  ?     +Joint swelling ?+Morning stiffness   ?Skin:  Negative for rash.  ?Neurological:  Negative for dizziness and headaches.  ?Psychiatric/Behavioral:  Negative for depression. The patient is not nervous/anxious and does not have insomnia.   ? ?Observations/Objective: ?Physical Exam ?HENT:  ?   Head: Normocephalic and atraumatic.  ?Eyes:  ?   Conjunctiva/sclera: Conjunctivae normal.  ?Pulmonary:  ?   Effort: Pulmonary effort is normal.  ?Neurological:  ?   Mental Status: She is alert and oriented to person, place, and time.  ?Psychiatric:     ?   Mood and Affect: Mood and affect normal.     ?   Cognition and Memory: Memory normal.     ?   Judgment: Judgment normal.  ? ? ?Findings:  ?December 25, 2021 CBC normal, CMP normal, TB Gold February 13, 2021 negative ? ?Assessment and Plan: ?Visit Diagnoses: Rheumatoid arthritis involving multiple sites with positive rheumatoid factor (HCC) -  +RF, +CCP, ANA-: She is having a flare of rheumatoid arthritis as she missed her Orencia infusion due to insurance  issues.  She states that the next Orencia infusion is scheduled next week.  She showed me swelling in her wrist joints and her MCP joints.  She also had ankle swelling.  She is having difficulty doing routine activities.  She is also a sole caregiver for her husband which is strenuous on her joints. ?  ?High risk medication use - Orencia 750 mg IV infusions every 28 days (last infusion was on  December 25, 2021). d/c Enbrel due to lack of insurance. D/c Simponi due to recurrent infections.  She has been tolerating Orencia well.  Labs obtained on December 25, 2021 which were CBC with differential and CMP with GFR within normal limits.  Labs were reviewed with the patient.  TB gold was negative on February 13, 2021.  Will obtain repeat TB Gold with her next infusion.  She has been advised to hold Orencia in case she develops an infection.  She will resume Orencia after the infection resolves.     ?  ?Primary osteoarthritis of both hands-she is currently having pain and discomfort due to rheumatoid arthritis and osteoarthritis overlap.  She has severe osteoarthritis which causes discomfort.  Joint protection muscle strengthening was discussed.   ?Primary osteoarthritis of both knees-she is off-and-on discomfort in her knee joints. ?  ?Primary osteoarthritis of both feet-she has been experiencing pain and swelling in her ankles and her feet.  Hopefully with the Orencia infusion her symptoms will improve.  She does not want to take prednisone taper currently. ?  ?Spondylosis of lumbar region without myelopathy or radiculopathy-she denies any back discomfort today.  Core strengthening exercises were emphasized. ?  ?Age-related osteoporosis without current pathological fracture - DEXA done by PCP January 05, 2017 T score -2.8 lumbar, BMD 0.869.  Updated DEXA on 03/11/2020 BMD measured at [facility] spine L1 1 through L4 0.928 with a T score of -2.  Use of calcium rich diet and vitamin D was discussed.  I advised her to schedule repeat DEXA scan with her PCP. ? ?Other medical problems are listed as follows: ?  ?Postural kyphosis of thoracic region-stretching exercises were emphasized. ?  ?History of migraine ?  ?History of vertigo ?  ?History of anemia ?  ?History of gastroesophageal reflux (GERD) ?  ?History of kidney stones ?  ?History of IBS ?  ?History of hyperlipidemia ?  ?Anxiety- Her husband has a stage IV lung cancer.   Her stress level is high. ?  ?Former smoker-she has been followed by pulmonologist and had CT scan of her chest in March 2019. ? ?Follow Up Instructions: ? ?  ?I discussed the assessment and treatment plan with the patient. The patient was provided an opportunity to ask questions and all were answered. The patient agreed with the plan and demonstrated an understanding of the instructions. ?  ?The patient was advised to call back or seek an in-person evaluation if the symptoms worsen or if the condition fails to improve as anticipated. ? ?I provided 20 minutes of non-face-to-face time during this encounter. ? ? ?Bo Merino, MD  ?

## 2022-03-23 ENCOUNTER — Ambulatory Visit (HOSPITAL_COMMUNITY): Payer: Medicare Other

## 2022-03-23 DIAGNOSIS — Z20822 Contact with and (suspected) exposure to covid-19: Secondary | ICD-10-CM | POA: Diagnosis not present

## 2022-03-31 ENCOUNTER — Telehealth (INDEPENDENT_AMBULATORY_CARE_PROVIDER_SITE_OTHER): Payer: Medicare Other | Admitting: Rheumatology

## 2022-03-31 ENCOUNTER — Encounter: Payer: Self-pay | Admitting: Rheumatology

## 2022-03-31 VITALS — Ht 66.0 in

## 2022-03-31 DIAGNOSIS — M19042 Primary osteoarthritis, left hand: Secondary | ICD-10-CM

## 2022-03-31 DIAGNOSIS — M19041 Primary osteoarthritis, right hand: Secondary | ICD-10-CM | POA: Diagnosis not present

## 2022-03-31 DIAGNOSIS — M19072 Primary osteoarthritis, left ankle and foot: Secondary | ICD-10-CM

## 2022-03-31 DIAGNOSIS — Z79899 Other long term (current) drug therapy: Secondary | ICD-10-CM | POA: Diagnosis not present

## 2022-03-31 DIAGNOSIS — M19071 Primary osteoarthritis, right ankle and foot: Secondary | ICD-10-CM | POA: Diagnosis not present

## 2022-03-31 DIAGNOSIS — M47816 Spondylosis without myelopathy or radiculopathy, lumbar region: Secondary | ICD-10-CM | POA: Diagnosis not present

## 2022-03-31 DIAGNOSIS — M0579 Rheumatoid arthritis with rheumatoid factor of multiple sites without organ or systems involvement: Secondary | ICD-10-CM

## 2022-03-31 DIAGNOSIS — M810 Age-related osteoporosis without current pathological fracture: Secondary | ICD-10-CM

## 2022-03-31 DIAGNOSIS — M170 Bilateral primary osteoarthritis of knee: Secondary | ICD-10-CM

## 2022-04-02 DIAGNOSIS — Z20822 Contact with and (suspected) exposure to covid-19: Secondary | ICD-10-CM | POA: Diagnosis not present

## 2022-04-03 ENCOUNTER — Other Ambulatory Visit (HOSPITAL_COMMUNITY): Payer: Self-pay

## 2022-04-06 ENCOUNTER — Inpatient Hospital Stay (HOSPITAL_COMMUNITY): Admission: RE | Admit: 2022-04-06 | Payer: Medicare Other | Source: Ambulatory Visit

## 2022-04-08 ENCOUNTER — Ambulatory Visit: Payer: Medicare Other | Admitting: Physician Assistant

## 2022-04-09 ENCOUNTER — Ambulatory Visit: Payer: Medicare Other | Admitting: Physician Assistant

## 2022-04-13 DIAGNOSIS — Z20822 Contact with and (suspected) exposure to covid-19: Secondary | ICD-10-CM | POA: Diagnosis not present

## 2022-04-17 DIAGNOSIS — J449 Chronic obstructive pulmonary disease, unspecified: Secondary | ICD-10-CM | POA: Diagnosis not present

## 2022-04-17 DIAGNOSIS — Z6827 Body mass index (BMI) 27.0-27.9, adult: Secondary | ICD-10-CM | POA: Diagnosis not present

## 2022-04-17 DIAGNOSIS — R911 Solitary pulmonary nodule: Secondary | ICD-10-CM | POA: Diagnosis not present

## 2022-04-17 DIAGNOSIS — I10 Essential (primary) hypertension: Secondary | ICD-10-CM | POA: Diagnosis not present

## 2022-04-17 DIAGNOSIS — Z299 Encounter for prophylactic measures, unspecified: Secondary | ICD-10-CM | POA: Diagnosis not present

## 2022-04-17 DIAGNOSIS — Z87891 Personal history of nicotine dependence: Secondary | ICD-10-CM | POA: Diagnosis not present

## 2022-04-20 ENCOUNTER — Telehealth: Payer: Self-pay | Admitting: Rheumatology

## 2022-04-20 ENCOUNTER — Ambulatory Visit (HOSPITAL_COMMUNITY)
Admission: RE | Admit: 2022-04-20 | Discharge: 2022-04-20 | Disposition: A | Discharge status: Home / Self Care | Payer: Medicare Other | Source: Ambulatory Visit | Attending: Rheumatology | Admitting: Rheumatology

## 2022-04-20 DIAGNOSIS — Z111 Encounter for screening for respiratory tuberculosis: Secondary | ICD-10-CM | POA: Diagnosis not present

## 2022-04-20 DIAGNOSIS — Z79899 Other long term (current) drug therapy: Secondary | ICD-10-CM | POA: Diagnosis not present

## 2022-04-20 DIAGNOSIS — M0579 Rheumatoid arthritis with rheumatoid factor of multiple sites without organ or systems involvement: Secondary | ICD-10-CM | POA: Diagnosis not present

## 2022-04-20 LAB — COMPREHENSIVE METABOLIC PANEL
ALT: 28 U/L (ref 0–44)
AST: 28 U/L (ref 15–41)
Albumin: 3.5 g/dL (ref 3.5–5.0)
Alkaline Phosphatase: 129 U/L — ABNORMAL HIGH (ref 38–126)
Anion gap: 10 (ref 5–15)
BUN: 20 mg/dL (ref 8–23)
CO2: 24 mmol/L (ref 22–32)
Calcium: 9.4 mg/dL (ref 8.9–10.3)
Chloride: 107 mmol/L (ref 98–111)
Creatinine, Ser: 0.72 mg/dL (ref 0.44–1.00)
GFR, Estimated: >60 mL/min (ref >60)
Glucose, Bld: 88 mg/dL (ref 70–99)
Potassium: 4 mmol/L (ref 3.5–5.1)
Sodium: 141 mmol/L (ref 135–145)
Total Bilirubin: 0.7 mg/dL (ref 0.3–1.2)
Total Protein: 6.2 g/dL — ABNORMAL LOW (ref 6.5–8.1)

## 2022-04-20 LAB — CBC WITH DIFFERENTIAL/PLATELET
Abs Immature Granulocytes: 0.03 10*3/uL (ref 0.00–0.07)
Basophils Absolute: 0.1 10*3/uL (ref 0.0–0.1)
Basophils Relative: 1 %
Eosinophils Absolute: 0.7 10*3/uL — ABNORMAL HIGH (ref 0.0–0.5)
Eosinophils Relative: 8 %
HCT: 39.7 % (ref 36.0–46.0)
Hemoglobin: 12.6 g/dL (ref 12.0–15.0)
Immature Granulocytes: 0 %
Lymphocytes Relative: 18 %
Lymphs Abs: 1.6 10*3/uL (ref 0.7–4.0)
MCH: 27.8 pg (ref 26.0–34.0)
MCHC: 31.7 g/dL (ref 30.0–36.0)
MCV: 87.4 fL (ref 80.0–100.0)
Monocytes Absolute: 0.7 10*3/uL (ref 0.1–1.0)
Monocytes Relative: 7 %
Neutro Abs: 5.9 10*3/uL (ref 1.7–7.7)
Neutrophils Relative %: 66 %
Platelets: 230 10*3/uL (ref 150–400)
RBC: 4.54 MIL/uL (ref 3.87–5.11)
RDW: 13.2 % (ref 11.5–15.5)
WBC: 8.9 10*3/uL (ref 4.0–10.5)
nRBC: 0 % (ref 0.0–0.2)

## 2022-04-20 MED ORDER — ACETAMINOPHEN 325 MG PO TABS
650.0000 mg | ORAL_TABLET | ORAL | Status: DC
  Administered 2022-04-20: 650 mg via ORAL

## 2022-04-20 MED ORDER — SODIUM CHLORIDE 0.9 % IV SOLN
Freq: Once | INTRAVENOUS | Status: DC
Start: 2022-04-20 — End: 2022-04-21

## 2022-04-20 MED ORDER — DIPHENHYDRAMINE HCL 25 MG PO CAPS
25.0000 mg | ORAL_CAPSULE | ORAL | Status: DC
  Administered 2022-04-20: 25 mg via ORAL

## 2022-04-20 MED ORDER — DIPHENHYDRAMINE HCL 25 MG PO CAPS
ORAL_CAPSULE | ORAL | Status: AC
  Filled 2022-04-20: qty 1

## 2022-04-20 MED ORDER — ACETAMINOPHEN 325 MG PO TABS
ORAL_TABLET | ORAL | Status: AC
  Filled 2022-04-20: qty 1

## 2022-04-20 MED ORDER — SODIUM CHLORIDE 0.9 % IV SOLN
750.0000 mg | INTRAVENOUS | Status: DC
  Administered 2022-04-20: 750 mg via INTRAVENOUS
  Filled 2022-04-20: qty 30

## 2022-04-20 NOTE — Telephone Encounter (Signed)
Patient called the office stating she had missed a call from regarding labs and requests a call back. [phone] ?

## 2022-04-20 NOTE — Progress Notes (Signed)
Absolute eosinophils are borderline elevated. Rest of CBC WNL.  Total protein is slightly low-6.2.  Alk pho is borderline elevated.  We will continue to monitor.  Rest of CMP WNL.

## 2022-04-20 NOTE — Telephone Encounter (Signed)
See lab note for details. I returned patient's call.  ?

## 2022-04-24 LAB — QUANTIFERON-TB GOLD PLUS (RQFGPL)
QuantiFERON Mitogen Value: >10 IU/mL
QuantiFERON Nil Value: 0.02 IU/mL
QuantiFERON TB1 Ag Value: 0.13 IU/mL
QuantiFERON TB2 Ag Value: 0.01 IU/mL

## 2022-04-24 LAB — QUANTIFERON-TB GOLD PLUS: QuantiFERON-TB Gold Plus: NEGATIVE

## 2022-04-24 NOTE — Progress Notes (Signed)
TB Gold negative

## 2022-04-28 DIAGNOSIS — R918 Other nonspecific abnormal finding of lung field: Secondary | ICD-10-CM | POA: Diagnosis not present

## 2022-04-28 DIAGNOSIS — J9809 Other diseases of bronchus, not elsewhere classified: Secondary | ICD-10-CM | POA: Diagnosis not present

## 2022-04-28 DIAGNOSIS — R9389 Abnormal findings on diagnostic imaging of other specified body structures: Secondary | ICD-10-CM | POA: Diagnosis not present

## 2022-04-28 DIAGNOSIS — J479 Bronchiectasis, uncomplicated: Secondary | ICD-10-CM | POA: Diagnosis not present

## 2022-05-05 ENCOUNTER — Ambulatory Visit (HOSPITAL_COMMUNITY): Payer: Medicare Other

## 2022-05-13 ENCOUNTER — Other Ambulatory Visit (HOSPITAL_COMMUNITY): Payer: Self-pay | Admitting: Internal Medicine

## 2022-05-13 DIAGNOSIS — Z1231 Encounter for screening mammogram for malignant neoplasm of breast: Secondary | ICD-10-CM

## 2022-05-15 ENCOUNTER — Encounter (HOSPITAL_COMMUNITY): Payer: Medicare Other

## 2022-05-15 ENCOUNTER — Other Ambulatory Visit (HOSPITAL_COMMUNITY): Payer: Self-pay | Admitting: *Deleted

## 2022-05-15 DIAGNOSIS — M0579 Rheumatoid arthritis with rheumatoid factor of multiple sites without organ or systems involvement: Secondary | ICD-10-CM

## 2022-05-18 ENCOUNTER — Ambulatory Visit (HOSPITAL_COMMUNITY)
Admission: RE | Admit: 2022-05-18 | Discharge: 2022-05-18 | Disposition: A | Discharge status: Home / Self Care | Payer: Medicare Other | Source: Ambulatory Visit | Attending: Rheumatology | Admitting: Rheumatology

## 2022-05-18 DIAGNOSIS — M0579 Rheumatoid arthritis with rheumatoid factor of multiple sites without organ or systems involvement: Secondary | ICD-10-CM | POA: Diagnosis not present

## 2022-05-18 MED ORDER — SODIUM CHLORIDE 0.9 % IV SOLN
750.0000 mg | INTRAVENOUS | Status: DC
  Administered 2022-05-18: 750 mg via INTRAVENOUS
  Filled 2022-05-18: qty 30

## 2022-05-18 MED ORDER — DIPHENHYDRAMINE HCL 25 MG PO CAPS: 25.0000 mg | ORAL_CAPSULE | ORAL | Status: DC

## 2022-05-18 MED ORDER — ACETAMINOPHEN 325 MG PO TABS: 650.0000 mg | ORAL_TABLET | ORAL | Status: DC

## 2022-06-15 ENCOUNTER — Other Ambulatory Visit: Payer: Self-pay | Admitting: Pharmacist

## 2022-06-15 ENCOUNTER — Ambulatory Visit (HOSPITAL_COMMUNITY)
Admission: RE | Admit: 2022-06-15 | Discharge: 2022-06-15 | Disposition: A | Discharge status: Home / Self Care | Payer: Medicare Other | Source: Ambulatory Visit | Attending: Rheumatology | Admitting: Rheumatology

## 2022-06-15 DIAGNOSIS — M0579 Rheumatoid arthritis with rheumatoid factor of multiple sites without organ or systems involvement: Secondary | ICD-10-CM | POA: Diagnosis not present

## 2022-06-15 DIAGNOSIS — Z79899 Other long term (current) drug therapy: Secondary | ICD-10-CM

## 2022-06-15 LAB — CBC WITH DIFFERENTIAL/PLATELET
Abs Immature Granulocytes: 0.02 10*3/uL (ref 0.00–0.07)
Basophils Absolute: 0.1 10*3/uL (ref 0.0–0.1)
Basophils Relative: 1 %
Eosinophils Absolute: 0.4 10*3/uL (ref 0.0–0.5)
Eosinophils Relative: 5 %
HCT: 41.9 % (ref 36.0–46.0)
Hemoglobin: 13.2 g/dL (ref 12.0–15.0)
Immature Granulocytes: 0 %
Lymphocytes Relative: 23 %
Lymphs Abs: 1.9 10*3/uL (ref 0.7–4.0)
MCH: 27.7 pg (ref 26.0–34.0)
MCHC: 31.5 g/dL (ref 30.0–36.0)
MCV: 88 fL (ref 80.0–100.0)
Monocytes Absolute: 0.5 10*3/uL (ref 0.1–1.0)
Monocytes Relative: 7 %
Neutro Abs: 5.4 10*3/uL (ref 1.7–7.7)
Neutrophils Relative %: 64 %
Platelets: 190 10*3/uL (ref 150–400)
RBC: 4.76 MIL/uL (ref 3.87–5.11)
RDW: 13.6 % (ref 11.5–15.5)
WBC: 8.3 10*3/uL (ref 4.0–10.5)
nRBC: 0 % (ref 0.0–0.2)

## 2022-06-15 LAB — COMPREHENSIVE METABOLIC PANEL
ALT: 26 U/L (ref 0–44)
AST: 27 U/L (ref 15–41)
Albumin: 3.9 g/dL (ref 3.5–5.0)
Alkaline Phosphatase: 124 U/L (ref 38–126)
Anion gap: 7 (ref 5–15)
BUN: 21 mg/dL (ref 8–23)
CO2: 25 mmol/L (ref 22–32)
Calcium: 9.3 mg/dL (ref 8.9–10.3)
Chloride: 108 mmol/L (ref 98–111)
Creatinine, Ser: 0.83 mg/dL (ref 0.44–1.00)
GFR, Estimated: >60 mL/min (ref >60)
Glucose, Bld: 106 mg/dL — ABNORMAL HIGH (ref 70–99)
Potassium: 4.3 mmol/L (ref 3.5–5.1)
Sodium: 140 mmol/L (ref 135–145)
Total Bilirubin: 0.4 mg/dL (ref 0.3–1.2)
Total Protein: 6.3 g/dL — ABNORMAL LOW (ref 6.5–8.1)

## 2022-06-15 MED ORDER — ACETAMINOPHEN 325 MG PO TABS: 650.0000 mg | ORAL_TABLET | ORAL | Status: DC

## 2022-06-15 MED ORDER — DIPHENHYDRAMINE HCL 25 MG PO CAPS: 25.0000 mg | ORAL_CAPSULE | ORAL | Status: DC

## 2022-06-15 MED ORDER — SODIUM CHLORIDE 0.9 % IV SOLN
750.0000 mg | INTRAVENOUS | Status: DC
  Administered 2022-06-15: 750 mg via INTRAVENOUS
  Filled 2022-06-15: qty 30

## 2022-06-15 NOTE — Progress Notes (Signed)
Next infusion scheduled for Orencia IV on 07/13/22 and due for updated orders. Diagnosis: RA  Dose: '750mg'$  every 28 days (appropriate based on last recorded weight of 74.8kg)  Last Clinic Visit: 11/07/21 Next Clinic Visit: 07/10/22  Last infusion: 06/15/22  Labs: CBC and CMP on 06/15/22 - CBC wnl, CMP stable TB Gold: negative on 04/20/22   Orders placed for Orencia IV x 3 doses along with premedication of acetaminophen and diphenhydramine to be administered 30 minutes before medication infusion.  Standing CBC with diff/platelet and CMP with GFR orders placed to be drawn every 2 months.  Next TB gold due 04/21/23  Knox Saliva, PharmD, MPH, BCPS, CPP Clinical Pharmacist (Rheumatology and Pulmonology)

## 2022-06-15 NOTE — Progress Notes (Signed)
CBC and CMP are stable.

## 2022-06-25 ENCOUNTER — Ambulatory Visit (HOSPITAL_COMMUNITY)
Admission: RE | Admit: 2022-06-25 | Discharge: 2022-06-25 | Disposition: A | Discharge status: Home / Self Care | Payer: Medicare Other | Source: Ambulatory Visit | Attending: Internal Medicine | Admitting: Internal Medicine

## 2022-06-25 DIAGNOSIS — Z1231 Encounter for screening mammogram for malignant neoplasm of breast: Secondary | ICD-10-CM | POA: Diagnosis not present
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
## 2022-07-10 ENCOUNTER — Encounter: Payer: Self-pay | Admitting: Rheumatology

## 2022-07-10 ENCOUNTER — Ambulatory Visit: Payer: Medicare Other | Attending: Rheumatology | Admitting: Rheumatology

## 2022-07-10 VITALS — BP 136/81 | HR 89 | Resp 16 | Ht 65.0 in | Wt 172.0 lb

## 2022-07-10 DIAGNOSIS — Z87442 Personal history of urinary calculi: Secondary | ICD-10-CM

## 2022-07-10 DIAGNOSIS — M542 Cervicalgia: Secondary | ICD-10-CM

## 2022-07-10 DIAGNOSIS — M810 Age-related osteoporosis without current pathological fracture: Secondary | ICD-10-CM

## 2022-07-10 DIAGNOSIS — M19072 Primary osteoarthritis, left ankle and foot: Secondary | ICD-10-CM

## 2022-07-10 DIAGNOSIS — M19041 Primary osteoarthritis, right hand: Secondary | ICD-10-CM

## 2022-07-10 DIAGNOSIS — M4004 Postural kyphosis, thoracic region: Secondary | ICD-10-CM

## 2022-07-10 DIAGNOSIS — M0579 Rheumatoid arthritis with rheumatoid factor of multiple sites without organ or systems involvement: Secondary | ICD-10-CM | POA: Diagnosis not present

## 2022-07-10 DIAGNOSIS — M19071 Primary osteoarthritis, right ankle and foot: Secondary | ICD-10-CM

## 2022-07-10 DIAGNOSIS — M170 Bilateral primary osteoarthritis of knee: Secondary | ICD-10-CM

## 2022-07-10 DIAGNOSIS — M19042 Primary osteoarthritis, left hand: Secondary | ICD-10-CM

## 2022-07-10 DIAGNOSIS — Z79899 Other long term (current) drug therapy: Secondary | ICD-10-CM

## 2022-07-10 DIAGNOSIS — Z8669 Personal history of other diseases of the nervous system and sense organs: Secondary | ICD-10-CM | POA: Diagnosis not present

## 2022-07-10 DIAGNOSIS — F419 Anxiety disorder, unspecified: Secondary | ICD-10-CM

## 2022-07-10 DIAGNOSIS — M47816 Spondylosis without myelopathy or radiculopathy, lumbar region: Secondary | ICD-10-CM

## 2022-07-10 DIAGNOSIS — Z8639 Personal history of other endocrine, nutritional and metabolic disease: Secondary | ICD-10-CM | POA: Diagnosis not present

## 2022-07-10 DIAGNOSIS — Z862 Personal history of diseases of the blood and blood-forming organs and certain disorders involving the immune mechanism: Secondary | ICD-10-CM

## 2022-07-10 DIAGNOSIS — Z87898 Personal history of other specified conditions: Secondary | ICD-10-CM | POA: Diagnosis not present

## 2022-07-10 DIAGNOSIS — Z87891 Personal history of nicotine dependence: Secondary | ICD-10-CM | POA: Diagnosis not present

## 2022-07-10 DIAGNOSIS — Z8719 Personal history of other diseases of the digestive system: Secondary | ICD-10-CM

## 2022-07-10 NOTE — Patient Instructions (Addendum)
Vaccines You are taking a medication(s) that can suppress your immune system.  The following immunizations are recommended: Flu annually Covid-19  Td/Tdap (tetanus, diphtheria, pertussis) every 10 years Pneumonia (Prevnar 15 then Pneumovax 23 at least 1 year apart.  Alternatively, can take Prevnar 20 without needing additional dose) Shingrix: 2 doses from 4 weeks to 6 months apart  Please check with your PCP to make sure you are up to date.   COVID-19 vaccine recommendations:   COVID-19 vaccine is recommended for everyone (unless you are allergic to a vaccine component), even if you are on a medication that suppresses your immune system.    If you are on Orencia IV infusions- time vaccination administration so that the first COVID-19 vaccination will occur four weeks after the infusion and postpone the subsequent infusion by one week.   Do not take Tylenol or any anti-inflammatory medications (NSAIDs) 24 hours prior to the COVID-19 vaccination.   There is no direct evidence about the efficacy of the COVID-19 vaccine in individuals who are on medications that suppress the immune system.   Even if you are fully vaccinated, and you are on any medications that suppress your immune system, please continue to wear a mask, maintain at least six feet social distance and practice hand hygiene.   If you develop a COVID-19 infection, please contact your PCP   Please see the following web sites for updated information.   [email or web address]  If you have signs or symptoms of an infection or start antibiotics: First, call your PCP for workup of your infection. Hold your medication through the infection, until you complete your antibiotics, and until symptoms resolve if you take the following: Injectable medication (Actemra, Benlysta, Cimzia, Cosentyx, Enbrel, Humira, Kevzara, Orencia, Remicade, Simponi, Stelara, Taltz,  Tremfya) Methotrexate Leflunomide (Arava) Mycophenolate (Cellcept) Roma Kayser, or Rinvoq   Cervical Strain and Sprain Rehab Ask your health care provider which exercises are safe for you. Do exercises exactly as told by your health care provider and adjust them as directed. It is normal to feel mild stretching, pulling, tightness, or discomfort as you do these exercises. Stop right away if you feel sudden pain or your pain gets worse. Do not begin these exercises until told by your health care provider. Stretching and range-of-motion exercises Cervical side bending  Using good posture, sit on a stable chair or stand up. Without moving your shoulders, slowly tilt your left / right ear to your shoulder until you feel a stretch in the opposite side neck muscles. You should be looking straight ahead. Hold for __________ seconds. Repeat with the other side of your neck. Repeat __________ times. Complete this exercise __________ times a day. Cervical rotation  Using good posture, sit on a stable chair or stand up. Slowly turn your head to the side as if you are looking over your left / right shoulder. Keep your eyes level with the ground. Stop when you feel a stretch along the side and the back of your neck. Hold for __________ seconds. Repeat this by turning to your other side. Repeat __________ times. Complete this exercise __________ times a day. Thoracic extension and pectoral stretch  Roll a towel or a small blanket so it is about 4 inches (10 cm) in diameter. Lie down on your back on a firm surface. Put the towel in the middle of your back across your spine. It should not be under your shoulder blades. Put your hands behind your head and let your elbows fall out to  your sides. Hold for __________ seconds. Repeat __________ times. Complete this exercise __________ times a day. Strengthening exercises Isometric upper cervical flexion  Lie on your back with a thin pillow  behind your head and a small rolled-up towel under your neck. Gently tuck your chin toward your chest and nod your head down to look toward your feet. Do not lift your head off the pillow. Hold for __________ seconds. Release the tension slowly. Relax your neck muscles completely before you repeat this exercise. Repeat __________ times. Complete this exercise __________ times a day. Isometric cervical extension  Stand about 6 inches (15 cm) away from a wall, with your back facing the wall. Place a soft object, about 6-8 inches (15-20 cm) in diameter, between the back of your head and the wall. A soft object could be a small pillow, a ball, or a folded towel. Gently tilt your head back and press into the soft object. Keep your jaw and forehead relaxed. Hold for __________ seconds. Release the tension slowly. Relax your neck muscles completely before you repeat this exercise. Repeat __________ times. Complete this exercise __________ times a day. Posture and body mechanics Body mechanics refers to the movements and positions of your body while you do your daily activities. Posture is part of body mechanics. Good posture and healthy body mechanics can help to relieve stress in your body's tissues and joints. Good posture means that your spine is in its natural S-curve position (your spine is neutral), your shoulders are pulled back slightly, and your head is not tipped forward. The following are general guidelines for applying improved posture and body mechanics to your everyday activities. Sitting  When sitting, keep your spine neutral and keep your feet flat on the floor. Use a footrest, if necessary, and keep your thighs parallel to the floor. Avoid rounding your shoulders, and avoid tilting your head forward. When working at a desk or a computer, keep your desk at a height where your hands are slightly lower than your elbows. Slide your chair under your desk so you are close enough to maintain  good posture. When working at a computer, place your monitor at a height where you are looking straight ahead and you do not have to tilt your head forward or downward to look at the screen. Standing  When standing, keep your spine neutral and keep your feet about hip-width apart. Keep a slight bend in your knees. Your ears, shoulders, and hips should line up. When you do a task in which you stand in one place for a long time, place one foot up on a stable object that is 2-4 inches (5-10 cm) high, such as a footstool. This helps keep your spine neutral. Resting When lying down and resting, avoid positions that are most painful for you. Try to support your neck in a neutral position. You can use a contour pillow or a small rolled-up towel. Your pillow should support your neck but not push on it. This information is not intended to replace advice given to you by your health care provider. Make sure you discuss any questions you have with your health care provider. Document Revised: 10/13/2021 Document Reviewed: 10/13/2021 Elsevier Patient Education  [place]. Hand Exercises Hand exercises can be helpful for almost anyone. These exercises can strengthen the hands, improve flexibility and movement, and increase blood flow to the hands. These results can make work and daily tasks easier. Hand exercises can be especially helpful for people who have  joint pain from arthritis or have nerve damage from overuse (carpal tunnel syndrome). These exercises can also help people who have injured a hand. Exercises Most of these hand exercises are gentle stretching and motion exercises. It is usually safe to do them often throughout the day. Warming up your hands before exercise may help to reduce stiffness. You can do this with gentle massage or by placing your hands in warm water for 10-15 minutes. It is normal to feel some stretching, pulling, tightness, or mild discomfort as you begin new exercises.  This will gradually improve. Stop an exercise right away if you feel sudden, severe pain or your pain gets worse. Ask your health care provider which exercises are best for you. Knuckle bend or "claw" fist  Stand or sit with your arm, hand, and all five fingers pointed straight up. Make sure to keep your wrist straight during the exercise. Gently bend your fingers down toward your palm until the tips of your fingers are touching the top of your palm. Keep your big knuckle straight and just bend the small knuckles in your fingers. Hold this position for __________ seconds. Straighten (extend) your fingers back to the starting position. Repeat this exercise 5-10 times with each hand. Full finger fist  Stand or sit with your arm, hand, and all five fingers pointed straight up. Make sure to keep your wrist straight during the exercise. Gently bend your fingers into your palm until the tips of your fingers are touching the middle of your palm. Hold this position for __________ seconds. Extend your fingers back to the starting position, stretching every joint fully. Repeat this exercise 5-10 times with each hand. Straight fist Stand or sit with your arm, hand, and all five fingers pointed straight up. Make sure to keep your wrist straight during the exercise. Gently bend your fingers at the big knuckle, where your fingers meet your hand, and the middle knuckle. Keep the knuckle at the tips of your fingers straight and try to touch the bottom of your palm. Hold this position for __________ seconds. Extend your fingers back to the starting position, stretching every joint fully. Repeat this exercise 5-10 times with each hand. Tabletop  Stand or sit with your arm, hand, and all five fingers pointed straight up. Make sure to keep your wrist straight during the exercise. Gently bend your fingers at the big knuckle, where your fingers meet your hand, as far down as you can while keeping the small  knuckles in your fingers straight. Think of forming a tabletop with your fingers. Hold this position for __________ seconds. Extend your fingers back to the starting position, stretching every joint fully. Repeat this exercise 5-10 times with each hand. Finger spread  Place your hand flat on a table with your palm facing down. Make sure your wrist stays straight as you do this exercise. Spread your fingers and thumb apart from each other as far as you can until you feel a gentle stretch. Hold this position for __________ seconds. Bring your fingers and thumb tight together again. Hold this position for __________ seconds. Repeat this exercise 5-10 times with each hand. Making circles  Stand or sit with your arm, hand, and all five fingers pointed straight up. Make sure to keep your wrist straight during the exercise. Make a circle by touching the tip of your thumb to the tip of your index finger. Hold for __________ seconds. Then open your hand wide. Repeat this motion with your thumb and  each finger on your hand. Repeat this exercise 5-10 times with each hand. Thumb motion  Sit with your forearm resting on a table and your wrist straight. Your thumb should be facing up toward the ceiling. Keep your fingers relaxed as you move your thumb. Lift your thumb up as high as you can toward the ceiling. Hold for __________ seconds. Bend your thumb across your palm as far as you can, reaching the tip of your thumb for the small finger (pinkie) side of your palm. Hold for __________ seconds. Repeat this exercise 5-10 times with each hand. Grip strengthening  Hold a stress ball or other soft ball in the middle of your hand. Slowly increase the pressure, squeezing the ball as much as you can without causing pain. Think of bringing the tips of your fingers into the middle of your palm. All of your finger joints should bend when doing this exercise. Hold your squeeze for __________ seconds, then  relax. Repeat this exercise 5-10 times with each hand. Contact a health care provider if: Your hand pain or discomfort gets much worse when you do an exercise. Your hand pain or discomfort does not improve within 2 hours after you exercise. If you have any of these problems, stop doing these exercises right away. Do not do them again unless your health care provider says that you can. Get help right away if: You develop sudden, severe hand pain or swelling. If this happens, stop doing these exercises right away. Do not do them again unless your health care provider says that you can. This information is not intended to replace advice given to you by your health care provider. Make sure you discuss any questions you have with your health care provider. Document Revised: 03/13/2021 Document Reviewed: 03/13/2021 Elsevier Patient Education  [place].

## 2022-07-13 ENCOUNTER — Ambulatory Visit (HOSPITAL_COMMUNITY)
Admission: RE | Admit: 2022-07-13 | Discharge: 2022-07-13 | Disposition: A | Discharge status: Home / Self Care | Payer: Medicare Other | Source: Ambulatory Visit | Attending: Rheumatology | Admitting: Rheumatology

## 2022-07-13 DIAGNOSIS — M0579 Rheumatoid arthritis with rheumatoid factor of multiple sites without organ or systems involvement: Secondary | ICD-10-CM | POA: Insufficient documentation

## 2022-07-13 MED ORDER — DIPHENHYDRAMINE HCL 25 MG PO CAPS: 25.0000 mg | ORAL_CAPSULE | ORAL | Status: DC

## 2022-07-13 MED ORDER — SODIUM CHLORIDE 0.9 % IV SOLN
750.0000 mg | INTRAVENOUS | Status: DC
  Administered 2022-07-13: 750 mg via INTRAVENOUS
  Filled 2022-07-13: qty 30

## 2022-07-13 MED ORDER — ACETAMINOPHEN 325 MG PO TABS: 650.0000 mg | ORAL_TABLET | ORAL | Status: DC

## 2022-07-31 DIAGNOSIS — M7542 Impingement syndrome of left shoulder: Secondary | ICD-10-CM | POA: Diagnosis not present

## 2022-08-04 DIAGNOSIS — B079 Viral wart, unspecified: Secondary | ICD-10-CM | POA: Diagnosis not present

## 2022-08-04 DIAGNOSIS — L570 Actinic keratosis: Secondary | ICD-10-CM | POA: Diagnosis not present

## 2022-08-11 ENCOUNTER — Encounter (HOSPITAL_COMMUNITY): Payer: Medicare Other

## 2022-08-12 ENCOUNTER — Encounter (HOSPITAL_COMMUNITY): Payer: Medicare Other

## 2022-08-13 ENCOUNTER — Encounter (HOSPITAL_COMMUNITY)
Admission: RE | Admit: 2022-08-13 | Discharge: 2022-08-13 | Disposition: A | Discharge status: Home / Self Care | Payer: Medicare Other | Source: Ambulatory Visit | Attending: Rheumatology | Admitting: Rheumatology

## 2022-08-13 DIAGNOSIS — M0579 Rheumatoid arthritis with rheumatoid factor of multiple sites without organ or systems involvement: Secondary | ICD-10-CM | POA: Insufficient documentation

## 2022-08-13 DIAGNOSIS — R944 Abnormal results of kidney function studies: Secondary | ICD-10-CM | POA: Insufficient documentation

## 2022-08-13 DIAGNOSIS — Z79899 Other long term (current) drug therapy: Secondary | ICD-10-CM | POA: Insufficient documentation

## 2022-08-13 LAB — COMPREHENSIVE METABOLIC PANEL
ALT: 25 U/L (ref 0–44)
AST: 22 U/L (ref 15–41)
Albumin: 3.9 g/dL (ref 3.5–5.0)
Alkaline Phosphatase: 101 U/L (ref 38–126)
Anion gap: 8 (ref 5–15)
BUN: 30 mg/dL — ABNORMAL HIGH (ref 8–23)
CO2: 24 mmol/L (ref 22–32)
Calcium: 9.1 mg/dL (ref 8.9–10.3)
Chloride: 107 mmol/L (ref 98–111)
Creatinine, Ser: 0.7 mg/dL (ref 0.44–1.00)
GFR, Estimated: >60 mL/min (ref >60)
Glucose, Bld: 89 mg/dL (ref 70–99)
Potassium: 4.5 mmol/L (ref 3.5–5.1)
Sodium: 139 mmol/L (ref 135–145)
Total Bilirubin: 0.6 mg/dL (ref 0.3–1.2)
Total Protein: 6.4 g/dL — ABNORMAL LOW (ref 6.5–8.1)

## 2022-08-13 LAB — CBC WITH DIFFERENTIAL/PLATELET
Abs Immature Granulocytes: 0.03 10*3/uL (ref 0.00–0.07)
Basophils Absolute: 0.1 10*3/uL (ref 0.0–0.1)
Basophils Relative: 1 %
Eosinophils Absolute: 0.4 10*3/uL (ref 0.0–0.5)
Eosinophils Relative: 4 %
HCT: 40.6 % (ref 36.0–46.0)
Hemoglobin: 13.3 g/dL (ref 12.0–15.0)
Immature Granulocytes: 0 %
Lymphocytes Relative: 21 %
Lymphs Abs: 1.9 10*3/uL (ref 0.7–4.0)
MCH: 27.9 pg (ref 26.0–34.0)
MCHC: 32.8 g/dL (ref 30.0–36.0)
MCV: 85.3 fL (ref 80.0–100.0)
Monocytes Absolute: 0.9 10*3/uL (ref 0.1–1.0)
Monocytes Relative: 9 %
Neutro Abs: 6 10*3/uL (ref 1.7–7.7)
Neutrophils Relative %: 65 %
Platelets: 182 10*3/uL (ref 150–400)
RBC: 4.76 MIL/uL (ref 3.87–5.11)
RDW: 14.6 % (ref 11.5–15.5)
WBC: 9.3 10*3/uL (ref 4.0–10.5)
nRBC: 0 % (ref 0.0–0.2)

## 2022-08-13 MED ORDER — SODIUM CHLORIDE 0.9 % IV SOLN
750.0000 mg | INTRAVENOUS | Status: DC
  Administered 2022-08-13: 750 mg via INTRAVENOUS
  Filled 2022-08-13: qty 30

## 2022-08-13 MED ORDER — ACETAMINOPHEN 325 MG PO TABS: 650.0000 mg | ORAL_TABLET | ORAL | Status: DC

## 2022-08-13 MED ORDER — DIPHENHYDRAMINE HCL 25 MG PO CAPS: 25.0000 mg | ORAL_CAPSULE | ORAL | Status: DC

## 2022-08-13 NOTE — Progress Notes (Signed)
Diagnosis: Rheumatoid Arthritis  Provider:  Bo Merino MD  Procedure: Infusion  IV Type: Peripheral, IV Location: R Hand  Orencia (Abatacept), Dose: 750 mg  Infusion Start Time: 0981  Infusion Stop Time: 1914  Post Infusion IV Care: Patient declined observation and Peripheral IV Discontinued  Discharge: Condition: Good, Destination: Home . AVS provided to patient.   Performed by:  Jonelle Sidle, RN

## 2022-08-13 NOTE — Progress Notes (Signed)
CBC WNL. Total protein is borderline low but improving.  BUN is elevated. Rest of CMP WNL.

## 2022-08-20 DIAGNOSIS — M25512 Pain in left shoulder: Secondary | ICD-10-CM | POA: Diagnosis not present

## 2022-08-21 DIAGNOSIS — Z23 Encounter for immunization: Secondary | ICD-10-CM | POA: Diagnosis not present

## 2022-08-29 DIAGNOSIS — M25512 Pain in left shoulder: Secondary | ICD-10-CM | POA: Diagnosis not present

## 2022-09-10 ENCOUNTER — Encounter (HOSPITAL_COMMUNITY)
Admission: RE | Admit: 2022-09-10 | Discharge: 2022-09-10 | Disposition: A | Discharge status: Home / Self Care | Payer: Medicare Other | Source: Ambulatory Visit | Attending: Rheumatology | Admitting: Rheumatology

## 2022-09-10 VITALS — BP 122/64 | HR 72 | Temp 98.1°F | Resp 18

## 2022-09-10 DIAGNOSIS — M0579 Rheumatoid arthritis with rheumatoid factor of multiple sites without organ or systems involvement: Secondary | ICD-10-CM | POA: Insufficient documentation

## 2022-09-10 MED ORDER — DIPHENHYDRAMINE HCL 25 MG PO CAPS: 25.0000 mg | ORAL_CAPSULE | Freq: Once | ORAL | Status: DC

## 2022-09-10 MED ORDER — SODIUM CHLORIDE 0.9 % IV SOLN
750.0000 mg | Freq: Once | INTRAVENOUS | Status: AC
  Administered 2022-09-10: 750 mg via INTRAVENOUS
  Filled 2022-09-10: qty 30

## 2022-09-10 MED ORDER — ACETAMINOPHEN 325 MG PO TABS: 650.0000 mg | ORAL_TABLET | Freq: Once | ORAL | Status: DC

## 2022-09-10 NOTE — Progress Notes (Signed)
Diagnosis: Rheumatoid Arthritis  Provider:  Bo Merino MD  Procedure: Infusion  IV Type: Peripheral, IV Location: R Antecubital  Orencia (Abatacept), Dose: 750 mg  Infusion Start Time: 0092  Infusion Stop Time: 3300  Post Infusion IV Care: Patient declined observation and Peripheral IV Discontinued  Discharge: Condition: Good, Destination: Home . AVS provided to patient.   Performed by:  Jonelle Sidle, RN

## 2022-09-23 DIAGNOSIS — M7542 Impingement syndrome of left shoulder: Secondary | ICD-10-CM | POA: Diagnosis not present

## 2022-09-23 DIAGNOSIS — M25512 Pain in left shoulder: Secondary | ICD-10-CM | POA: Diagnosis not present

## 2022-09-29 ENCOUNTER — Other Ambulatory Visit: Payer: Self-pay

## 2022-10-08 ENCOUNTER — Encounter (HOSPITAL_COMMUNITY): Payer: Medicare Other

## 2022-10-08 DIAGNOSIS — Z23 Encounter for immunization: Secondary | ICD-10-CM | POA: Diagnosis not present

## 2022-10-12 ENCOUNTER — Other Ambulatory Visit: Payer: Self-pay

## 2022-10-12 DIAGNOSIS — M0579 Rheumatoid arthritis with rheumatoid factor of multiple sites without organ or systems involvement: Secondary | ICD-10-CM | POA: Insufficient documentation

## 2022-10-15 ENCOUNTER — Encounter (HOSPITAL_COMMUNITY)
Admission: RE | Admit: 2022-10-15 | Discharge: 2022-10-15 | Disposition: A | Discharge status: Home / Self Care | Payer: Medicare Other | Source: Ambulatory Visit | Attending: Rheumatology | Admitting: Rheumatology

## 2022-10-15 VITALS — BP 123/57 | HR 64 | Temp 98.2°F | Resp 16 | Ht 65.0 in | Wt 175.7 lb

## 2022-10-15 DIAGNOSIS — M0579 Rheumatoid arthritis with rheumatoid factor of multiple sites without organ or systems involvement: Secondary | ICD-10-CM | POA: Diagnosis not present

## 2022-10-15 LAB — CBC WITH DIFFERENTIAL/PLATELET
Abs Immature Granulocytes: 0.03 10*3/uL (ref 0.00–0.07)
Basophils Absolute: 0 10*3/uL (ref 0.0–0.1)
Basophils Relative: 1 %
Eosinophils Absolute: 0.3 10*3/uL (ref 0.0–0.5)
Eosinophils Relative: 4 %
HCT: 38.8 % (ref 36.0–46.0)
Hemoglobin: 12.8 g/dL (ref 12.0–15.0)
Immature Granulocytes: 0 %
Lymphocytes Relative: 22 %
Lymphs Abs: 1.5 10*3/uL (ref 0.7–4.0)
MCH: 28.8 pg (ref 26.0–34.0)
MCHC: 33 g/dL (ref 30.0–36.0)
MCV: 87.4 fL (ref 80.0–100.0)
Monocytes Absolute: 0.7 10*3/uL (ref 0.1–1.0)
Monocytes Relative: 10 %
Neutro Abs: 4.2 10*3/uL (ref 1.7–7.7)
Neutrophils Relative %: 63 %
Platelets: 167 10*3/uL (ref 150–400)
RBC: 4.44 MIL/uL (ref 3.87–5.11)
RDW: 14.3 % (ref 11.5–15.5)
WBC: 6.7 10*3/uL (ref 4.0–10.5)
nRBC: 0 % (ref 0.0–0.2)

## 2022-10-15 LAB — COMPREHENSIVE METABOLIC PANEL
ALT: 45 U/L — ABNORMAL HIGH (ref 0–44)
AST: 32 U/L (ref 15–41)
Albumin: 3.9 g/dL (ref 3.5–5.0)
Alkaline Phosphatase: 78 U/L (ref 38–126)
Anion gap: 6 (ref 5–15)
BUN: 29 mg/dL — ABNORMAL HIGH (ref 8–23)
CO2: 23 mmol/L (ref 22–32)
Calcium: 9 mg/dL (ref 8.9–10.3)
Chloride: 110 mmol/L (ref 98–111)
Creatinine, Ser: 0.67 mg/dL (ref 0.44–1.00)
GFR, Estimated: >60 mL/min (ref >60)
Glucose, Bld: 89 mg/dL (ref 70–99)
Potassium: 4.3 mmol/L (ref 3.5–5.1)
Sodium: 139 mmol/L (ref 135–145)
Total Bilirubin: 0.7 mg/dL (ref 0.3–1.2)
Total Protein: 6.6 g/dL (ref 6.5–8.1)

## 2022-10-15 MED ORDER — DIPHENHYDRAMINE HCL 25 MG PO CAPS: 25.0000 mg | ORAL_CAPSULE | Freq: Once | ORAL | Status: DC

## 2022-10-15 MED ORDER — ACETAMINOPHEN 325 MG PO TABS: 650.0000 mg | ORAL_TABLET | Freq: Once | ORAL | Status: DC

## 2022-10-15 MED ORDER — SODIUM CHLORIDE 0.9 % IV SOLN
750.0000 mg | Freq: Once | INTRAVENOUS | Status: AC
  Administered 2022-10-15: 750 mg via INTRAVENOUS
  Filled 2022-10-15: qty 30

## 2022-10-15 NOTE — Progress Notes (Signed)
CBC is normal.  Liver function is mildly elevated.  Patient should avoid NSAIDs and Tylenol use.

## 2022-10-15 NOTE — Progress Notes (Signed)
Diagnosis: Rheumatoid Arthritis  Provider:  Bo Merino MD  Procedure: Infusion  IV Type: Peripheral, IV Location: L Hand  Orencia (Abatacept), Dose: 750 mg  Infusion Start Time: 6950  Infusion Stop Time: 7225  Post Infusion IV Care: Peripheral IV Discontinued  Discharge: Condition: Stable, Destination: Home . AVS provided to patient.   Performed by:  Binnie Kand, RN

## 2022-10-22 DIAGNOSIS — I10 Essential (primary) hypertension: Secondary | ICD-10-CM | POA: Diagnosis not present

## 2022-10-22 DIAGNOSIS — Z6827 Body mass index (BMI) 27.0-27.9, adult: Secondary | ICD-10-CM | POA: Diagnosis not present

## 2022-10-22 DIAGNOSIS — J441 Chronic obstructive pulmonary disease with (acute) exacerbation: Secondary | ICD-10-CM | POA: Diagnosis not present

## 2022-10-22 DIAGNOSIS — Z713 Dietary counseling and surveillance: Secondary | ICD-10-CM | POA: Diagnosis not present

## 2022-10-22 DIAGNOSIS — Z299 Encounter for prophylactic measures, unspecified: Secondary | ICD-10-CM | POA: Diagnosis not present

## 2022-11-12 ENCOUNTER — Encounter (HOSPITAL_COMMUNITY)
Admission: RE | Admit: 2022-11-12 | Discharge: 2022-11-12 | Disposition: A | Discharge status: Home / Self Care | Payer: Medicare Other | Source: Ambulatory Visit | Attending: Rheumatology | Admitting: Rheumatology

## 2022-11-12 VITALS — BP 122/62 | HR 67 | Temp 98.0°F | Resp 16 | Ht 65.0 in | Wt 171.5 lb

## 2022-11-12 DIAGNOSIS — M0579 Rheumatoid arthritis with rheumatoid factor of multiple sites without organ or systems involvement: Secondary | ICD-10-CM | POA: Insufficient documentation

## 2022-11-12 MED ORDER — SODIUM CHLORIDE 0.9 % IV SOLN
750.0000 mg | Freq: Once | INTRAVENOUS | Status: AC
  Administered 2022-11-12: 750 mg via INTRAVENOUS
  Filled 2022-11-12: qty 30

## 2022-11-12 MED ORDER — ACETAMINOPHEN 325 MG PO TABS: 650.0000 mg | ORAL_TABLET | Freq: Once | ORAL | Status: DC

## 2022-11-12 MED ORDER — DIPHENHYDRAMINE HCL 25 MG PO CAPS: 25.0000 mg | ORAL_CAPSULE | Freq: Once | ORAL | Status: DC

## 2022-11-12 NOTE — Progress Notes (Signed)
Diagnosis: Rheumatoid Arthritis  Provider:  Bo Merino MD  Procedure: Infusion  IV Type: Peripheral, IV Location: L Hand  Orencia (Abatacept), Dose: 750 mg  Infusion Start Time: 6681  Infusion Stop Time: 5947  Post Infusion IV Care: Patient declined observation and Peripheral IV Discontinued  Discharge: Condition: Good, Destination: Home . AVS provided to patient.   Performed by:  Baxter Hire, RN

## 2022-11-12 NOTE — Addendum Note (Signed)
Encounter addended by: Kateland Leisinger E, RN on: 11/12/2022 1:51 PM  Actions taken: Therapy plan modified

## 2022-11-26 DIAGNOSIS — Z7189 Other specified counseling: Secondary | ICD-10-CM | POA: Diagnosis not present

## 2022-11-26 DIAGNOSIS — Z87891 Personal history of nicotine dependence: Secondary | ICD-10-CM | POA: Diagnosis not present

## 2022-11-26 DIAGNOSIS — Z299 Encounter for prophylactic measures, unspecified: Secondary | ICD-10-CM | POA: Diagnosis not present

## 2022-11-26 DIAGNOSIS — Z79899 Other long term (current) drug therapy: Secondary | ICD-10-CM | POA: Diagnosis not present

## 2022-11-26 DIAGNOSIS — Z1339 Encounter for screening examination for other mental health and behavioral disorders: Secondary | ICD-10-CM | POA: Diagnosis not present

## 2022-11-26 DIAGNOSIS — Z1331 Encounter for screening for depression: Secondary | ICD-10-CM | POA: Diagnosis not present

## 2022-11-26 DIAGNOSIS — I10 Essential (primary) hypertension: Secondary | ICD-10-CM | POA: Diagnosis not present

## 2022-11-26 DIAGNOSIS — R5383 Other fatigue: Secondary | ICD-10-CM | POA: Diagnosis not present

## 2022-11-26 DIAGNOSIS — Z1211 Encounter for screening for malignant neoplasm of colon: Secondary | ICD-10-CM | POA: Diagnosis not present

## 2022-11-26 DIAGNOSIS — E7800 Pure hypercholesterolemia, unspecified: Secondary | ICD-10-CM | POA: Diagnosis not present

## 2022-11-26 DIAGNOSIS — Z0000 Encounter for general adult medical examination without abnormal findings: Secondary | ICD-10-CM | POA: Diagnosis not present

## 2022-11-26 DIAGNOSIS — Z6827 Body mass index (BMI) 27.0-27.9, adult: Secondary | ICD-10-CM | POA: Diagnosis not present

## 2022-12-09 NOTE — Progress Notes (Signed)
Office Visit Note  Patient: Tiffany Pollard             Date of Birth: 1951/01/06           MRN: [id number]             PCP: Monico Blitz, MD Referring: Monico Blitz, MD Visit Date: 12/18/2022 Occupation: '@GUAROCC'$ @  Subjective:  Medication monitoring   History of Present Illness: Tiffany Pollard is a 72 y.o. female with history seropositive rheumatoid arthritis.  She is currently on Orencia 750 mg IV infusions every 28 days.  Her last infusion was on 12/10/22. She continues to tolerate Orencia without any side effects.  She has not had any recent or recurrent infections.  She denies any signs or symptoms of a rheumatoid arthritis flare.  She experiences intermittent pain in the left [facility] joint.  She has not been using a left CMC joint brace recently.  She denies any joint swelling at this time.  Overall she feels her rheumatoid arthritis has been well-controlled.  She is acting as the primary caregiver for her husband and has been under tremendous amount of stress.  She has interrupted sleep at night caring for her husband.   Patient reports that she had the annual flu shot as well as the COVID-19 booster.  She is planning on getting the RSV vaccine.     Activities of Daily Living:  Patient reports morning stiffness for 1 hour  Patient Denies nocturnal pain.  Difficulty dressing/grooming: Denies Difficulty climbing stairs: Denies Difficulty getting out of chair: Denies Difficulty using hands for taps, buttons, cutlery, and/or writing: Reports  Review of Systems  Constitutional:  Positive for fatigue.  HENT:  Positive for mouth dryness. Negative for mouth sores and nose dryness.   Eyes:  Positive for dryness. Negative for pain and visual disturbance.  Respiratory:  Negative for cough, hemoptysis, shortness of breath and difficulty breathing.   Cardiovascular:  Negative for chest pain, palpitations, hypertension and swelling in legs/feet.  Gastrointestinal:  Negative for blood in stool,  constipation and diarrhea.  Endocrine: Negative for increased urination.  Genitourinary:  Negative for painful urination.  Musculoskeletal:  Positive for joint pain, joint pain and morning stiffness. Negative for joint swelling, myalgias, muscle weakness, muscle tenderness and myalgias.  Skin:  Negative for color change, pallor, rash, hair loss, nodules/bumps, skin tightness, ulcers and sensitivity to sunlight.  Neurological:  Negative for dizziness, numbness, headaches and weakness.  Hematological:  Negative for swollen glands.  Psychiatric/Behavioral:  Positive for depressed mood and sleep disturbance. The patient is nervous/anxious.     PMFS History:  Patient Active Problem List   Diagnosis Date Noted   Seropositive rheumatoid arthritis of multiple sites ([place]) 10/12/2022   Primary osteoarthritis of both knees 04/30/2017   History of anemia 04/30/2017   History of migraine 04/30/2017   History of vertigo 04/30/2017   History of gastroesophageal reflux (GERD) 04/30/2017   History of kidney stones 04/30/2017   History of IBS 04/30/2017   History of hyperlipidemia 04/30/2017   Hypercalcemia 04/30/2017   Rheumatoid arthritis involving multiple sites with positive rheumatoid factor ([place]) 12/08/2016   High risk medication use 12/08/2016   Primary osteoarthritis of both hands 12/08/2016   Primary osteoarthritis of both feet 12/08/2016   Osteoarthritis of lumbar spine 12/08/2016   Former smoker 12/08/2016   Abdominal pain, chronic, right lower quadrant 12/11/2013    Past Medical History:  Diagnosis Date   GERD (gastroesophageal reflux disease)    High  blood pressure    Rheumatoid arthritis(714.0)     History reviewed. No pertinent family history. Past Surgical History:  Procedure Laterality Date   BACK SURGERY     CERVICAL CONIZATION W/BX     COLONOSCOPY N/A 12/27/2013   Procedure: COLONOSCOPY;  Surgeon: Rogene Houston, MD;  Location: [facility];  Service: Endoscopy;   Laterality: N/A;  320-moved to 125 Ann to notify pt   FINGER FRACTURE SURGERY     foot surgery for a bunion     NASAL SINUS SURGERY     TONSILLECTOMY     Social History   Social History Narrative   Not on file   Immunization History  Administered Date(s) Administered   Influenza, High Dose Seasonal PF 08/07/2019, 08/14/2020   Influenza,inj,Quad PF,6+ Mos 08/18/2017   PFIZER(Purple Top)SARS-COV-2 Vaccination 01/12/2020, 02/02/2020, 08/22/2020   Pneumococcal Conjugate-13 07/13/2018     Objective: Vital Signs: BP 136/73 (BP Location: Left Arm, Patient Position: Sitting, Cuff Size: Normal)   Pulse 92   Resp 14   Ht '5\' 5"'$  (1.651 m)   Wt 176 lb 9.6 oz (80.1 kg)   BMI 29.39 kg/m    Physical Exam Vitals and nursing note reviewed.  Constitutional:      Appearance: She is well-developed.  HENT:     Head: Normocephalic and atraumatic.  Eyes:     Conjunctiva/sclera: Conjunctivae normal.  Cardiovascular:     Rate and Rhythm: Normal rate and regular rhythm.     Heart sounds: Normal heart sounds.  Pulmonary:     Effort: Pulmonary effort is normal.     Breath sounds: Normal breath sounds.  Abdominal:     General: Bowel sounds are normal.     Palpations: Abdomen is soft.  Musculoskeletal:     Cervical back: Normal range of motion.  Skin:    General: Skin is warm and dry.     Capillary Refill: Capillary refill takes less than 2 seconds.  Neurological:     Mental Status: She is alert and oriented to person, place, and time.  Psychiatric:        Behavior: Behavior normal.      Musculoskeletal Exam: C-spine has limited range of motion with lateral rotation.  No midline spinal tenderness in the thoracic region or lumbar region.  Shoulder joints, elbow joints, wrist joints, MCPs, PIPs, DIPs have good range of motion with no synovitis.  Tenderness over the left CMC joint.  PIP and DIP thickening consistent with osteoarthritis of both hands.  Complete fist formation bilaterally.  Hip  joints have good range of motion with no groin pain.  Knee joints have good range of motion with no warmth or effusion.  Ankle joints have good range of motion with no tenderness or joint swelling.  Tailor bunions noted bilaterally.  Dorsal spurs noted bilaterally.  Right second hammertoe noted.  PIP and DIP thickening consistent with osteoarthritis of both feet.  CDAI Exam: CDAI Score: -- Patient Global: 3 mm; Provider Global: 3 mm Swollen: --; Tender: -- Joint Exam 12/18/2022   No joint exam has been documented for this visit   There is currently no information documented on the homunculus. Go to the Rheumatology activity and complete the homunculus joint exam.  Investigation: No additional findings.  Imaging: No results found.  Recent Labs: Lab Results  Component Value Date   WBC 7.5 12/10/2022   HGB 13.8 12/10/2022   PLT 171 12/10/2022   NA 138 12/10/2022   K 4.2 12/10/2022  CL 103 12/10/2022   CO2 26 12/10/2022   GLUCOSE 101 (H) 12/10/2022   BUN 21 12/10/2022   CREATININE 0.81 12/10/2022   BILITOT 0.7 12/10/2022   ALKPHOS 81 12/10/2022   AST 28 12/10/2022   ALT 29 12/10/2022   PROT 7.0 12/10/2022   ALBUMIN 4.1 12/10/2022   CALCIUM 9.0 12/10/2022   GFRAA >60 08/29/2020   QFTBGOLD Negative 03/11/2017   QFTBGOLDPLUS Negative 04/20/2022    Speciality Comments: Simply Aria IV-recurrent infections, Enbrel-insurance issues  Procedures:  No procedures performed Allergies: Tetanus toxoid   Assessment / Plan:     Visit Diagnoses: Rheumatoid arthritis involving multiple sites with positive rheumatoid factor (HCC) - +RF, +CCP, ANA-: She has no joint tenderness or synovitis on examination today.  She has not had any signs or symptoms of a rheumatoid arthritis flare.  She has clinically been doing well on Orencia 750 mg IV infusions every 28 days.  Her last infusion was administered on 12/10/2022.  She continues to tolerate Orencia without any side effects.  She has not had  any recent or recurrent infections.  Her morning stiffness lasts for about 1 hour daily.  She has not had any nocturnal pain or difficulty with ADLs.  She experiences intermittent pain and stiffness in both hands and both feet due to underlying osteoarthritis.  She has tenderness over the left [facility] joint on examination today.  She will remain on Orencia as monotherapy.  She was advised to notify us if she develops signs or symptoms of a flare.  She will follow-up in the office in 5 months or sooner if needed.  High risk medication use - Orencia 750 mg IV infusions every 28 days.   Previous therapy: d/c Enbrel due to lack of insurance. D/c Simponi due to recurrent infections. TB gold negative on 04/20/22.  CBC and CMP updated on 12/10/22.  She will continue to have lab work drawn with her infusions.  No recent or recurrent infections. Discussed the importance of holding orencia if she develops signs or symptoms of an infection and to resume once the infection has completely cleared.  She received the annual flu shot and COVID-19 booster.  She is planning on receiving the RSV vaccine.   Neck pain: C-spine has slightly limited range of motion with lateral rotation.  She experiences intermittent pain and stiffness in her neck.  She has not had any symptoms of radiculopathy.  Primary osteoarthritis of both hands: Tenderness over the left CMC joint noted on examination today.  Some PIP and DIP prominence consistent with osteoarthritic changes.  She experiences intermittent pain and stiffness in both hands due to underlying osteoarthritis.  She has no inflammation on examination today.  Discussed the importance of joint protection and muscle strengthening.  Primary osteoarthritis of both knees: She has good range of motion of both knee joints on examination today.  No warmth or effusion noted.  Primary osteoarthritis of both feet: Ankle joints have good ROM with no tenderness or synovitis.  Tailor bunions noted  bilaterally.  Right second hammertoe noted.  Dorsal spurs noted bilaterally, right > left.  Discussed the importance of wearing proper fitting shoes.    Spondylosis of lumbar region without myelopathy or radiculopathy:  She has intermittent discomfort in her lower back.  No symptoms of radiculopathy.   Age-related osteoporosis without current pathological fracture - DEXA on 03/11/2020 BMD measured at [facility] spine L1 1 through L4 0.928 with a T score of -2.  DEXA ordered by PCP.  Due to update DEXA.  She is taking a calcium and vitamin D supplement daily.  Postural kyphosis of thoracic region: No midline spinal tenderness in the thoracic region.   Other medical conditions are listed as follows:  History of vertigo  History of kidney stones  History of migraine  Anxiety - Her husband has a stage IV lung cancer.  Currently under the care of home hospice.  She continues to act as the primary caregiver.  History of IBS  History of gastroesophageal reflux (GERD)  History of anemia  History of hyperlipidemia  Former smoker - she has been followed by pulmonologist and had CT scan of her chest in March 2019.  Orders: No orders of the defined types were placed in this encounter.  No orders of the defined types were placed in this encounter.    Follow-Up Instructions: Return in about 5 months (around 05/19/2023) for Rheumatoid arthritis.   Ofilia Neas, PA-C  Note - This record has been created using Dragon software.  Chart creation errors have been sought, but may not always  have been located. Such creation errors do not reflect on  the standard of medical care.

## 2022-12-10 ENCOUNTER — Encounter (HOSPITAL_COMMUNITY)
Admission: RE | Admit: 2022-12-10 | Discharge: 2022-12-10 | Disposition: A | Discharge status: Home / Self Care | Payer: Medicare Other | Source: Ambulatory Visit | Attending: Rheumatology | Admitting: Rheumatology

## 2022-12-10 VITALS — BP 131/54 | HR 63 | Temp 98.1°F | Resp 16

## 2022-12-10 DIAGNOSIS — M0579 Rheumatoid arthritis with rheumatoid factor of multiple sites without organ or systems involvement: Secondary | ICD-10-CM | POA: Diagnosis not present

## 2022-12-10 LAB — COMPREHENSIVE METABOLIC PANEL
ALT: 29 U/L (ref 0–44)
AST: 28 U/L (ref 15–41)
Albumin: 4.1 g/dL (ref 3.5–5.0)
Alkaline Phosphatase: 81 U/L (ref 38–126)
Anion gap: 9 (ref 5–15)
BUN: 21 mg/dL (ref 8–23)
CO2: 26 mmol/L (ref 22–32)
Calcium: 9 mg/dL (ref 8.9–10.3)
Chloride: 103 mmol/L (ref 98–111)
Creatinine, Ser: 0.81 mg/dL (ref 0.44–1.00)
GFR, Estimated: >60 mL/min (ref >60)
Glucose, Bld: 101 mg/dL — ABNORMAL HIGH (ref 70–99)
Potassium: 4.2 mmol/L (ref 3.5–5.1)
Sodium: 138 mmol/L (ref 135–145)
Total Bilirubin: 0.7 mg/dL (ref 0.3–1.2)
Total Protein: 7 g/dL (ref 6.5–8.1)

## 2022-12-10 LAB — CBC WITH DIFFERENTIAL/PLATELET
Abs Immature Granulocytes: 0.02 10*3/uL (ref 0.00–0.07)
Basophils Absolute: 0 10*3/uL (ref 0.0–0.1)
Basophils Relative: 1 %
Eosinophils Absolute: 0.2 10*3/uL (ref 0.0–0.5)
Eosinophils Relative: 3 %
HCT: 43.3 % (ref 36.0–46.0)
Hemoglobin: 13.8 g/dL (ref 12.0–15.0)
Immature Granulocytes: 0 %
Lymphocytes Relative: 23 %
Lymphs Abs: 1.7 10*3/uL (ref 0.7–4.0)
MCH: 28.7 pg (ref 26.0–34.0)
MCHC: 31.9 g/dL (ref 30.0–36.0)
MCV: 90 fL (ref 80.0–100.0)
Monocytes Absolute: 0.8 10*3/uL (ref 0.1–1.0)
Monocytes Relative: 10 %
Neutro Abs: 4.8 10*3/uL (ref 1.7–7.7)
Neutrophils Relative %: 63 %
Platelets: 171 10*3/uL (ref 150–400)
RBC: 4.81 MIL/uL (ref 3.87–5.11)
RDW: 13.2 % (ref 11.5–15.5)
WBC: 7.5 10*3/uL (ref 4.0–10.5)
nRBC: 0 % (ref 0.0–0.2)

## 2022-12-10 MED ORDER — ACETAMINOPHEN 325 MG PO TABS: 650.0000 mg | ORAL_TABLET | Freq: Once | ORAL | Status: DC

## 2022-12-10 MED ORDER — SODIUM CHLORIDE 0.9 % IV SOLN
750.0000 mg | Freq: Once | INTRAVENOUS | Status: AC
  Administered 2022-12-10: 750 mg via INTRAVENOUS
  Filled 2022-12-10: qty 30

## 2022-12-10 MED ORDER — DIPHENHYDRAMINE HCL 25 MG PO CAPS: 25.0000 mg | ORAL_CAPSULE | Freq: Once | ORAL | Status: DC

## 2022-12-10 NOTE — Progress Notes (Signed)
CBC and CMP are normal.

## 2022-12-10 NOTE — Progress Notes (Signed)
Diagnosis: Rheumatoid Arthritis  Provider:  Bo Merino MD  Procedure: Infusion  IV Type: Peripheral, IV Location: R Antecubital  Orencia (Abatacept), Dose: 750 mg  Infusion Start Time: 8102  Infusion Stop Time: 5486  Post Infusion IV Care: Patient declined observation  Discharge: Condition: Good, Destination: Home . AVS provided to patient.   Performed by:  Grayland Ormond, RN

## 2022-12-16 ENCOUNTER — Ambulatory Visit: Payer: Medicare Other | Admitting: Physician Assistant

## 2022-12-18 ENCOUNTER — Ambulatory Visit: Payer: Medicare Other | Attending: Physician Assistant | Admitting: Physician Assistant

## 2022-12-18 ENCOUNTER — Encounter: Payer: Self-pay | Admitting: Physician Assistant

## 2022-12-18 VITALS — BP 136/73 | HR 92 | Resp 14 | Ht 65.0 in | Wt 176.6 lb

## 2022-12-18 DIAGNOSIS — Z87442 Personal history of urinary calculi: Secondary | ICD-10-CM | POA: Diagnosis not present

## 2022-12-18 DIAGNOSIS — M19042 Primary osteoarthritis, left hand: Secondary | ICD-10-CM | POA: Insufficient documentation

## 2022-12-18 DIAGNOSIS — M4004 Postural kyphosis, thoracic region: Secondary | ICD-10-CM | POA: Diagnosis not present

## 2022-12-18 DIAGNOSIS — Z8669 Personal history of other diseases of the nervous system and sense organs: Secondary | ICD-10-CM | POA: Insufficient documentation

## 2022-12-18 DIAGNOSIS — Z87898 Personal history of other specified conditions: Secondary | ICD-10-CM | POA: Insufficient documentation

## 2022-12-18 DIAGNOSIS — Z79899 Other long term (current) drug therapy: Secondary | ICD-10-CM | POA: Insufficient documentation

## 2022-12-18 DIAGNOSIS — M19072 Primary osteoarthritis, left ankle and foot: Secondary | ICD-10-CM | POA: Insufficient documentation

## 2022-12-18 DIAGNOSIS — M47816 Spondylosis without myelopathy or radiculopathy, lumbar region: Secondary | ICD-10-CM | POA: Insufficient documentation

## 2022-12-18 DIAGNOSIS — M19071 Primary osteoarthritis, right ankle and foot: Secondary | ICD-10-CM | POA: Insufficient documentation

## 2022-12-18 DIAGNOSIS — Z862 Personal history of diseases of the blood and blood-forming organs and certain disorders involving the immune mechanism: Secondary | ICD-10-CM | POA: Insufficient documentation

## 2022-12-18 DIAGNOSIS — M170 Bilateral primary osteoarthritis of knee: Secondary | ICD-10-CM | POA: Insufficient documentation

## 2022-12-18 DIAGNOSIS — Z8719 Personal history of other diseases of the digestive system: Secondary | ICD-10-CM | POA: Insufficient documentation

## 2022-12-18 DIAGNOSIS — M542 Cervicalgia: Secondary | ICD-10-CM | POA: Diagnosis not present

## 2022-12-18 DIAGNOSIS — M19041 Primary osteoarthritis, right hand: Secondary | ICD-10-CM | POA: Diagnosis not present

## 2022-12-18 DIAGNOSIS — M0579 Rheumatoid arthritis with rheumatoid factor of multiple sites without organ or systems involvement: Secondary | ICD-10-CM | POA: Insufficient documentation

## 2022-12-18 DIAGNOSIS — Z87891 Personal history of nicotine dependence: Secondary | ICD-10-CM | POA: Insufficient documentation

## 2022-12-18 DIAGNOSIS — F419 Anxiety disorder, unspecified: Secondary | ICD-10-CM | POA: Diagnosis not present

## 2022-12-18 DIAGNOSIS — Z8639 Personal history of other endocrine, nutritional and metabolic disease: Secondary | ICD-10-CM | POA: Insufficient documentation

## 2022-12-18 DIAGNOSIS — M810 Age-related osteoporosis without current pathological fracture: Secondary | ICD-10-CM | POA: Diagnosis not present

## 2023-01-07 ENCOUNTER — Encounter (HOSPITAL_COMMUNITY)
Admission: RE | Admit: 2023-01-07 | Discharge: 2023-01-07 | Disposition: A | Discharge status: Home / Self Care | Payer: Medicare Other | Source: Ambulatory Visit | Attending: Rheumatology | Admitting: Rheumatology

## 2023-01-07 VITALS — BP 131/61 | HR 71 | Temp 98.0°F | Resp 16 | Ht 65.0 in | Wt 177.2 lb

## 2023-01-07 DIAGNOSIS — M0579 Rheumatoid arthritis with rheumatoid factor of multiple sites without organ or systems involvement: Secondary | ICD-10-CM | POA: Diagnosis not present

## 2023-01-07 MED ORDER — DIPHENHYDRAMINE HCL 25 MG PO CAPS: 25.0000 mg | ORAL_CAPSULE | Freq: Once | ORAL | Status: DC

## 2023-01-07 MED ORDER — SODIUM CHLORIDE 0.9 % IV SOLN
750.0000 mg | Freq: Once | INTRAVENOUS | Status: AC
  Administered 2023-01-07: 750 mg via INTRAVENOUS
  Filled 2023-01-07: qty 30

## 2023-01-07 MED ORDER — ACETAMINOPHEN 325 MG PO TABS: 650.0000 mg | ORAL_TABLET | Freq: Once | ORAL | Status: DC

## 2023-01-07 NOTE — Progress Notes (Signed)
Diagnosis: Rheumatoid Arthritis  Provider:  Bo Merino MD  Procedure: Infusion  IV Type: Peripheral, IV Location: R Antecubital  Orencia (Abatacept), Dose: 750 mg  Infusion Start Time: 1696  Infusion Stop Time: 7893  Post Infusion IV Care: Patient declined observation and Peripheral IV Discontinued  Discharge: Condition: Good, Destination: Home . AVS provided to patient.   Performed by:  Baxter Hire, RN

## 2023-02-01 DIAGNOSIS — L570 Actinic keratosis: Secondary | ICD-10-CM | POA: Diagnosis not present

## 2023-02-01 DIAGNOSIS — B078 Other viral warts: Secondary | ICD-10-CM | POA: Diagnosis not present

## 2023-02-01 DIAGNOSIS — D485 Neoplasm of uncertain behavior of skin: Secondary | ICD-10-CM | POA: Diagnosis not present

## 2023-02-03 ENCOUNTER — Other Ambulatory Visit: Payer: Self-pay

## 2023-02-04 ENCOUNTER — Encounter (HOSPITAL_COMMUNITY)
Admission: RE | Admit: 2023-02-04 | Discharge: 2023-02-04 | Disposition: A | Discharge status: Home / Self Care | Payer: Medicare Other | Source: Ambulatory Visit | Attending: Rheumatology | Admitting: Rheumatology

## 2023-02-04 VITALS — BP 132/60 | HR 65 | Temp 97.9°F | Resp 16

## 2023-02-04 DIAGNOSIS — M0579 Rheumatoid arthritis with rheumatoid factor of multiple sites without organ or systems involvement: Secondary | ICD-10-CM | POA: Diagnosis not present

## 2023-02-04 LAB — CBC WITH DIFFERENTIAL/PLATELET
Abs Immature Granulocytes: 0.02 10*3/uL (ref 0.00–0.07)
Basophils Absolute: 0.1 10*3/uL (ref 0.0–0.1)
Basophils Relative: 1 %
Eosinophils Absolute: 0.3 10*3/uL (ref 0.0–0.5)
Eosinophils Relative: 3 %
HCT: 40.2 % (ref 36.0–46.0)
Hemoglobin: 12.9 g/dL (ref 12.0–15.0)
Immature Granulocytes: 0 %
Lymphocytes Relative: 18 %
Lymphs Abs: 1.6 10*3/uL (ref 0.7–4.0)
MCH: 27.9 pg (ref 26.0–34.0)
MCHC: 32.1 g/dL (ref 30.0–36.0)
MCV: 86.8 fL (ref 80.0–100.0)
Monocytes Absolute: 0.8 10*3/uL (ref 0.1–1.0)
Monocytes Relative: 9 %
Neutro Abs: 6 10*3/uL (ref 1.7–7.7)
Neutrophils Relative %: 69 %
Platelets: 225 10*3/uL (ref 150–400)
RBC: 4.63 MIL/uL (ref 3.87–5.11)
RDW: 13 % (ref 11.5–15.5)
WBC: 8.7 10*3/uL (ref 4.0–10.5)
nRBC: 0 % (ref 0.0–0.2)

## 2023-02-04 LAB — COMPREHENSIVE METABOLIC PANEL
ALT: 19 U/L (ref 0–44)
AST: 25 U/L (ref 15–41)
Albumin: 3.9 g/dL (ref 3.5–5.0)
Alkaline Phosphatase: 95 U/L (ref 38–126)
Anion gap: 8 (ref 5–15)
BUN: 23 mg/dL (ref 8–23)
CO2: 24 mmol/L (ref 22–32)
Calcium: 9 mg/dL (ref 8.9–10.3)
Chloride: 105 mmol/L (ref 98–111)
Creatinine, Ser: 0.72 mg/dL (ref 0.44–1.00)
GFR, Estimated: >60 mL/min (ref >60)
Glucose, Bld: 108 mg/dL — ABNORMAL HIGH (ref 70–99)
Potassium: 3.8 mmol/L (ref 3.5–5.1)
Sodium: 137 mmol/L (ref 135–145)
Total Bilirubin: 0.6 mg/dL (ref 0.3–1.2)
Total Protein: 6.9 g/dL (ref 6.5–8.1)

## 2023-02-04 MED ORDER — SODIUM CHLORIDE 0.9 % IV SOLN
750.0000 mg | Freq: Once | INTRAVENOUS | Status: AC
  Administered 2023-02-04: 750 mg via INTRAVENOUS
  Filled 2023-02-04: qty 30

## 2023-02-04 NOTE — Progress Notes (Signed)
Diagnosis: Rheumatoid Arthritis  Provider:  Bo Merino MD  Procedure: Infusion  IV Type: Peripheral, IV Location: L Antecubital  Orencia (Abatacept), Dose: 750 mg  Infusion Start Time: [id number]  Infusion Stop Time: 1120  Post Infusion IV Care: Patient declined observation and Peripheral IV Discontinued  Discharge: Condition: Stable, Destination: Home . AVS Provided  Performed by:  Binnie Kand, RN

## 2023-02-04 NOTE — Progress Notes (Signed)
CBC and CMP are normal.

## 2023-02-17 DIAGNOSIS — J441 Chronic obstructive pulmonary disease with (acute) exacerbation: Secondary | ICD-10-CM | POA: Diagnosis not present

## 2023-02-17 DIAGNOSIS — I700 Atherosclerosis of aorta: Secondary | ICD-10-CM | POA: Diagnosis not present

## 2023-02-17 DIAGNOSIS — M069 Rheumatoid arthritis, unspecified: Secondary | ICD-10-CM | POA: Diagnosis not present

## 2023-02-17 DIAGNOSIS — L719 Rosacea, unspecified: Secondary | ICD-10-CM | POA: Diagnosis not present

## 2023-02-17 DIAGNOSIS — Z299 Encounter for prophylactic measures, unspecified: Secondary | ICD-10-CM | POA: Diagnosis not present

## 2023-02-22 ENCOUNTER — Telehealth: Payer: Self-pay | Admitting: *Deleted

## 2023-02-22 NOTE — Telephone Encounter (Signed)
Recommend postponing her infusion by 1 week after symptom resolution.

## 2023-02-22 NOTE — Telephone Encounter (Signed)
Patient contacted the office stating she has an infusion scheduled for 03/04/2023. Patient states she tested positive for Covid on 02/19/2023. Patient states she is having mild symptoms. Patient would like to know if she should postpone her infusion. Please advise.

## 2023-02-22 NOTE — Telephone Encounter (Signed)
Patient advised Tiffany Pollard recommends postponing her infusion by 1 week after symptom resolution. Patient expressed understanding.

## 2023-03-04 ENCOUNTER — Encounter (HOSPITAL_COMMUNITY)
Admission: RE | Admit: 2023-03-04 | Payer: Medicare Other | Source: Ambulatory Visit | Attending: Rheumatology | Admitting: Rheumatology

## 2023-03-11 ENCOUNTER — Encounter (HOSPITAL_COMMUNITY)
Admission: RE | Admit: 2023-03-11 | Discharge: 2023-03-11 | Disposition: A | Discharge status: Home / Self Care | Payer: Medicare Other | Source: Ambulatory Visit | Attending: Rheumatology | Admitting: Rheumatology

## 2023-03-11 VITALS — BP 125/52 | HR 71 | Temp 97.8°F | Resp 18 | Wt 165.0 lb

## 2023-03-11 DIAGNOSIS — M0579 Rheumatoid arthritis with rheumatoid factor of multiple sites without organ or systems involvement: Secondary | ICD-10-CM | POA: Insufficient documentation

## 2023-03-11 MED ORDER — ACETAMINOPHEN 325 MG PO TABS: 650.0000 mg | ORAL_TABLET | Freq: Once | ORAL | Status: DC

## 2023-03-11 MED ORDER — SODIUM CHLORIDE 0.9 % IV SOLN
750.0000 mg | Freq: Once | INTRAVENOUS | Status: AC
  Administered 2023-03-11: 750 mg via INTRAVENOUS
  Filled 2023-03-11: qty 30

## 2023-03-11 MED ORDER — DIPHENHYDRAMINE HCL 25 MG PO CAPS: 25.0000 mg | ORAL_CAPSULE | Freq: Once | ORAL | Status: DC

## 2023-03-11 NOTE — Progress Notes (Signed)
Diagnosis: Rheumatoid Arthritis  Provider:  Bo Merino MD  Procedure: Infusion  IV Type: Peripheral, IV Location: L Forearm  Orencia (Abatacept), Dose: 750 mg  Infusion Start time  1010 am  Infusion Stop Time: [id number] am  Post Infusion IV Care: Observation period completed and Peripheral IV Discontinued  Discharge: Condition: Good, Destination: Home . AVS Provided  Performed by:  Oren Beckmann, RN

## 2023-03-23 DIAGNOSIS — L570 Actinic keratosis: Secondary | ICD-10-CM | POA: Diagnosis not present

## 2023-03-23 DIAGNOSIS — L905 Scar conditions and fibrosis of skin: Secondary | ICD-10-CM | POA: Diagnosis not present

## 2023-03-23 DIAGNOSIS — D485 Neoplasm of uncertain behavior of skin: Secondary | ICD-10-CM | POA: Diagnosis not present

## 2023-04-08 ENCOUNTER — Encounter (HOSPITAL_COMMUNITY)
Admission: RE | Admit: 2023-04-08 | Discharge: 2023-04-08 | Disposition: A | Discharge status: Home / Self Care | Payer: Medicare Other | Source: Ambulatory Visit | Attending: Rheumatology | Admitting: Rheumatology

## 2023-04-08 VITALS — BP 116/58 | HR 74 | Temp 97.6°F | Resp 16 | Ht 65.0 in | Wt 173.3 lb

## 2023-04-08 DIAGNOSIS — M0579 Rheumatoid arthritis with rheumatoid factor of multiple sites without organ or systems involvement: Secondary | ICD-10-CM | POA: Diagnosis not present

## 2023-04-08 LAB — CBC WITH DIFFERENTIAL/PLATELET
Abs Immature Granulocytes: 0.02 10*3/uL (ref 0.00–0.07)
Basophils Absolute: 0.1 10*3/uL (ref 0.0–0.1)
Basophils Relative: 1 %
Eosinophils Absolute: 0.4 10*3/uL (ref 0.0–0.5)
Eosinophils Relative: 5 %
HCT: 41.2 % (ref 36.0–46.0)
Hemoglobin: 13.2 g/dL (ref 12.0–15.0)
Immature Granulocytes: 0 %
Lymphocytes Relative: 21 %
Lymphs Abs: 1.5 10*3/uL (ref 0.7–4.0)
MCH: 27.7 pg (ref 26.0–34.0)
MCHC: 32 g/dL (ref 30.0–36.0)
MCV: 86.6 fL (ref 80.0–100.0)
Monocytes Absolute: 0.6 10*3/uL (ref 0.1–1.0)
Monocytes Relative: 9 %
Neutro Abs: 4.6 10*3/uL (ref 1.7–7.7)
Neutrophils Relative %: 64 %
Platelets: 179 10*3/uL (ref 150–400)
RBC: 4.76 MIL/uL (ref 3.87–5.11)
RDW: 15 % (ref 11.5–15.5)
WBC: 7.1 10*3/uL (ref 4.0–10.5)
nRBC: 0 % (ref 0.0–0.2)

## 2023-04-08 LAB — COMPREHENSIVE METABOLIC PANEL
ALT: 23 U/L (ref 0–44)
AST: 26 U/L (ref 15–41)
Albumin: 4 g/dL (ref 3.5–5.0)
Alkaline Phosphatase: 79 U/L (ref 38–126)
Anion gap: 8 (ref 5–15)
BUN: 21 mg/dL (ref 8–23)
CO2: 24 mmol/L (ref 22–32)
Calcium: 9.4 mg/dL (ref 8.9–10.3)
Chloride: 104 mmol/L (ref 98–111)
Creatinine, Ser: 0.78 mg/dL (ref 0.44–1.00)
GFR, Estimated: >60 mL/min (ref >60)
Glucose, Bld: 102 mg/dL — ABNORMAL HIGH (ref 70–99)
Potassium: 4.1 mmol/L (ref 3.5–5.1)
Sodium: 136 mmol/L (ref 135–145)
Total Bilirubin: 0.8 mg/dL (ref 0.3–1.2)
Total Protein: 6.8 g/dL (ref 6.5–8.1)

## 2023-04-08 MED ORDER — DIPHENHYDRAMINE HCL 25 MG PO CAPS: 25.0000 mg | ORAL_CAPSULE | Freq: Once | ORAL | Status: DC

## 2023-04-08 MED ORDER — ACETAMINOPHEN 325 MG PO TABS: 650.0000 mg | ORAL_TABLET | Freq: Once | ORAL | Status: DC

## 2023-04-08 MED ORDER — SODIUM CHLORIDE 0.9 % IV SOLN
750.0000 mg | Freq: Once | INTRAVENOUS | Status: AC
  Administered 2023-04-08: 750 mg via INTRAVENOUS
  Filled 2023-04-08: qty 30

## 2023-04-08 NOTE — Progress Notes (Signed)
Diagnosis: Rheumatoid Arthritis  Provider:  Pollyann Savoy MD  Procedure: Infusion  IV Type: Peripheral, IV Location: L Wrist  Orencia (Abatacept), Dose: 750 mg  Infusion Start time  1025 am  Infusion Stop Time: 1055 am  Post Infusion IV Care: Peripheral IV discontinued.  Discharge: Condition: Good, Destination: Home . AVS declined.  Performed by:  Wyvonne Lenz, RN

## 2023-04-08 NOTE — Progress Notes (Signed)
CBC and CMP are normal.

## 2023-04-12 LAB — QUANTIFERON-TB GOLD PLUS: QuantiFERON-TB Gold Plus: NEGATIVE

## 2023-04-12 LAB — QUANTIFERON-TB GOLD PLUS (RQFGPL)
QuantiFERON Mitogen Value: >10 IU/mL
QuantiFERON Nil Value: 0 IU/mL
QuantiFERON TB1 Ag Value: 0 IU/mL
QuantiFERON TB2 Ag Value: 0 IU/mL

## 2023-04-13 NOTE — Progress Notes (Signed)
TB Gold is negative.

## 2023-04-23 DIAGNOSIS — I10 Essential (primary) hypertension: Secondary | ICD-10-CM | POA: Diagnosis not present

## 2023-04-23 DIAGNOSIS — Z299 Encounter for prophylactic measures, unspecified: Secondary | ICD-10-CM | POA: Diagnosis not present

## 2023-04-23 DIAGNOSIS — R911 Solitary pulmonary nodule: Secondary | ICD-10-CM | POA: Diagnosis not present

## 2023-04-29 DIAGNOSIS — J439 Emphysema, unspecified: Secondary | ICD-10-CM | POA: Diagnosis not present

## 2023-04-29 DIAGNOSIS — I700 Atherosclerosis of aorta: Secondary | ICD-10-CM | POA: Diagnosis not present

## 2023-04-29 DIAGNOSIS — J479 Bronchiectasis, uncomplicated: Secondary | ICD-10-CM | POA: Diagnosis not present

## 2023-04-29 DIAGNOSIS — I2510 Atherosclerotic heart disease of native coronary artery without angina pectoris: Secondary | ICD-10-CM | POA: Diagnosis not present

## 2023-04-29 DIAGNOSIS — R918 Other nonspecific abnormal finding of lung field: Secondary | ICD-10-CM | POA: Diagnosis not present

## 2023-05-06 ENCOUNTER — Encounter (HOSPITAL_COMMUNITY): Payer: Medicare Other

## 2023-05-06 NOTE — Progress Notes (Unsigned)
Office Visit Note  Patient: Tiffany Pollard             Date of Birth: 1951/05/04           MRN: [id number]             PCP: Kirstie Peri, MD Referring: Kirstie Peri, MD Visit Date: 05/19/2023 Occupation: @GUAROCC @  Subjective:  Medication monitoring   History of Present Illness: Tiffany Pollard is a 72 y.o. female with history of seropositive rheumatoid arthritis and osteoarthritis.  Patient remains on Orencia 750 mg IV infusions every 28 days.  Her last infusion was administered on 05/07/2023.  She has not had any gaps in therapy.  She has been tolerating Orencia without any side effects.  Patient states that she had a flare this past weekend involving her right hand.  She states that she has been under ongoing stress as well as has been grieving the loss of her husband.  She has started increasing her activity level and has started to walk 1 mile daily for exercise.  She also had an updated bone density on 05/14/2023 consistent with osteopenia.  She has been taking a calcium and vitamin D supplement daily.  She denies any recent falls or fractures.  Patient states that she is currently awaiting appointment at [facility] pulmonary.  She states that she had an updated chest CT on 04/29/2023 which revealed a new pulmonary nodule as well as bronchiectasis. She denies any recent or recurrent infections.      Activities of Daily Living:  Patient reports morning stiffness for 0 minutes  Patient Denies nocturnal pain.  Difficulty dressing/grooming: Denies Difficulty climbing stairs: Denies Difficulty getting out of chair: Reports Difficulty using hands for taps, buttons, cutlery, and/or writing: Denies  Review of Systems  Constitutional:  Positive for fatigue.  HENT:  Negative for mouth sores, mouth dryness and nose dryness.   Eyes:  Negative for pain, visual disturbance and dryness.  Respiratory:  Positive for shortness of breath. Negative for cough, hemoptysis and difficulty breathing.    Cardiovascular:  Negative for chest pain, palpitations, hypertension and swelling in legs/feet.  Gastrointestinal:  Negative for blood in stool, constipation and diarrhea.  Endocrine: Negative for increased urination.  Genitourinary:  Negative for painful urination.  Musculoskeletal:  Positive for joint pain and joint pain. Negative for joint swelling, myalgias, muscle weakness, morning stiffness, muscle tenderness and myalgias.  Skin:  Negative for color change, pallor, rash, hair loss, nodules/bumps, skin tightness, ulcers and sensitivity to sunlight.  Allergic/Immunologic: Negative for susceptible to infections.  Neurological:  Negative for dizziness, numbness, headaches and weakness.  Hematological:  Negative for swollen glands.  Psychiatric/Behavioral:  Positive for depressed mood. Negative for sleep disturbance. The patient is nervous/anxious.     PMFS History:  Patient Active Problem List   Diagnosis Date Noted   Seropositive rheumatoid arthritis of multiple sites (HCC) 10/12/2022   Primary osteoarthritis of both knees 04/30/2017   History of anemia 04/30/2017   History of migraine 04/30/2017   History of vertigo 04/30/2017   History of gastroesophageal reflux (GERD) 04/30/2017   History of kidney stones 04/30/2017   History of IBS 04/30/2017   History of hyperlipidemia 04/30/2017   Hypercalcemia 04/30/2017   Rheumatoid arthritis involving multiple sites with positive rheumatoid factor (HCC) 12/08/2016   High risk medication use 12/08/2016   Primary osteoarthritis of both hands 12/08/2016   Primary osteoarthritis of both feet 12/08/2016   Osteoarthritis of lumbar spine 12/08/2016   Former smoker  12/08/2016   Abdominal pain, chronic, right lower quadrant 12/11/2013    Past Medical History:  Diagnosis Date   GERD (gastroesophageal reflux disease)    High blood pressure    Rheumatoid arthritis(714.0)     History reviewed. No pertinent family history. Past Surgical  History:  Procedure Laterality Date   BACK SURGERY     CERVICAL CONIZATION W/BX     COLONOSCOPY N/A 12/27/2013   Procedure: COLONOSCOPY;  Surgeon: Malissa Hippo, MD;  Location: [facility];  Service: Endoscopy;  Laterality: N/A;  320-moved to 125 Ann to notify pt   FINGER FRACTURE SURGERY     foot surgery for a bunion     NASAL SINUS SURGERY     TONSILLECTOMY     Social History   Social History Narrative   Not on file   Immunization History  Administered Date(s) Administered   Influenza, High Dose Seasonal PF 08/07/2019, 08/14/2020   Influenza,inj,Quad PF,6+ Mos 08/18/2017   PFIZER(Purple Top)SARS-COV-2 Vaccination 01/12/2020, 02/02/2020, 08/22/2020   Pneumococcal Conjugate-13 07/13/2018     Objective: Vital Signs: BP 127/74 (BP Location: Left Arm, Patient Position: Sitting, Cuff Size: Normal)   Pulse 87   Resp 15   Ht 5\' 5"  (1.651 m)   Wt 169 lb (76.7 kg)   BMI 28.12 kg/m    Physical Exam Vitals and nursing note reviewed.  Constitutional:      Appearance: She is well-developed.  HENT:     Head: Normocephalic and atraumatic.  Eyes:     Conjunctiva/sclera: Conjunctivae normal.  Cardiovascular:     Rate and Rhythm: Normal rate and regular rhythm.     Heart sounds: Normal heart sounds.  Pulmonary:     Effort: Pulmonary effort is normal.     Breath sounds: Normal breath sounds.  Abdominal:     General: Bowel sounds are normal.     Palpations: Abdomen is soft.  Musculoskeletal:     Cervical back: Normal range of motion.  Lymphadenopathy:     Cervical: No cervical adenopathy.  Skin:    General: Skin is warm and dry.     Capillary Refill: Capillary refill takes less than 2 seconds.  Neurological:     Mental Status: She is alert and oriented to person, place, and time.  Psychiatric:        Behavior: Behavior normal.      Musculoskeletal Exam: C-spine has limited range of motion without rotation.  No midline spinal tenderness.  Shoulder joints, elbow joints,  and wrist joints have good range of motion with no tenderness or synovitis.  CMC prominence noted bilaterally.  PIP and DIP thickening consistent with osteoarthritis of both hands.  Some tenderness and mild inflammation noted in the right third PIP joint.  Complete fist formation bilaterally.  Hip joints have good range of motion with no groin pain.  Knee joints have good range of motion with no warmth or effusion.  Ankle joints have good range of motion with no tenderness or joint swelling.  Tailor bunions noted bilaterally.  Dorsal spurs noted bilaterally.  Right second hammertoe.  PIP and DIP thickening consistent with osteoarthritis of both feet noted.  CDAI Exam: CDAI Score: -- Patient Global: 3 mm; Provider Global: 3 mm Swollen: --; Tender: -- Joint Exam 05/19/2023   No joint exam has been documented for this visit   There is currently no information documented on the homunculus. Go to the Rheumatology activity and complete the homunculus joint exam.  Investigation: No additional findings.  Imaging: No results found.  Recent Labs: Lab Results  Component Value Date   WBC 7.1 04/08/2023   HGB 13.2 04/08/2023   PLT 179 04/08/2023   NA 136 04/08/2023   K 4.1 04/08/2023   CL 104 04/08/2023   CO2 24 04/08/2023   GLUCOSE 102 (H) 04/08/2023   BUN 21 04/08/2023   CREATININE 0.78 04/08/2023   BILITOT 0.8 04/08/2023   ALKPHOS 79 04/08/2023   AST 26 04/08/2023   ALT 23 04/08/2023   PROT 6.8 04/08/2023   ALBUMIN 4.0 04/08/2023   CALCIUM 9.4 04/08/2023   GFRAA >60 08/29/2020   QFTBGOLD Negative 03/11/2017   QFTBGOLDPLUS Negative 04/08/2023    Speciality Comments: Simply Aria IV-recurrent infections, Enbrel-insurance issues  Procedures:  No procedures performed Allergies: Tetanus toxoid   Assessment / Plan:     Visit Diagnoses: Rheumatoid arthritis involving multiple sites with positive rheumatoid factor (HCC) - +RF, +CCP, ANA-: Patient had a flare involving her right hand  this past weekend.  She was having pain and inflammation involving the MCP and PIP joints.  She has some residual inflammation in the right third PIP joint today.  She has been under tremendous amount of stress as well as has been grieving the loss of her husband.  She remains on IV Orencia infusions every 28 days.  Her most recent infusion was administered on 05/07/2023.  She continues to tolerate Orencia without any side effects.  She has not had any recent or recurrent infections. Patient had a chest CT updated on 04/29/2023 which revealed chronic bronchiectasis and pulmonary nodules.  She is currently awaiting an appointment at [facility] pulmonary for further evaluation.  Patient was advised to notify us if any medication changes are made or if any of the lung changes are related to underlying rheumatoid arthritis.  For now she will remain on Orencia as prescribed.  She is advised to notify us if she develops recurrent flares.  She will follow-up in the office in 3 months or sooner if needed.  High risk medication use - Orencia 750 mg IV infusions every 28 days.  Previous therapy: d/c Enbrel due to lack of insurance. D/c Simponi due to recurrent infections. CBC and CMP updated on 04/08/23. Her next lab work will be due in August and every 3 months.  TB gold negative on 04/08/23.  No recent or recurrent infections. Discussed the importance of holding orencia if she develops signs or symptoms of an infection and to resume once the infection has completley cleared.   Primary osteoarthritis of both hands: She has PIP and DIP thickening consistent with osteoarthritis of both hands.  She had a flare involving her right hand this past weekend consistent with rheumatoid arthritis.  She was having pain and inflammation involving her MCP and PIP joints.  She has some residual inflammation in the right third PIP joint.  Discussed the importance of joint protection and muscle strengthening.  She will notify us if she starts  to have recurrent flares.  Primary osteoarthritis of both knees: She has good range of motion of both knee joints on examination today.  No warmth or effusion noted.  Primary osteoarthritis of both feet: She has PIP and DIP thickening consistent with osteoarthritis of both hands.  Tailor bunions noted bilaterally.  Right second hammertoe noted.  Discussed the importance of wearing proper fitting shoes. She has found oofos to be very comfortable and supportive.   Spondylosis of lumbar region without myelopathy or radiculopathy: Not currently symptomatic.  Age-related osteoporosis without current pathological fracture -DEXA updated on 05/14/2023: AP spine BMD 0.902 with T-score -2.3.  DEXA results were discussed with the patient today in the office.  Discussed the importance of calcium and vitamin D supplementation.  She has been taking calcium and vitamin D supplement on a daily basis as recommended.  She has not had any recent falls or fractures. Previous DEXA on 03/11/2020 BMD measured at [facility] spine L1 through L4 0.928 with a T score of -2.   She has been walking 1 mile on a daily basis for exercise.  Discussed the importance of resistive exercises.  Postural kyphosis of thoracic region: No midline spinal tenderness in the thoracic region at this time.  Pulmonary nodules: Chest CT ordered on 04/29/2023: 6 mm right middle lobe nodule slow interval growth over prior exams--could represent slow-growing malignancy.  New 4 mm left upper lobe nodule noted. Chronic bronchiectasis in the lower lobes left greater than right.  Patient will be scheduled for an upcoming appointment at [facility] pulmonary.    Other medical conditions are listed as follows:  History of vertigo  History of kidney stones  History of migraine  Anxiety  History of IBS  History of gastroesophageal reflux (GERD)  History of anemia  History of hyperlipidemia  Former smoker  Orders: No orders of the defined types were  placed in this encounter.  No orders of the defined types were placed in this encounter.    Follow-Up Instructions: Return in 3 months (on 08/19/2023) for Rheumatoid arthritis, Osteoarthritis.   Gearldine Bienenstock, PA-C  Note - This record has been created using Dragon software.  Chart creation errors have been sought, but may not always  have been located. Such creation errors do not reflect on  the standard of medical care.

## 2023-05-07 ENCOUNTER — Encounter (HOSPITAL_COMMUNITY)
Admission: RE | Admit: 2023-05-07 | Discharge: 2023-05-07 | Disposition: A | Discharge status: Home / Self Care | Payer: Medicare Other | Source: Ambulatory Visit | Attending: Rheumatology | Admitting: Rheumatology

## 2023-05-07 VITALS — BP 130/49 | HR 62 | Temp 97.7°F | Resp 18 | Ht 65.0 in | Wt 165.0 lb

## 2023-05-07 DIAGNOSIS — M0579 Rheumatoid arthritis with rheumatoid factor of multiple sites without organ or systems involvement: Secondary | ICD-10-CM

## 2023-05-07 MED ORDER — SODIUM CHLORIDE 0.9 % IV SOLN
750.0000 mg | Freq: Once | INTRAVENOUS | Status: AC
  Administered 2023-05-07: 750 mg via INTRAVENOUS
  Filled 2023-05-07: qty 30

## 2023-05-07 MED ORDER — ACETAMINOPHEN 325 MG PO TABS: 650.0000 mg | ORAL_TABLET | Freq: Once | ORAL | Status: DC

## 2023-05-07 MED ORDER — DIPHENHYDRAMINE HCL 25 MG PO CAPS: 25.0000 mg | ORAL_CAPSULE | Freq: Once | ORAL | Status: DC

## 2023-05-07 NOTE — Addendum Note (Signed)
Encounter addended by: Wyvonne Lenz, RN on: 05/07/2023 12:36 PM  Actions taken: Therapy plan modified

## 2023-05-07 NOTE — Progress Notes (Signed)
Diagnosis: Rheumatoid Arthritis  Provider:  Pollyann Savoy MD  Procedure: Infusion  IV Type: Peripheral, IV Location: L Wrist  Orencia (Abatacept), Dose: 750 mg  Infusion Start time  1009 am  Infusion Stop Time: 1041 am  Post Infusion IV Care: Peripheral IV discontinued.  Discharge: Condition: Good, Destination: Home . AVS declined.  Performed by:  Wyvonne Lenz, RN

## 2023-05-10 DIAGNOSIS — R911 Solitary pulmonary nodule: Secondary | ICD-10-CM | POA: Diagnosis not present

## 2023-05-10 DIAGNOSIS — Z299 Encounter for prophylactic measures, unspecified: Secondary | ICD-10-CM | POA: Diagnosis not present

## 2023-05-10 DIAGNOSIS — J449 Chronic obstructive pulmonary disease, unspecified: Secondary | ICD-10-CM | POA: Diagnosis not present

## 2023-05-10 DIAGNOSIS — I10 Essential (primary) hypertension: Secondary | ICD-10-CM | POA: Diagnosis not present

## 2023-05-14 DIAGNOSIS — E2839 Other primary ovarian failure: Secondary | ICD-10-CM | POA: Diagnosis not present

## 2023-05-14 LAB — HM DEXA SCAN

## 2023-05-19 ENCOUNTER — Ambulatory Visit: Payer: Medicare Other | Attending: Physician Assistant | Admitting: Physician Assistant

## 2023-05-19 ENCOUNTER — Encounter: Payer: Self-pay | Admitting: Physician Assistant

## 2023-05-19 VITALS — BP 127/74 | HR 87 | Resp 15 | Ht 65.0 in | Wt 169.0 lb

## 2023-05-19 DIAGNOSIS — Z8669 Personal history of other diseases of the nervous system and sense organs: Secondary | ICD-10-CM

## 2023-05-19 DIAGNOSIS — M19041 Primary osteoarthritis, right hand: Secondary | ICD-10-CM | POA: Insufficient documentation

## 2023-05-19 DIAGNOSIS — M47816 Spondylosis without myelopathy or radiculopathy, lumbar region: Secondary | ICD-10-CM | POA: Diagnosis not present

## 2023-05-19 DIAGNOSIS — Z87442 Personal history of urinary calculi: Secondary | ICD-10-CM

## 2023-05-19 DIAGNOSIS — Z8639 Personal history of other endocrine, nutritional and metabolic disease: Secondary | ICD-10-CM | POA: Diagnosis not present

## 2023-05-19 DIAGNOSIS — M810 Age-related osteoporosis without current pathological fracture: Secondary | ICD-10-CM | POA: Diagnosis not present

## 2023-05-19 DIAGNOSIS — M19042 Primary osteoarthritis, left hand: Secondary | ICD-10-CM | POA: Diagnosis not present

## 2023-05-19 DIAGNOSIS — M19072 Primary osteoarthritis, left ankle and foot: Secondary | ICD-10-CM | POA: Diagnosis not present

## 2023-05-19 DIAGNOSIS — Z862 Personal history of diseases of the blood and blood-forming organs and certain disorders involving the immune mechanism: Secondary | ICD-10-CM | POA: Diagnosis not present

## 2023-05-19 DIAGNOSIS — M4004 Postural kyphosis, thoracic region: Secondary | ICD-10-CM

## 2023-05-19 DIAGNOSIS — M19071 Primary osteoarthritis, right ankle and foot: Secondary | ICD-10-CM

## 2023-05-19 DIAGNOSIS — Z79899 Other long term (current) drug therapy: Secondary | ICD-10-CM | POA: Diagnosis not present

## 2023-05-19 DIAGNOSIS — R918 Other nonspecific abnormal finding of lung field: Secondary | ICD-10-CM | POA: Diagnosis not present

## 2023-05-19 DIAGNOSIS — F419 Anxiety disorder, unspecified: Secondary | ICD-10-CM

## 2023-05-19 DIAGNOSIS — Z8719 Personal history of other diseases of the digestive system: Secondary | ICD-10-CM | POA: Diagnosis not present

## 2023-05-19 DIAGNOSIS — M170 Bilateral primary osteoarthritis of knee: Secondary | ICD-10-CM

## 2023-05-19 DIAGNOSIS — Z87898 Personal history of other specified conditions: Secondary | ICD-10-CM

## 2023-05-19 DIAGNOSIS — Z87891 Personal history of nicotine dependence: Secondary | ICD-10-CM

## 2023-05-19 DIAGNOSIS — M0579 Rheumatoid arthritis with rheumatoid factor of multiple sites without organ or systems involvement: Secondary | ICD-10-CM | POA: Diagnosis not present

## 2023-05-21 ENCOUNTER — Other Ambulatory Visit (HOSPITAL_COMMUNITY): Payer: Self-pay | Admitting: Internal Medicine

## 2023-05-21 DIAGNOSIS — Z1231 Encounter for screening mammogram for malignant neoplasm of breast: Secondary | ICD-10-CM

## 2023-05-25 DIAGNOSIS — B079 Viral wart, unspecified: Secondary | ICD-10-CM | POA: Diagnosis not present

## 2023-05-25 DIAGNOSIS — L570 Actinic keratosis: Secondary | ICD-10-CM | POA: Diagnosis not present

## 2023-05-31 ENCOUNTER — Telehealth: Payer: Self-pay | Admitting: Pharmacy Technician

## 2023-05-31 NOTE — Telephone Encounter (Signed)
Auth Submission: NO AUTH NEEDED Site of care: Site of care: [facility] Payer: medicare a/b & aarp Medication & CPT/J Code(s) submitted: Orencia (Abatacept) [id number] Route of submission (phone, fax, portal):  Phone # Fax # Auth type: Buy/Bill Units/visits requested: 750mg  [id number] Reference number:  Approval from: 05/31/23 to 12/07/23

## 2023-06-04 ENCOUNTER — Encounter (HOSPITAL_COMMUNITY)
Admission: RE | Admit: 2023-06-04 | Discharge: 2023-06-04 | Disposition: A | Discharge status: Home / Self Care | Payer: Medicare Other | Source: Ambulatory Visit | Attending: Rheumatology | Admitting: Rheumatology

## 2023-06-04 VITALS — BP 110/67 | HR 74 | Temp 98.1°F | Resp 18

## 2023-06-04 DIAGNOSIS — M0579 Rheumatoid arthritis with rheumatoid factor of multiple sites without organ or systems involvement: Secondary | ICD-10-CM | POA: Diagnosis not present

## 2023-06-04 LAB — COMPREHENSIVE METABOLIC PANEL
ALT: 30 U/L (ref 0–44)
AST: 32 U/L (ref 15–41)
Albumin: 4.1 g/dL (ref 3.5–5.0)
Alkaline Phosphatase: 80 U/L (ref 38–126)
Anion gap: 9 (ref 5–15)
BUN: 22 mg/dL (ref 8–23)
CO2: 23 mmol/L (ref 22–32)
Calcium: 9.2 mg/dL (ref 8.9–10.3)
Chloride: 105 mmol/L (ref 98–111)
Creatinine, Ser: 0.94 mg/dL (ref 0.44–1.00)
GFR, Estimated: >60 mL/min (ref >60)
Glucose, Bld: 85 mg/dL (ref 70–99)
Potassium: 4.2 mmol/L (ref 3.5–5.1)
Sodium: 137 mmol/L (ref 135–145)
Total Bilirubin: 0.5 mg/dL (ref 0.3–1.2)
Total Protein: 7.1 g/dL (ref 6.5–8.1)

## 2023-06-04 MED ORDER — ACETAMINOPHEN 325 MG PO TABS: 650.0000 mg | ORAL_TABLET | Freq: Once | ORAL | Status: DC

## 2023-06-04 MED ORDER — DIPHENHYDRAMINE HCL 25 MG PO CAPS: 25.0000 mg | ORAL_CAPSULE | Freq: Once | ORAL | Status: DC

## 2023-06-04 MED ORDER — SODIUM CHLORIDE 0.9 % IV SOLN
750.0000 mg | Freq: Once | INTRAVENOUS | Status: AC
  Administered 2023-06-04: 750 mg via INTRAVENOUS
  Filled 2023-06-04: qty 30

## 2023-06-04 NOTE — Progress Notes (Signed)
Diagnosis: Rheumatoid Arthritis  Provider:  Pollyann Savoy MD  Procedure: Infusion  IV Type: Peripheral, IV Location: L Wrist  Orencia (Abatacept), Dose: 750 mg  Infusion Start time    0943 am  Infusion Stop Time: 1015 am  Post Infusion IV Care: Peripheral IV discontinued.  Discharge: Condition: Good, Destination: Home . AVS given.   Performed by:  Arrie Senate, RN

## 2023-06-07 ENCOUNTER — Encounter (HOSPITAL_COMMUNITY)
Admission: RE | Admit: 2023-06-07 | Discharge: 2023-06-07 | Disposition: A | Discharge status: Home / Self Care | Payer: Medicare Other | Source: Ambulatory Visit | Attending: Rheumatology | Admitting: Rheumatology

## 2023-06-07 VITALS — BP 127/64 | HR 114 | Temp 97.7°F | Resp 18

## 2023-06-07 DIAGNOSIS — M0579 Rheumatoid arthritis with rheumatoid factor of multiple sites without organ or systems involvement: Secondary | ICD-10-CM | POA: Insufficient documentation

## 2023-06-07 DIAGNOSIS — Z7969 Long term (current) use of other immunomodulators and immunosuppressants: Secondary | ICD-10-CM | POA: Insufficient documentation

## 2023-06-07 LAB — CBC WITH DIFFERENTIAL/PLATELET
Abs Immature Granulocytes: 0.03 10*3/uL (ref 0.00–0.07)
Basophils Absolute: 0.1 10*3/uL (ref 0.0–0.1)
Basophils Relative: 1 %
Eosinophils Absolute: 0.3 10*3/uL (ref 0.0–0.5)
Eosinophils Relative: 3 %
HCT: 44.7 % (ref 36.0–46.0)
Hemoglobin: 14.1 g/dL (ref 12.0–15.0)
Immature Granulocytes: 0 %
Lymphocytes Relative: 23 %
Lymphs Abs: 2.2 10*3/uL (ref 0.7–4.0)
MCH: 27.8 pg (ref 26.0–34.0)
MCHC: 31.5 g/dL (ref 30.0–36.0)
MCV: 88 fL (ref 80.0–100.0)
Monocytes Absolute: 0.9 10*3/uL (ref 0.1–1.0)
Monocytes Relative: 9 %
Neutro Abs: 6 10*3/uL (ref 1.7–7.7)
Neutrophils Relative %: 64 %
Platelets: 218 10*3/uL (ref 150–400)
RBC: 5.08 MIL/uL (ref 3.87–5.11)
RDW: 14.7 % (ref 11.5–15.5)
WBC: 9.4 10*3/uL (ref 4.0–10.5)
nRBC: 0 % (ref 0.0–0.2)

## 2023-06-07 NOTE — Progress Notes (Signed)
Pt returned to have a CBC completed.  Venipuncture completed in right [facility] x1 attempt.  Tolerated well.

## 2023-06-08 NOTE — Progress Notes (Signed)
CBC is normal.

## 2023-06-09 DIAGNOSIS — R911 Solitary pulmonary nodule: Secondary | ICD-10-CM | POA: Diagnosis not present

## 2023-06-09 DIAGNOSIS — I700 Atherosclerosis of aorta: Secondary | ICD-10-CM | POA: Diagnosis not present

## 2023-06-09 DIAGNOSIS — J479 Bronchiectasis, uncomplicated: Secondary | ICD-10-CM | POA: Diagnosis not present

## 2023-06-09 DIAGNOSIS — Z299 Encounter for prophylactic measures, unspecified: Secondary | ICD-10-CM | POA: Diagnosis not present

## 2023-06-09 DIAGNOSIS — J449 Chronic obstructive pulmonary disease, unspecified: Secondary | ICD-10-CM | POA: Diagnosis not present

## 2023-06-09 DIAGNOSIS — I10 Essential (primary) hypertension: Secondary | ICD-10-CM | POA: Diagnosis not present

## 2023-06-21 NOTE — Progress Notes (Unsigned)
Synopsis: Referred in May 2022 for lung nodule by Kirstie Peri, MD  Subjective:   PATIENT ID: Tiffany Pollard GENDER: female DOB: 11-12-1951, MRN: [id number]  No chief complaint on file.   This is a 72 year old female, former history quit smoking in 2005, husband with stage IV lung cancer.  She also has rheumatoid arthritis on Orencia.  She has had multiple pulmonary nodules in the past.  Initial CT scan was in March 2020.  General right middle lobe small pulmonary nodule less than 4 mm.  It has slowly changed over time.  Is now 6 mm in size.  Has been stable since her 2021 CT scan also in March 2021.  Re[facility] at [facility] completed in March 2022 stable 6 mm nodule.  Also has other small pulmonary nodules.  CT scan also reveals areas of bronchial wall thickening and bronchiectasis.  We discussed her rheumatoid arthritis which is well controlled on her current medication regimen.  From a respiratory standpoint she has no complaints today here to establish care for follow-up regarding a pulmonary nodule.  OV 06/21/2023: Patient here today for follow up regardingPulmonary nodules.  Patient had CT imaging of the chest completed on 04/29/2023.  This revealed a 6 mm right middle lobe nodule that has shown some slow interval growth over time since 2022.  Could represent a slow-growing malignancy.  She also has a 4 mm left upper lobe pulm nodule that is new.  Other nodules are stable and considered benign.  She did have some areas of bronchiectasis and scarring in the left lower lobe.    Past Medical History:  Diagnosis Date   GERD (gastroesophageal reflux disease)    High blood pressure    Rheumatoid arthritis(714.0)      No family history on file.   Past Surgical History:  Procedure Laterality Date   BACK SURGERY     CERVICAL CONIZATION W/BX     COLONOSCOPY N/A 12/27/2013   Procedure: COLONOSCOPY;  Surgeon: Malissa Hippo, MD;  Location: [facility];  Service: Endoscopy;   Laterality: N/A;  320-moved to 125 Ann to notify pt   FINGER FRACTURE SURGERY     foot surgery for a bunion     NASAL SINUS SURGERY     TONSILLECTOMY      Social History   Socioeconomic History   Marital status: Widowed    Spouse name: Not on file   Number of children: Not on file   Years of education: Not on file   Highest education level: Not on file  Occupational History   Not on file  Tobacco Use   Smoking status: Former    Current packs/day: 0.00    Average packs/day: 1.3 packs/day for 30.0 years (37.5 ttl pk-yrs)    Types: Cigarettes    Start date: 01/04/1974    Quit date: 01/05/2004    Years since quitting: 19.4    Passive exposure: Never   Smokeless tobacco: Never  Vaping Use   Vaping status: Never Used  Substance and Sexual Activity   Alcohol use: No   Drug use: No   Sexual activity: Not on file  Other Topics Concern   Not on file  Social History Narrative   Not on file   Social Determinants of Health   Financial Resource Strain: Not on file  Food Insecurity: Not on file  Transportation Needs: Not on file  Physical Activity: Not on file  Stress: Not on file  Social  Connections: Not on file  Intimate Partner Violence: Not At Risk (01/06/2022)   [facility]   Humiliation, Afraid, Rape, and Kick questionnaire    Fear of Current or Ex-Partner: No    Emotionally Abused: No    Physically Abused: No    Sexually Abused: No     Allergies  Allergen Reactions   Tetanus Toxoid Swelling    Reaction at age 23.      Outpatient Medications Prior to Visit  Medication Sig Dispense Refill   Abatacept (ORENCIA IV) Inject 750 mg into the vein every 28 (twenty-eight) days.     acetaminophen (TYLENOL) 325 MG tablet Take 650 mg by mouth as needed.     ANORO ELLIPTA 62.5-25 MCG/INH AEPB daily.     Ascorbic Acid (VITAMIN C) 1000 MG tablet Take 1,000 mg by mouth daily.     aspirin 81 MG EC tablet Take 81 mg by mouth daily. Swallow  whole.     azelastine (ASTELIN) 0.1 % nasal spray USE 2 SPRAYS TWO TIMES DAILY  0   BREZTRI AEROSPHERE 160-9-4.8 MCG/ACT AERO Inhale into the lungs.     Calcium Carb-Cholecalciferol (CALCIUM 1000 + D PO)      Cetirizine HCl (ZYRTEC ALLERGY PO) Take by mouth.     diazepam (VALIUM) 5 MG tablet Take 5 mg by mouth 3 (three) times daily as needed.     diphenhydrAMINE (BENADRYL) 25 mg capsule Take 25 mg by mouth as needed. With Orencia infusions     famotidine (PEPCID) 20 MG tablet daily.     fish oil-omega-3 fatty acids 1000 MG capsule Take 2 g by mouth daily.     levocetirizine (XYZAL) 2.5 MG/5ML solution Take 2.5 mg by mouth daily. (Patient not taking: Reported on 07/10/2022)     lisinopril (PRINIVIL,ZESTRIL) 20 MG tablet Take 20 mg by mouth daily.     Multiple Vitamins-Minerals (MULTIVITAMIN PO) Take by mouth.     VENTOLIN HFA 108 (90 Base) MCG/ACT inhaler USE 2 PUFFS FOUR TIMES DAILY AS NEEDED  0   VITAMIN E PO Take 1 tablet by mouth daily.     No facility-administered medications prior to visit.    ROS   Objective:  Physical Exam   There were no vitals filed for this visit.    on RA BMI Readings from Last 3 Encounters:  05/19/23 28.12 kg/m  05/07/23 27.46 kg/m  04/08/23 28.84 kg/m   Wt Readings from Last 3 Encounters:  05/19/23 169 lb (76.7 kg)  05/07/23 165 lb (74.8 kg)  04/08/23 173 lb 4.5 oz (78.6 kg)     CBC    Component Value Date/Time   WBC 9.4 06/07/2023 0948   RBC 5.08 06/07/2023 0948   HGB 14.1 06/07/2023 0948   HGB 13.0 06/10/2017 0815   HCT 44.7 06/07/2023 0948   HCT 38.8 06/10/2017 0815   PLT 218 06/07/2023 0948   PLT 176 06/10/2017 0815   MCV 88.0 06/07/2023 0948   MCV 86 06/10/2017 0815   MCH 27.8 06/07/2023 0948   MCHC 31.5 06/07/2023 0948   RDW 14.7 06/07/2023 0948   RDW 14.2 06/10/2017 0815   LYMPHSABS 2.2 06/07/2023 0948   LYMPHSABS 1.4 06/10/2017 0815   MONOABS 0.9 06/07/2023 0948   EOSABS 0.3 06/07/2023 0948   EOSABS 0.4 06/10/2017  0815   BASOSABS 0.1 06/07/2023 0948   BASOSABS 0.0 06/10/2017 0815    Chest Imaging: 02/26/2021: CT chest completed at [facility] health Patient brought disc  with CT images today for review.  She has a 6 mm right middle lobe pulmonary nodule evidence of bronchiectasis other small scattered pulmonary nodules. The patient's images have been independently reviewed by me.    Pulmonary Functions Testing Results:     No data to display          FeNO:   Pathology:   Echocardiogram:   Heart Catheterization:     Assessment & Plan:     ICD-10-CM   1. Pulmonary nodules  R91.8        Discussion:  This is a 72 year old female former smoker quit in 2005.  She has been followed with annual screening CTs since 2020.  She initially had a lung nodule found in 2020.  She has had 3 separate CT scans each year in March 2020, 2021 and 2022.  I have reviewed the CT scans she has a stable right middle lobe pulmonary nodule however due to her smoking history and rheumatoid arthritis I think she would benefit from annual screening.  Plan: We will refer to our lung cancer screening program so she can continue annual low-dose CT screening instead of having a standard high-dose radiation exposure CT. Patient is agreeable to this plan. She also had a question of early bronchial pneumonia which she completed antibiotics for. No indication at this time for her to repeat images she is clinically stable and I think can restart her Orencia for RA management.  Patient to follow-up with [place] in 1 year after CT imaging.   Current Outpatient Medications:    Abatacept (ORENCIA IV), Inject 750 mg into the vein every 28 (twenty-eight) days., Disp: , Rfl:    acetaminophen (TYLENOL) 325 MG tablet, Take 650 mg by mouth as needed., Disp: , Rfl:    ANORO ELLIPTA 62.5-25 MCG/INH AEPB, daily., Disp: , Rfl:    Ascorbic Acid (VITAMIN C) 1000 MG tablet, Take 1,000 mg by mouth daily., Disp: , Rfl:    aspirin 81 MG EC tablet,  Take 81 mg by mouth daily. Swallow whole., Disp: , Rfl:    azelastine (ASTELIN) 0.1 % nasal spray, USE 2 SPRAYS TWO TIMES DAILY, Disp: , Rfl: 0   BREZTRI AEROSPHERE 160-9-4.8 MCG/ACT AERO, Inhale into the lungs., Disp: , Rfl:    Calcium Carb-Cholecalciferol (CALCIUM 1000 + D PO), , Disp: , Rfl:    Cetirizine HCl (ZYRTEC ALLERGY PO), Take by mouth., Disp: , Rfl:    diazepam (VALIUM) 5 MG tablet, Take 5 mg by mouth 3 (three) times daily as needed., Disp: , Rfl:    diphenhydrAMINE (BENADRYL) 25 mg capsule, Take 25 mg by mouth as needed. With Orencia infusions, Disp: , Rfl:    famotidine (PEPCID) 20 MG tablet, daily., Disp: , Rfl:    fish oil-omega-3 fatty acids 1000 MG capsule, Take 2 g by mouth daily., Disp: , Rfl:    levocetirizine (XYZAL) 2.5 MG/5ML solution, Take 2.5 mg by mouth daily. (Patient not taking: Reported on 07/10/2022), Disp: , Rfl:    lisinopril (PRINIVIL,ZESTRIL) 20 MG tablet, Take 20 mg by mouth daily., Disp: , Rfl:    Multiple Vitamins-Minerals (MULTIVITAMIN PO), Take by mouth., Disp: , Rfl:    VENTOLIN HFA 108 (90 Base) MCG/ACT inhaler, USE 2 PUFFS FOUR TIMES DAILY AS NEEDED, Disp: , Rfl: 0   VITAMIN E PO, Take 1 tablet by mouth daily., Disp: , Rfl:    Josephine Igo, DO [facility] 06/21/2023 5:51 PM

## 2023-06-22 ENCOUNTER — Telehealth: Payer: Self-pay | Admitting: *Deleted

## 2023-06-22 ENCOUNTER — Ambulatory Visit (INDEPENDENT_AMBULATORY_CARE_PROVIDER_SITE_OTHER): Payer: Medicare Other | Admitting: Pulmonary Disease

## 2023-06-22 VITALS — BP 140/60 | HR 91 | Ht 65.0 in | Wt 169.8 lb

## 2023-06-22 DIAGNOSIS — J479 Bronchiectasis, uncomplicated: Secondary | ICD-10-CM

## 2023-06-22 DIAGNOSIS — R918 Other nonspecific abnormal finding of lung field: Secondary | ICD-10-CM | POA: Diagnosis not present

## 2023-06-22 MED ORDER — PREDNISONE 10 MG PO TABS: ORAL_TABLET | ORAL | 0 refills | Status: DC

## 2023-06-22 NOTE — Patient Instructions (Addendum)
Thank you for visiting Dr. Tonia Brooms at [facility]. Today we recommend the following:  Orders Placed This Encounter  Procedures   CT CHEST HIGH RESOLUTION   Meds ordered this encounter  Medications   predniSONE (DELTASONE) 10 MG tablet    Sig: Take 4 tabs by mouth once daily x4 days, then 3 tabs x4 days, 2 tabs x4 days, 1 tab x4 days and stop.    Dispense:  40 tablet    Refill:  0   Return in about 6 months (around 12/23/2023) for with Kandice Robinsons, NP, after CT Chest.    Please do your part to reduce the spread of COVID-19.

## 2023-06-22 NOTE — Telephone Encounter (Signed)
Patient contacted the office and states she saw Dr. Tonia Brooms today who prescribed her a 12 day Prednisone taper. Patient states she has an Orencia infusion scheduled for next Friday. Patient wants to know if she can still have her infusion. Patient advised she may have her infusion next Friday.

## 2023-06-22 NOTE — Addendum Note (Signed)
Addended by: Hedda Slade on: 06/22/2023 09:58 AM   Modules accepted: Orders

## 2023-06-30 ENCOUNTER — Encounter (HOSPITAL_COMMUNITY): Payer: Self-pay

## 2023-06-30 ENCOUNTER — Ambulatory Visit (HOSPITAL_COMMUNITY)
Admission: RE | Admit: 2023-06-30 | Discharge: 2023-06-30 | Disposition: A | Discharge status: Home / Self Care | Payer: Medicare Other | Source: Ambulatory Visit | Attending: Internal Medicine | Admitting: Internal Medicine

## 2023-06-30 DIAGNOSIS — Z1231 Encounter for screening mammogram for malignant neoplasm of breast: Secondary | ICD-10-CM | POA: Insufficient documentation

## 2023-07-02 ENCOUNTER — Encounter (HOSPITAL_COMMUNITY)
Admission: RE | Admit: 2023-07-02 | Discharge: 2023-07-02 | Disposition: A | Discharge status: Home / Self Care | Payer: Medicare Other | Source: Ambulatory Visit | Attending: Rheumatology | Admitting: Rheumatology

## 2023-07-02 VITALS — BP 121/60 | HR 92 | Temp 97.9°F | Resp 18

## 2023-07-02 DIAGNOSIS — M0579 Rheumatoid arthritis with rheumatoid factor of multiple sites without organ or systems involvement: Secondary | ICD-10-CM | POA: Diagnosis not present

## 2023-07-02 DIAGNOSIS — Z7969 Long term (current) use of other immunomodulators and immunosuppressants: Secondary | ICD-10-CM | POA: Diagnosis not present

## 2023-07-02 MED ORDER — DIPHENHYDRAMINE HCL 25 MG PO CAPS
25.0000 mg | ORAL_CAPSULE | Freq: Once | ORAL | Status: AC
  Administered 2023-07-02: 25 mg via ORAL

## 2023-07-02 MED ORDER — ACETAMINOPHEN 325 MG PO TABS
650.0000 mg | ORAL_TABLET | Freq: Once | ORAL | Status: AC
  Administered 2023-07-02: 650 mg via ORAL

## 2023-07-02 MED ORDER — SODIUM CHLORIDE 0.9 % IV SOLN
750.0000 mg | Freq: Once | INTRAVENOUS | Status: AC
  Administered 2023-07-02: 750 mg via INTRAVENOUS
  Filled 2023-07-02: qty 30

## 2023-07-02 NOTE — Progress Notes (Signed)
Diagnosis: Rheumatoid Arthritis  Provider:  Pollyann Savoy MD  Procedure: Infusion  IV Type: Peripheral, IV Location: L Wrist  Orencia (Abatacept), Dose: 750 mg  Infusion Start time    0954 am  Infusion Stop Time: 1027 am  Post Infusion IV Care: Peripheral IV discontinued.  Discharge: Condition: Good, Destination: Home . AVS given.   Performed by:  Arrie Senate, RN

## 2023-07-05 ENCOUNTER — Ambulatory Visit: Payer: Medicare Other | Admitting: Pulmonary Disease

## 2023-07-10 ENCOUNTER — Telehealth: Payer: Self-pay | Admitting: Pharmacy Technician

## 2023-07-10 NOTE — Telephone Encounter (Signed)
Auth Submission: NO AUTH NEEDED Site of care: Site of care: [facility] Payer: MEDICARE A/B & AARP Medication & CPT/J Code(s) submitted: Orencia (Abatacept) [id number] Route of submission (phone, fax, portal):  Phone # Fax # Auth type: BUY/BILL HB Units/visits requested: 750 MG Q4WKS Reference number:  Approval from: 07/10/23 to 12/07/23

## 2023-07-28 DIAGNOSIS — B079 Viral wart, unspecified: Secondary | ICD-10-CM | POA: Diagnosis not present

## 2023-07-28 DIAGNOSIS — L570 Actinic keratosis: Secondary | ICD-10-CM | POA: Diagnosis not present

## 2023-07-30 ENCOUNTER — Inpatient Hospital Stay (HOSPITAL_COMMUNITY): Payer: Medicare Other

## 2023-07-30 ENCOUNTER — Other Ambulatory Visit: Payer: Self-pay

## 2023-07-30 ENCOUNTER — Encounter: Payer: Medicare Other | Attending: Rheumatology | Admitting: *Deleted

## 2023-07-30 VITALS — BP 122/54 | HR 68 | Temp 98.0°F | Resp 18

## 2023-07-30 DIAGNOSIS — M0579 Rheumatoid arthritis with rheumatoid factor of multiple sites without organ or systems involvement: Secondary | ICD-10-CM | POA: Insufficient documentation

## 2023-07-30 MED ORDER — SODIUM CHLORIDE 0.9 % IV SOLN
750.0000 mg | Freq: Once | INTRAVENOUS | Status: AC
  Administered 2023-07-30: 750 mg via INTRAVENOUS
  Filled 2023-07-30: qty 30

## 2023-07-30 MED ORDER — ACETAMINOPHEN 325 MG PO TABS: 650.0000 mg | ORAL_TABLET | Freq: Once | ORAL | Status: DC

## 2023-07-30 MED ORDER — DIPHENHYDRAMINE HCL 25 MG PO CAPS: 25.0000 mg | ORAL_CAPSULE | Freq: Once | ORAL | Status: DC

## 2023-07-30 NOTE — Progress Notes (Signed)
Diagnosis: Rheumatoid Arthritis  Provider:  Pollyann Savoy MD  Procedure: IV Infusion  IV Type: Peripheral, IV Location: L Forearm  Orencia (Abatacept), Dose: 750 mg  Infusion Start Time: 0956  Infusion Stop Time: 1030  Post Infusion IV Care: Observation period completed  Discharge: Condition: Good, Destination: Home . AVS Declined  Performed by:  Daleen Squibb, RN

## 2023-07-31 LAB — COMPREHENSIVE METABOLIC PANEL
AG Ratio: 2.1 (calc) (ref 1.0–2.5)
ALT: 27 U/L (ref 6–29)
AST: 33 U/L (ref 10–35)
Albumin: 4.6 g/dL (ref 3.6–5.1)
Alkaline phosphatase (APISO): 76 U/L (ref 37–153)
BUN: 18 mg/dL (ref 7–25)
CO2: 25 mmol/L (ref 20–32)
Calcium: 10.2 mg/dL (ref 8.6–10.4)
Chloride: 103 mmol/L (ref 98–110)
Creat: 0.91 mg/dL (ref 0.60–1.00)
Globulin: 2.2 g/dL (ref 1.9–3.7)
Glucose, Bld: 76 mg/dL (ref 65–99)
Potassium: 4.2 mmol/L (ref 3.5–5.3)
Sodium: 139 mmol/L (ref 135–146)
Total Bilirubin: 0.4 mg/dL (ref 0.2–1.2)
Total Protein: 6.8 g/dL (ref 6.1–8.1)

## 2023-07-31 LAB — CBC WITH DIFFERENTIAL/PLATELET
Absolute Monocytes: 740 {cells}/uL (ref 200–950)
Basophils Absolute: 52 {cells}/uL (ref 0–200)
Basophils Relative: 0.7 %
Eosinophils Absolute: 200 {cells}/uL (ref 15–500)
Eosinophils Relative: 2.7 %
HCT: 42.9 % (ref 35.0–45.0)
Hemoglobin: 14.4 g/dL (ref 11.7–15.5)
Lymphs Abs: 2072 {cells}/uL (ref 850–3900)
MCH: 28.4 pg (ref 27.0–33.0)
MCHC: 33.6 g/dL (ref 32.0–36.0)
MCV: 84.6 fL (ref 80.0–100.0)
MPV: 9.9 fL (ref 7.5–12.5)
Monocytes Relative: 10 %
Neutro Abs: 4336 {cells}/uL (ref 1500–7800)
Neutrophils Relative %: 58.6 %
Platelets: 222 10*3/uL (ref 140–400)
RBC: 5.07 10*6/uL (ref 3.80–5.10)
RDW: 13.4 % (ref 11.0–15.0)
Total Lymphocyte: 28 %
WBC: 7.4 10*3/uL (ref 3.8–10.8)

## 2023-08-01 NOTE — Progress Notes (Signed)
CBC and CMP are normal.
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
## 2023-08-18 DIAGNOSIS — I10 Essential (primary) hypertension: Secondary | ICD-10-CM | POA: Diagnosis not present

## 2023-08-18 DIAGNOSIS — J479 Bronchiectasis, uncomplicated: Secondary | ICD-10-CM | POA: Diagnosis not present

## 2023-08-18 DIAGNOSIS — J449 Chronic obstructive pulmonary disease, unspecified: Secondary | ICD-10-CM | POA: Diagnosis not present

## 2023-08-18 DIAGNOSIS — Z299 Encounter for prophylactic measures, unspecified: Secondary | ICD-10-CM | POA: Diagnosis not present

## 2023-08-18 DIAGNOSIS — M069 Rheumatoid arthritis, unspecified: Secondary | ICD-10-CM | POA: Diagnosis not present

## 2023-08-23 ENCOUNTER — Encounter: Payer: Self-pay | Admitting: Physician Assistant

## 2023-08-23 ENCOUNTER — Encounter: Payer: Medicare Other | Attending: Rheumatology | Admitting: Physician Assistant

## 2023-08-23 VITALS — BP 109/67 | HR 84 | Resp 14 | Ht 65.0 in | Wt 168.0 lb

## 2023-08-23 DIAGNOSIS — Z862 Personal history of diseases of the blood and blood-forming organs and certain disorders involving the immune mechanism: Secondary | ICD-10-CM

## 2023-08-23 DIAGNOSIS — M0579 Rheumatoid arthritis with rheumatoid factor of multiple sites without organ or systems involvement: Secondary | ICD-10-CM

## 2023-08-23 DIAGNOSIS — M19041 Primary osteoarthritis, right hand: Secondary | ICD-10-CM

## 2023-08-23 DIAGNOSIS — Z87891 Personal history of nicotine dependence: Secondary | ICD-10-CM | POA: Diagnosis not present

## 2023-08-23 DIAGNOSIS — Z79899 Other long term (current) drug therapy: Secondary | ICD-10-CM | POA: Diagnosis not present

## 2023-08-23 DIAGNOSIS — M19042 Primary osteoarthritis, left hand: Secondary | ICD-10-CM | POA: Diagnosis not present

## 2023-08-23 DIAGNOSIS — Z8669 Personal history of other diseases of the nervous system and sense organs: Secondary | ICD-10-CM

## 2023-08-23 DIAGNOSIS — M19072 Primary osteoarthritis, left ankle and foot: Secondary | ICD-10-CM | POA: Diagnosis not present

## 2023-08-23 DIAGNOSIS — M170 Bilateral primary osteoarthritis of knee: Secondary | ICD-10-CM | POA: Diagnosis not present

## 2023-08-23 DIAGNOSIS — Z87442 Personal history of urinary calculi: Secondary | ICD-10-CM

## 2023-08-23 DIAGNOSIS — M47816 Spondylosis without myelopathy or radiculopathy, lumbar region: Secondary | ICD-10-CM | POA: Diagnosis not present

## 2023-08-23 DIAGNOSIS — R918 Other nonspecific abnormal finding of lung field: Secondary | ICD-10-CM

## 2023-08-23 DIAGNOSIS — F419 Anxiety disorder, unspecified: Secondary | ICD-10-CM | POA: Diagnosis not present

## 2023-08-23 DIAGNOSIS — M4004 Postural kyphosis, thoracic region: Secondary | ICD-10-CM | POA: Diagnosis not present

## 2023-08-23 DIAGNOSIS — M19071 Primary osteoarthritis, right ankle and foot: Secondary | ICD-10-CM | POA: Diagnosis not present

## 2023-08-23 DIAGNOSIS — M810 Age-related osteoporosis without current pathological fracture: Secondary | ICD-10-CM

## 2023-08-23 DIAGNOSIS — J479 Bronchiectasis, uncomplicated: Secondary | ICD-10-CM | POA: Diagnosis not present

## 2023-08-23 DIAGNOSIS — Z8639 Personal history of other endocrine, nutritional and metabolic disease: Secondary | ICD-10-CM | POA: Diagnosis not present

## 2023-08-23 DIAGNOSIS — Z87898 Personal history of other specified conditions: Secondary | ICD-10-CM | POA: Diagnosis not present

## 2023-08-23 DIAGNOSIS — Z8719 Personal history of other diseases of the digestive system: Secondary | ICD-10-CM

## 2023-08-27 ENCOUNTER — Encounter: Payer: Medicare Other | Admitting: *Deleted

## 2023-08-27 VITALS — BP 123/58 | HR 74 | Temp 98.0°F | Resp 16

## 2023-08-27 DIAGNOSIS — M19071 Primary osteoarthritis, right ankle and foot: Secondary | ICD-10-CM | POA: Diagnosis not present

## 2023-08-27 DIAGNOSIS — M170 Bilateral primary osteoarthritis of knee: Secondary | ICD-10-CM | POA: Diagnosis not present

## 2023-08-27 DIAGNOSIS — Z79899 Other long term (current) drug therapy: Secondary | ICD-10-CM | POA: Diagnosis not present

## 2023-08-27 DIAGNOSIS — M0579 Rheumatoid arthritis with rheumatoid factor of multiple sites without organ or systems involvement: Secondary | ICD-10-CM

## 2023-08-27 DIAGNOSIS — M19042 Primary osteoarthritis, left hand: Secondary | ICD-10-CM | POA: Diagnosis not present

## 2023-08-27 DIAGNOSIS — M19041 Primary osteoarthritis, right hand: Secondary | ICD-10-CM | POA: Diagnosis not present

## 2023-08-27 MED ORDER — ACETAMINOPHEN 325 MG PO TABS: 650.0000 mg | ORAL_TABLET | Freq: Once | ORAL | Status: DC

## 2023-08-27 MED ORDER — SODIUM CHLORIDE 0.9 % IV SOLN
750.0000 mg | Freq: Once | INTRAVENOUS | Status: AC
  Administered 2023-08-27: 750 mg via INTRAVENOUS
  Filled 2023-08-27: qty 30

## 2023-08-27 MED ORDER — DIPHENHYDRAMINE HCL 25 MG PO CAPS: 25.0000 mg | ORAL_CAPSULE | Freq: Once | ORAL | Status: DC

## 2023-08-27 NOTE — Progress Notes (Signed)
Diagnosis: Rheumatoid Arthritis  Provider:  Pollyann Savoy MD  Procedure: IV Infusion  IV Type: Peripheral, IV Location: L Forearm  Orencia (Abatacept), Dose: 750 mg  Infusion Start Time: 0941  Infusion Stop Time: 1020  Post Infusion IV Care: Observation period completed  Discharge: Condition: Good, Destination: Home . AVS Provided  Performed by:  Daleen Squibb, RN

## 2023-09-09 DIAGNOSIS — J449 Chronic obstructive pulmonary disease, unspecified: Secondary | ICD-10-CM | POA: Diagnosis not present

## 2023-09-09 DIAGNOSIS — Z299 Encounter for prophylactic measures, unspecified: Secondary | ICD-10-CM | POA: Diagnosis not present

## 2023-09-09 DIAGNOSIS — J029 Acute pharyngitis, unspecified: Secondary | ICD-10-CM | POA: Diagnosis not present

## 2023-09-09 DIAGNOSIS — J209 Acute bronchitis, unspecified: Secondary | ICD-10-CM | POA: Diagnosis not present

## 2023-09-09 DIAGNOSIS — J020 Streptococcal pharyngitis: Secondary | ICD-10-CM | POA: Diagnosis not present

## 2023-09-15 DIAGNOSIS — R059 Cough, unspecified: Secondary | ICD-10-CM | POA: Diagnosis not present

## 2023-09-15 DIAGNOSIS — R0981 Nasal congestion: Secondary | ICD-10-CM | POA: Diagnosis not present

## 2023-09-15 DIAGNOSIS — M069 Rheumatoid arthritis, unspecified: Secondary | ICD-10-CM | POA: Diagnosis not present

## 2023-09-15 DIAGNOSIS — Z299 Encounter for prophylactic measures, unspecified: Secondary | ICD-10-CM | POA: Diagnosis not present

## 2023-09-15 DIAGNOSIS — J020 Streptococcal pharyngitis: Secondary | ICD-10-CM | POA: Diagnosis not present

## 2023-09-16 DIAGNOSIS — R1110 Vomiting, unspecified: Secondary | ICD-10-CM | POA: Diagnosis not present

## 2023-09-16 DIAGNOSIS — Z299 Encounter for prophylactic measures, unspecified: Secondary | ICD-10-CM | POA: Diagnosis not present

## 2023-09-16 DIAGNOSIS — J020 Streptococcal pharyngitis: Secondary | ICD-10-CM | POA: Diagnosis not present

## 2023-09-24 ENCOUNTER — Ambulatory Visit: Payer: Medicare Other

## 2023-09-28 ENCOUNTER — Telehealth: Payer: Self-pay | Admitting: Internal Medicine

## 2023-10-01 ENCOUNTER — Other Ambulatory Visit: Payer: Self-pay | Admitting: Emergency Medicine

## 2023-10-01 ENCOUNTER — Encounter: Payer: Medicare Other | Attending: Rheumatology | Admitting: Internal Medicine

## 2023-10-01 VITALS — BP 137/55 | HR 94 | Temp 97.6°F | Resp 18

## 2023-10-01 DIAGNOSIS — M0579 Rheumatoid arthritis with rheumatoid factor of multiple sites without organ or systems involvement: Secondary | ICD-10-CM

## 2023-10-01 DIAGNOSIS — Z7969 Long term (current) use of other immunomodulators and immunosuppressants: Secondary | ICD-10-CM | POA: Diagnosis not present

## 2023-10-01 MED ORDER — ABATACEPT 250 MG IV SOLR
750.0000 mg | Freq: Once | INTRAVENOUS | Status: AC
  Administered 2023-10-01: 750 mg via INTRAVENOUS
  Filled 2023-10-01: qty 30

## 2023-10-01 NOTE — Progress Notes (Signed)
Diagnosis: Rheumatoid Arthritis  Provider:  Pollyann Savoy MD  Procedure: IV Infusion  IV Type: Peripheral, IV Location: L Hand  Orencia (Abatacept), Dose: 750 mg  Infusion Start Time: 1009  Infusion Stop Time: 1041  Post Infusion IV Care: Observation period completed  Discharge: Condition: Good, Destination: Home . AVS Provided  Performed by:  Feliberto Harts, LPN

## 2023-10-02 LAB — CBC WITH DIFFERENTIAL/PLATELET
Absolute Lymphocytes: 2120 {cells}/uL (ref 850–3900)
Absolute Monocytes: 654 {cells}/uL (ref 200–950)
Basophils Absolute: 53 {cells}/uL (ref 0–200)
Basophils Relative: 0.7 %
Eosinophils Absolute: 198 {cells}/uL (ref 15–500)
Eosinophils Relative: 2.6 %
HCT: 40.7 % (ref 35.0–45.0)
Hemoglobin: 13.4 g/dL (ref 11.7–15.5)
MCH: 28.4 pg (ref 27.0–33.0)
MCHC: 32.9 g/dL (ref 32.0–36.0)
MCV: 86.2 fL (ref 80.0–100.0)
MPV: 10.2 fL (ref 7.5–12.5)
Monocytes Relative: 8.6 %
Neutro Abs: 4575 {cells}/uL (ref 1500–7800)
Neutrophils Relative %: 60.2 %
Platelets: 232 10*3/uL (ref 140–400)
RBC: 4.72 10*6/uL (ref 3.80–5.10)
RDW: 13 % (ref 11.0–15.0)
Total Lymphocyte: 27.9 %
WBC: 7.6 10*3/uL (ref 3.8–10.8)

## 2023-10-02 LAB — COMPREHENSIVE METABOLIC PANEL
AG Ratio: 1.9 (calc) (ref 1.0–2.5)
ALT: 17 U/L (ref 6–29)
AST: 16 U/L (ref 10–35)
Albumin: 4.4 g/dL (ref 3.6–5.1)
Alkaline phosphatase (APISO): 74 U/L (ref 37–153)
BUN: 19 mg/dL (ref 7–25)
CO2: 24 mmol/L (ref 20–32)
Calcium: 9.9 mg/dL (ref 8.6–10.4)
Chloride: 105 mmol/L (ref 98–110)
Creat: 0.88 mg/dL (ref 0.60–1.00)
Globulin: 2.3 g/dL (ref 1.9–3.7)
Glucose, Bld: 91 mg/dL (ref 65–99)
Potassium: 4.3 mmol/L (ref 3.5–5.3)
Sodium: 138 mmol/L (ref 135–146)
Total Bilirubin: 0.3 mg/dL (ref 0.2–1.2)
Total Protein: 6.7 g/dL (ref 6.1–8.1)

## 2023-10-03 NOTE — Progress Notes (Signed)
CBC and CMP are normal.

## 2023-10-29 ENCOUNTER — Encounter: Payer: Medicare Other | Attending: Rheumatology | Admitting: Emergency Medicine

## 2023-10-29 VITALS — BP 125/60 | HR 68 | Temp 97.8°F | Resp 16

## 2023-10-29 DIAGNOSIS — M0579 Rheumatoid arthritis with rheumatoid factor of multiple sites without organ or systems involvement: Secondary | ICD-10-CM | POA: Insufficient documentation

## 2023-10-29 MED ORDER — ABATACEPT 250 MG IV SOLR
750.0000 mg | Freq: Once | INTRAVENOUS | Status: AC
  Administered 2023-10-29: 750 mg via INTRAVENOUS
  Filled 2023-10-29: qty 30

## 2023-10-29 NOTE — Progress Notes (Signed)
Diagnosis: Rheumatoid Arthritis  Provider:  Pollyann Savoy MD  Procedure: IV Infusion  IV Type: Peripheral, IV Location: L Antecubital  Orencia (Abatacept), Dose: 750 mg  Infusion Start Time: 0919  Infusion Stop Time: 0955  Post Infusion IV Care: peripheral IV discontinued   Discharge: Condition: Good, Destination: Home . AVS Provided  Performed by:  Arrie Senate, RN

## 2023-10-30 DIAGNOSIS — Z23 Encounter for immunization: Secondary | ICD-10-CM | POA: Diagnosis not present

## 2023-11-12 DIAGNOSIS — B379 Candidiasis, unspecified: Secondary | ICD-10-CM | POA: Diagnosis not present

## 2023-11-12 DIAGNOSIS — R509 Fever, unspecified: Secondary | ICD-10-CM | POA: Diagnosis not present

## 2023-11-12 DIAGNOSIS — R059 Cough, unspecified: Secondary | ICD-10-CM | POA: Diagnosis not present

## 2023-11-15 DIAGNOSIS — Z7189 Other specified counseling: Secondary | ICD-10-CM | POA: Diagnosis not present

## 2023-11-15 DIAGNOSIS — E559 Vitamin D deficiency, unspecified: Secondary | ICD-10-CM | POA: Diagnosis not present

## 2023-11-15 DIAGNOSIS — Z0000 Encounter for general adult medical examination without abnormal findings: Secondary | ICD-10-CM | POA: Diagnosis not present

## 2023-11-15 DIAGNOSIS — R5383 Other fatigue: Secondary | ICD-10-CM | POA: Diagnosis not present

## 2023-11-15 DIAGNOSIS — Z299 Encounter for prophylactic measures, unspecified: Secondary | ICD-10-CM | POA: Diagnosis not present

## 2023-11-15 DIAGNOSIS — Z1339 Encounter for screening examination for other mental health and behavioral disorders: Secondary | ICD-10-CM | POA: Diagnosis not present

## 2023-11-15 DIAGNOSIS — R52 Pain, unspecified: Secondary | ICD-10-CM | POA: Diagnosis not present

## 2023-11-15 DIAGNOSIS — Z79899 Other long term (current) drug therapy: Secondary | ICD-10-CM | POA: Diagnosis not present

## 2023-11-15 DIAGNOSIS — Z1331 Encounter for screening for depression: Secondary | ICD-10-CM | POA: Diagnosis not present

## 2023-11-15 DIAGNOSIS — I10 Essential (primary) hypertension: Secondary | ICD-10-CM | POA: Diagnosis not present

## 2023-11-15 DIAGNOSIS — E7800 Pure hypercholesterolemia, unspecified: Secondary | ICD-10-CM | POA: Diagnosis not present

## 2023-11-26 ENCOUNTER — Other Ambulatory Visit: Payer: Self-pay | Admitting: Emergency Medicine

## 2023-11-26 ENCOUNTER — Encounter: Payer: Medicare Other | Attending: Rheumatology | Admitting: Internal Medicine

## 2023-11-26 VITALS — BP 127/53 | HR 92 | Temp 97.9°F | Resp 18

## 2023-11-26 DIAGNOSIS — M0579 Rheumatoid arthritis with rheumatoid factor of multiple sites without organ or systems involvement: Secondary | ICD-10-CM | POA: Diagnosis not present

## 2023-11-26 MED ORDER — SODIUM CHLORIDE 0.9 % IV SOLN
750.0000 mg | Freq: Once | INTRAVENOUS | Status: AC
  Administered 2023-11-26: 750 mg via INTRAVENOUS
  Filled 2023-11-26: qty 30

## 2023-11-26 MED ORDER — DIPHENHYDRAMINE HCL 25 MG PO CAPS
25.0000 mg | ORAL_CAPSULE | Freq: Once | ORAL | Status: DC
Start: 2023-11-26 — End: 2023-11-26

## 2023-11-26 MED ORDER — ACETAMINOPHEN 325 MG PO TABS
650.0000 mg | ORAL_TABLET | Freq: Once | ORAL | Status: DC
Start: 2023-11-26 — End: 2023-11-26

## 2023-11-26 NOTE — Progress Notes (Signed)
Diagnosis: Rheumatoid Arthritis  Provider:  Pollyann Savoy MD  Procedure: IV Infusion  IV Type: Peripheral, IV Location: R Antecubital  Orencia (Abatacept), Dose: 750 mg  Infusion Start Time: 0924  Infusion Stop Time: 0958  Post Infusion IV Care: Observation period completed  Discharge: Condition: Good, Destination: Home . AVS Provided  Performed by:  Cleotilde Neer, LPN

## 2023-11-27 LAB — CBC WITH DIFFERENTIAL/PLATELET
Absolute Lymphocytes: 2083 {cells}/uL (ref 850–3900)
Absolute Monocytes: 741 {cells}/uL (ref 200–950)
Basophils Absolute: 62 {cells}/uL (ref 0–200)
Basophils Relative: 0.8 %
Eosinophils Absolute: 491 {cells}/uL (ref 15–500)
Eosinophils Relative: 6.3 %
HCT: 43.9 % (ref 35.0–45.0)
Hemoglobin: 14.3 g/dL (ref 11.7–15.5)
MCH: 28.5 pg (ref 27.0–33.0)
MCHC: 32.6 g/dL (ref 32.0–36.0)
MCV: 87.6 fL (ref 80.0–100.0)
MPV: 9.9 fL (ref 7.5–12.5)
Monocytes Relative: 9.5 %
Neutro Abs: 4423 {cells}/uL (ref 1500–7800)
Neutrophils Relative %: 56.7 %
Platelets: 216 10*3/uL (ref 140–400)
RBC: 5.01 10*6/uL (ref 3.80–5.10)
RDW: 12.7 % (ref 11.0–15.0)
Total Lymphocyte: 26.7 %
WBC: 7.8 10*3/uL (ref 3.8–10.8)

## 2023-11-27 LAB — COMPREHENSIVE METABOLIC PANEL
AG Ratio: 1.9 (calc) (ref 1.0–2.5)
ALT: 24 U/L (ref 6–29)
AST: 23 U/L (ref 10–35)
Albumin: 4.6 g/dL (ref 3.6–5.1)
Alkaline phosphatase (APISO): 82 U/L (ref 37–153)
BUN: 18 mg/dL (ref 7–25)
CO2: 23 mmol/L (ref 20–32)
Calcium: 9.7 mg/dL (ref 8.6–10.4)
Chloride: 104 mmol/L (ref 98–110)
Creat: 0.93 mg/dL (ref 0.60–1.00)
Globulin: 2.4 g/dL (ref 1.9–3.7)
Glucose, Bld: 69 mg/dL (ref 65–99)
Potassium: 4.4 mmol/L (ref 3.5–5.3)
Sodium: 140 mmol/L (ref 135–146)
Total Bilirubin: 0.4 mg/dL (ref 0.2–1.2)
Total Protein: 7 g/dL (ref 6.1–8.1)

## 2023-11-28 NOTE — Progress Notes (Signed)
CBC and CMP are normal.

## 2023-12-03 ENCOUNTER — Telehealth: Payer: Self-pay | Admitting: Pharmacist

## 2023-12-03 NOTE — Telephone Encounter (Signed)
Patient has Medicare A/B and AARP supplement. Patient confirms that she is planning on keeping supplemental plan and anticipates no insurance changes in 2025.   Medication: IV Orencia   Supplemental plan follows Medicare guidelines. Pre-certification is not required for Part B drugs. Patient will be responsible for Part B deductible. Medicare will cover 80% of cost of infusion and the supplement will pick up the 20% for which the patient would be responsible.   Chesley Mires, PharmD, MPH, BCPS, CPP Clinical Pharmacist (Rheumatology and Pulmonology)

## 2023-12-06 DIAGNOSIS — B079 Viral wart, unspecified: Secondary | ICD-10-CM | POA: Diagnosis not present

## 2023-12-10 ENCOUNTER — Ambulatory Visit (HOSPITAL_BASED_OUTPATIENT_CLINIC_OR_DEPARTMENT_OTHER)
Admission: RE | Admit: 2023-12-10 | Discharge: 2023-12-10 | Disposition: A | Discharge status: Home / Self Care | Payer: Medicare Other | Source: Ambulatory Visit | Attending: Pulmonary Disease | Admitting: Pulmonary Disease

## 2023-12-10 DIAGNOSIS — R918 Other nonspecific abnormal finding of lung field: Secondary | ICD-10-CM | POA: Diagnosis not present

## 2023-12-10 DIAGNOSIS — J479 Bronchiectasis, uncomplicated: Secondary | ICD-10-CM | POA: Diagnosis not present

## 2023-12-10 DIAGNOSIS — J8410 Pulmonary fibrosis, unspecified: Secondary | ICD-10-CM | POA: Diagnosis not present

## 2023-12-13 ENCOUNTER — Other Ambulatory Visit: Payer: Self-pay

## 2023-12-13 ENCOUNTER — Telehealth: Payer: Self-pay

## 2023-12-13 NOTE — Telephone Encounter (Signed)
 Auth Submission: NO AUTH NEEDED Site of care: Site of care: [facility] Payer: medicare a/b, aarp supp Medication & CPT/J Code(s) submitted: Orencia  (Abatacept ) G9870 Route of submission (phone, fax, portal): portal Phone # Fax # Auth type: Buy/Bill HB Units/visits requested: 750mg , q4weeks Reference number:  Approval from: 12/13/23 to 12/06/24

## 2023-12-24 ENCOUNTER — Encounter: Payer: Medicare Other | Attending: Rheumatology | Admitting: Internal Medicine

## 2023-12-24 VITALS — BP 129/60 | HR 73 | Temp 98.0°F | Resp 16

## 2023-12-24 DIAGNOSIS — M0579 Rheumatoid arthritis with rheumatoid factor of multiple sites without organ or systems involvement: Secondary | ICD-10-CM

## 2023-12-24 MED ORDER — SODIUM CHLORIDE 0.9 % IV SOLN
750.0000 mg | INTRAVENOUS | Status: DC
  Administered 2023-12-24: 750 mg via INTRAVENOUS
  Filled 2023-12-24: qty 30

## 2023-12-24 NOTE — Progress Notes (Signed)
Diagnosis: Rheumatoid Arthritis  Provider:  Pollyann Savoy MD  Procedure: IV Infusion  IV Type: Peripheral, IV Location: L Antecubital  Orencia (Abatacept), Dose: 750 mg  Infusion Start Time: 0958  Infusion Stop Time: 1028  Post Infusion IV Care: Observation period completed  Discharge: Condition: Good, Destination: Home . AVS Provided  Performed by:  Cleotilde Neer, LPN

## 2023-12-27 ENCOUNTER — Encounter: Payer: Self-pay | Admitting: Rheumatology

## 2023-12-28 ENCOUNTER — Encounter: Payer: Self-pay | Admitting: Acute Care

## 2023-12-28 ENCOUNTER — Ambulatory Visit (INDEPENDENT_AMBULATORY_CARE_PROVIDER_SITE_OTHER): Payer: Medicare Other | Admitting: Acute Care

## 2023-12-28 VITALS — BP 148/74 | HR 88 | Ht 65.0 in | Wt 172.8 lb

## 2023-12-28 DIAGNOSIS — D849 Immunodeficiency, unspecified: Secondary | ICD-10-CM | POA: Diagnosis not present

## 2023-12-28 DIAGNOSIS — R911 Solitary pulmonary nodule: Secondary | ICD-10-CM

## 2023-12-28 DIAGNOSIS — Z87891 Personal history of nicotine dependence: Secondary | ICD-10-CM | POA: Diagnosis not present

## 2023-12-28 DIAGNOSIS — J479 Bronchiectasis, uncomplicated: Secondary | ICD-10-CM | POA: Diagnosis not present

## 2023-12-28 NOTE — Patient Instructions (Addendum)
It is good to see you today. Your CT chest shows stable nodules, but with your RA and immunosuppressive medications in addition to  your smoking history, we will continue annual scans.  Next scan will be due January 2026.  You will get a call closer to the time to get this scheduled.  We will need to assign you another primary MD in the practice.  We will try to get you scheduled with Dr. Marchelle Gearing to follow up after your next CT in 12/2024. Continue triple therapy inhaler, with spacer. Continue flutter valve and aggressive pulmonary toilet. Consider plain Mucinex as a mucolytic for thick secretions . Discussed trying to  get her back to a LAMA/LABA in the future to decrease exposure to ICS with bronchiectasis, as no frequent infections will continue with [facility] for now.Molli Knock to continue Orencia at this time for her RA management, but will need to reconsider if more frequent exacerbations or pneumonias.. If she has recurrent problems with infections we may need to consider new  treatment options , we can partner with her rheumatologist to determine best options if necessary. Call for any unexplained weight loss, blood in sputum, or cough that does resolve. Let me know if you would like a referral to healthy weight and wellness.  Please contact office for sooner follow up if symptoms do not improve or worsen or seek emergency care

## 2023-12-28 NOTE — Progress Notes (Signed)
History of Present Illness Tiffany Pollard is a 73 y.o. female former smoker, quit in 2005 with a 30 pack year smoking history,  with RA, on Orencia., and followed by Dr. Tonia Brooms for surveillance of a lung nodule, and bronchiectasis.  Synopsis 73 year old female, former history quit smoking in 2005, husband with stage IV lung cancer.  She also has rheumatoid arthritis on Orencia.  She has had multiple pulmonary nodules in the past.  Initial CT scan was in March 2020.  General right middle lobe small pulmonary nodule less than 4 mm.  It has slowly changed over time.  Is now 6 mm in size.  Has been stable since her 2021 CT scan also in March 2021.  Repeat CT scan at [facility] completed in March 2022 stable 6 mm nodule.  Also has other small pulmonary nodules.  CT scan also reveals areas of bronchial wall thickening and bronchiectasis.  We discussed her rheumatoid arthritis which is well controlled on her current medication regimen.  From a respiratory standpoint she has no complaints today here to establish care for follow-up regarding a pulmonary nodule.She was seen by Dr. Tonia Brooms 06/22/2023, at that time plan was for a 6 month follow up Ct Chest.She was also treated with a prednisone taper for a mild flare of her bronchiectasis.  She is here today to follow up on her imaging.      12/28/2023 Pt. Presents for follow up. She states she has been doing well since her last visit. She has had no further flares of her bronchiectasis.We have reviewed her CT Chest which show asymmetrically distributed bronchiectasis, similar to 2019 study favored to reflect areas of chronic post infectious or inflammatory scarring. There are multiple small pulmonary nodules grossly stable compared to prior study from 2019, considered benign.There was also notation of left main and 2 vessel CAD. She is on Crestor, which I believe is being managed by her PCP.  She states she does have some ongoing chest congestion , which  does not bother her. She says she does not have a productive cough. She is using a flutter valve twice daily. She is using her Breztri daily. She is using a treadmill at home every day 20 minutes at a time. She states she feels physically stronger.She wants to lose weight. She has deferred on Health weight and wellness consult for now , but will let us know if she changes her mind. She is sleeping better. Her husband died 2023/02/22, and she is finally feeling like she is recovering from his illness. He was a patient of Dr. Marchelle Gearing.   Test Results: CT Chest 12/10/2023 Bilateral apical nodular pleuroparenchymal thickening and architectural distortion is again noted, mild and similar to the prior study, most compatible with areas of post infectious or inflammatory scarring. A few scattered small pulmonary nodules are noted, grossly stable compared to the prior study, largest of which is in the right middle lobe (axial image 98 of series 4) measuring up to 5 mm. No confluent consolidative airspace disease. No pleural effusions. High-resolution images demonstrate scattered areas of cylindrical and varicose bronchiectasis, most severe in the left lower lobe where there is associated thickening of the peribronchovascular interstitium along with some regional areas of architectural distortion and chronic volume loss, very similar to the prior study. These findings appear minimally progressive compared to the prior examination and are asymmetric involving the left lung to a greater extent than the right. Inspiratory and expiratory imaging demonstrates widespread air trapping  indicative of small airways disease. No acute consolidative airspace disease. No pleural effusions.   Upper Abdomen: Aortic atherosclerosis.   Musculoskeletal: There are no aggressive appearing lytic or blastic lesions noted in the visualized portions of the skeleton.   IMPRESSION: 1. Fibrotic changes in the lungs,  predominantly manifest as scattered areas of asymmetrically distributed bronchiectasis, grossly similar to prior study from 2019, favored to reflect areas of chronic post infectious or inflammatory scarring. Technically, findings could be categorized as probable usual interstitial pneumonia (UIP) per current ATS guidelines, however, given the stability compared to the prior study, UIP is not favored. 2. Multiple small pulmonary nodules grossly stable compared to prior study from 2019, considered benign. No imaging follow-up is recommended. 3. Aortic atherosclerosis, in addition to left main and 2 vessel coronary artery disease. Assessment for potential risk factor modification, dietary therapy or pharmacologic therapy may be warranted, if clinically indicated.      Latest Ref Rng & Units 11/26/2023    7:56 AM 10/01/2023    9:46 AM 07/30/2023   10:44 AM  CBC  WBC 3.8 - 10.8 Thousand/uL 7.8  7.6  7.4   Hemoglobin 11.7 - 15.5 g/dL 60.4  54.0  98.1   Hematocrit 35.0 - 45.0 % 43.9  40.7  42.9   Platelets 140 - 400 Thousand/uL 216  232  222        Latest Ref Rng & Units 11/26/2023    7:56 AM 10/01/2023    9:46 AM 07/30/2023   10:44 AM  BMP  Glucose 65 - 99 mg/dL 69  91  76   BUN 7 - 25 mg/dL 18  19  18    Creatinine 0.60 - 1.00 mg/dL 1.91  4.78  2.95   BUN/Creat Ratio 6 - 22 (calc) SEE NOTE:  SEE NOTE:  SEE NOTE:   Sodium 135 - 146 mmol/L 140  138  139   Potassium 3.5 - 5.3 mmol/L 4.4  4.3  4.2   Chloride 98 - 110 mmol/L 104  105  103   CO2 20 - 32 mmol/L 23  24  25    Calcium 8.6 - 10.4 mg/dL 9.7  9.9  62.1     BNP No results found for: "BNP"  ProBNP No results found for: "PROBNP"  PFT No results found for: "FEV1PRE", "FEV1POST", "FVCPRE", "FVCPOST", "TLC", "DLCOUNC", "PREFEV1FVCRT", "PSTFEV1FVCRT"  CT CHEST HIGH RESOLUTION Result Date: 12/20/2023 CLINICAL DATA:  73 year old female with history of multiple pulmonary nodules. Follow-up study. EXAM: CT CHEST WITHOUT  CONTRAST TECHNIQUE: Multidetector CT imaging of the chest was performed following the standard protocol without intravenous contrast. High resolution imaging of the lungs, as well as inspiratory and expiratory imaging, was performed. RADIATION DOSE REDUCTION: This exam was performed according to the departmental dose-optimization program which includes automated exposure control, adjustment of the mA and/or kV according to patient size and/or use of iterative reconstruction technique. COMPARISON:  Low-dose lung cancer screening chest CT 02/22/2018. FINDINGS: Cardiovascular: Heart size is normal. There is no significant pericardial fluid, thickening or pericardial calcification. There is aortic atherosclerosis, as well as atherosclerosis of the great vessels of the mediastinum and the coronary arteries, including calcified atherosclerotic plaque in the left main, left anterior descending and right coronary arteries. Mediastinum/Nodes: No pathologically enlarged mediastinal or hilar lymph nodes. Small hiatal hernia. No axillary lymphadenopathy. Lungs/Pleura: Bilateral apical nodular pleuroparenchymal thickening and architectural distortion is again noted, mild and similar to the prior study, most compatible with areas of post infectious or inflammatory scarring. A  few scattered small pulmonary nodules are noted, grossly stable compared to the prior study, largest of which is in the right middle lobe (axial image 98 of series 4) measuring up to 5 mm. No confluent consolidative airspace disease. No pleural effusions. High-resolution images demonstrate scattered areas of cylindrical and varicose bronchiectasis, most severe in the left lower lobe where there is associated thickening of the peribronchovascular interstitium along with some regional areas of architectural distortion and chronic volume loss, very similar to the prior study. These findings appear minimally progressive compared to the prior examination and are  asymmetric involving the left lung to a greater extent than the right. Inspiratory and expiratory imaging demonstrates widespread air trapping indicative of small airways disease. No acute consolidative airspace disease. No pleural effusions. Upper Abdomen: Aortic atherosclerosis. Musculoskeletal: There are no aggressive appearing lytic or blastic lesions noted in the visualized portions of the skeleton. IMPRESSION: 1. Fibrotic changes in the lungs, predominantly manifest as scattered areas of asymmetrically distributed bronchiectasis, grossly similar to prior study from 2019, favored to reflect areas of chronic post infectious or inflammatory scarring. Technically, findings could be categorized as probable usual interstitial pneumonia (UIP) per current ATS guidelines, however, given the stability compared to the prior study, UIP is not favored. 2. Multiple small pulmonary nodules grossly stable compared to prior study from 2019, considered benign. No imaging follow-up is recommended. 3. Aortic atherosclerosis, in addition to left main and 2 vessel coronary artery disease. Assessment for potential risk factor modification, dietary therapy or pharmacologic therapy may be warranted, if clinically indicated. Aortic Atherosclerosis (ICD10-I70.0). Electronically Signed   By: Trudie Reed M.D.   On: 12/20/2023 06:12     Past medical hx Past Medical History:  Diagnosis Date   GERD (gastroesophageal reflux disease)    High blood pressure    Rheumatoid arthritis(714.0)      Social History   Tobacco Use   Smoking status: Former    Current packs/day: 0.00    Average packs/day: 1.3 packs/day for 30.0 years (37.5 ttl pk-yrs)    Types: Cigarettes    Start date: 01/04/1974    Quit date: 01/05/2004    Years since quitting: 19.9    Passive exposure: Never   Smokeless tobacco: Never  Vaping Use   Vaping status: Never Used  Substance Use Topics   Alcohol use: No   Drug use: No    Ms.Rozier reports that  she quit smoking about 19 years ago. Her smoking use included cigarettes. She started smoking about 50 years ago. She has a 37.5 pack-year smoking history. She has never been exposed to tobacco smoke. She has never used smokeless tobacco. She reports that she does not drink alcohol and does not use drugs.  Tobacco Cessation: Former smoker, 30 pack year smoking history , quit 2005   Past surgical hx, Family hx, Social hx all reviewed.  Current Outpatient Medications on File Prior to Visit  Medication Sig   Abatacept (ORENCIA IV) Inject 750 mg into the vein every 28 (twenty-eight) days.   acetaminophen (TYLENOL) 325 MG tablet Take 650 mg by mouth as needed.   Ascorbic Acid (VITAMIN C) 1000 MG tablet Take 1,000 mg by mouth daily.   aspirin 81 MG EC tablet Take 81 mg by mouth daily. Swallow whole.   azelastine (ASTELIN) 0.1 % nasal spray    BREZTRI AEROSPHERE 160-9-4.8 MCG/ACT AERO Inhale into the lungs.   Calcium Carb-Cholecalciferol (CALCIUM 1000 + D PO)    diazepam (VALIUM) 5 MG tablet Take  5 mg by mouth 3 (three) times daily as needed.   diphenhydrAMINE (BENADRYL) 25 mg capsule Take 25 mg by mouth as needed. With Orencia infusions   famotidine (PEPCID) 20 MG tablet daily.   fish oil-omega-3 fatty acids 1000 MG capsule Take 2 g by mouth daily.   levocetirizine (XYZAL) 2.5 MG/5ML solution Take 2.5 mg by mouth daily.   lisinopril (PRINIVIL,ZESTRIL) 20 MG tablet Take 20 mg by mouth daily.   Multiple Vitamins-Minerals (MULTIVITAMIN PO) Take by mouth.   rosuvastatin (CRESTOR) 5 MG tablet Take 5 mg by mouth once a week.   VENTOLIN HFA 108 (90 Base) MCG/ACT inhaler USE 2 PUFFS FOUR TIMES DAILY AS NEEDED   VITAMIN E PO Take 1 tablet by mouth daily.   No current facility-administered medications on file prior to visit.     Allergies  Allergen Reactions   Tetanus Toxoid Swelling    Reaction at age 47.     Review Of Systems:  Constitutional:   No  weight loss, night sweats,  Fevers,  chills, fatigue, or  lassitude.  HEENT:   No headaches,  Difficulty swallowing,  Tooth/dental problems, or  Sore throat,                No sneezing, itching, ear ache, nasal congestion, post nasal drip,   CV:  No chest pain,  Orthopnea, PND, swelling in lower extremities, anasarca, dizziness, palpitations, syncope.   GI  No heartburn, indigestion, abdominal pain, nausea, vomiting, diarrhea, change in bowel habits, loss of appetite, bloody stools.   Resp: Occasional shortness of breath with exertion not  at rest.  No excess mucus, no productive cough,  + non-productive cough,  No coughing up of blood.  No change in color of mucus.  rare wheezing.  No chest wall deformity  Skin: no rash or lesions.  GU: no dysuria, change in color of urine, no urgency or frequency.  No flank pain, no hematuria   MS:  + joint pain or swelling.  + decreased range of motion.  + back pain.  Psych:  No change in mood or affect. Improved  depression and anxiety.  No memory loss.   Vital Signs BP (!) 148/74   Pulse 88   Ht 5\' 5"  (1.651 m)   Wt 172 lb 12.8 oz (78.4 kg)   SpO2 98%   BMI 28.76 kg/m    Physical Exam:  General- No distress,  A&Ox3, pleasant ENT: No sinus tenderness, TM clear, pale nasal mucosa, no oral exudate,no post nasal drip, no LAN Cardiac: S1, S2, regular rate and rhythm, no murmur Chest: No wheeze/ rales/ dullness; no accessory muscle use, no nasal flaring, no sternal retractions, few crackles per bases Abd.: Soft Non-tender, ND, BS +, Body mass index is 28.76 kg/m.  Ext: No clubbing cyanosis, edema Neuro:  normal strength, MAE x 4, A&O x 3, appropriate Skin: No rashes, warm and dry, no obvious lesions  Psych: normal mood and behavior   Assessment/Plan RA on Orencia, immunocompromised Bronchiectasis on Breztri Pulmonary nodules Plan Your CT chest shows stable nodules, but with your RA and immunosuppressive medications in addition to  your smoking history, we will continue  annual scans.  Next scan will be due January 2026.  You will get a call closer to the time to get this scheduled.  We will need to assign you another primary MD in the practice.  We will try to get you scheduled with Dr. Marchelle Gearing to follow up after your next CT  in 12/2024. Continue her triple therapy inhaler, with spacer. Continue flutter valve and aggressive pulmonary toilet Consider plain Mucinex as a mucolytic. Discussed trying to  get her back to a LAMA/LABA in the future to decrease exposure to ICS with bronchiectasis.Molli Knock to continue Orencia at this time for her RA management, but will need to reconsider if more frequent exacerbations or pneumonias.. If she has recurrent problems with infections we may need to consider new treatment options , we can partner with her rheumatologist to determine best options if necessary. Call for any unexplained weight loss, blood in sputum, or cough that does resolve. Please contact office for sooner follow up if symptoms do not improve or worsen or seek emergency care     I spent 25 minutes dedicated to the care of this patient on the date of this encounter to include pre-visit review of records, face-to-face time with the patient discussing conditions above, post visit ordering of testing, clinical documentation with the electronic health record, making appropriate referrals as documented, and communicating necessary information to the patient's healthcare team.    Bevelyn Ngo, NP 12/28/2023  10:17 AM

## 2023-12-30 ENCOUNTER — Ambulatory Visit: Payer: Medicare Other | Admitting: Pulmonary Disease

## 2024-01-03 DIAGNOSIS — B079 Viral wart, unspecified: Secondary | ICD-10-CM | POA: Diagnosis not present

## 2024-01-12 NOTE — Progress Notes (Deleted)
 Office Visit Note  Patient: Tiffany Pollard             Date of Birth: 11/15/1951           MRN: [id number]             PCP: Kirstie Peri, MD Referring: Kirstie Peri, MD Visit Date: 01/26/2024 Occupation: @GUAROCC @  Subjective:  No chief complaint on file.   History of Present Illness: Tiffany Pollard is a 73 y.o. female ***     Activities of Daily Living:  Patient reports morning stiffness for *** {minute/hour:19697}.   Patient {ACTIONS;DENIES/REPORTS:21021675::"Denies"} nocturnal pain.  Difficulty dressing/grooming: {ACTIONS;DENIES/REPORTS:21021675::"Denies"} Difficulty climbing stairs: {ACTIONS;DENIES/REPORTS:21021675::"Denies"} Difficulty getting out of chair: {ACTIONS;DENIES/REPORTS:21021675::"Denies"} Difficulty using hands for taps, buttons, cutlery, and/or writing: {ACTIONS;DENIES/REPORTS:21021675::"Denies"}  No Rheumatology ROS completed.   PMFS History:  Patient Active Problem List   Diagnosis Date Noted   Seropositive rheumatoid arthritis of multiple sites (HCC) 10/12/2022   Primary osteoarthritis of both knees 04/30/2017   History of anemia 04/30/2017   History of migraine 04/30/2017   History of vertigo 04/30/2017   History of gastroesophageal reflux (GERD) 04/30/2017   History of kidney stones 04/30/2017   History of IBS 04/30/2017   History of hyperlipidemia 04/30/2017   Hypercalcemia 04/30/2017   Rheumatoid arthritis involving multiple sites with positive rheumatoid factor (HCC) 12/08/2016   High risk medication use 12/08/2016   Primary osteoarthritis of both hands 12/08/2016   Primary osteoarthritis of both feet 12/08/2016   Osteoarthritis of lumbar spine 12/08/2016   Former smoker 12/08/2016   Abdominal pain, chronic, right lower quadrant 12/11/2013    Past Medical History:  Diagnosis Date   GERD (gastroesophageal reflux disease)    High blood pressure    Rheumatoid arthritis(714.0)     No family history on file. Past Surgical History:   Procedure Laterality Date   BACK SURGERY     CERVICAL CONIZATION W/BX     COLONOSCOPY N/A 12/27/2013   Procedure: COLONOSCOPY;  Surgeon: Malissa Hippo, MD;  Location: [facility];  Service: Endoscopy;  Laterality: N/A;  320-moved to 125 Ann to notify pt   FINGER FRACTURE SURGERY     foot surgery for a bunion     NASAL SINUS SURGERY     TONSILLECTOMY     Social History   Social History Narrative   Not on file   Immunization History  Administered Date(s) Administered   Influenza, High Dose Seasonal PF 08/07/2019, 08/14/2020   Influenza,inj,Quad PF,6+ Mos 08/18/2017   PFIZER(Purple Top)SARS-COV-2 Vaccination 01/12/2020, 02/02/2020, 08/22/2020   Pneumococcal Conjugate-13 07/13/2018     Objective: Vital Signs: There were no vitals taken for this visit.   Physical Exam   Musculoskeletal Exam: ***  CDAI Exam: CDAI Score: -- Patient Global: --; Provider Global: -- Swollen: --; Tender: -- Joint Exam 01/26/2024   No joint exam has been documented for this visit   There is currently no information documented on the homunculus. Go to the Rheumatology activity and complete the homunculus joint exam.  Investigation: No additional findings.  Imaging: No results found.  Recent Labs: Lab Results  Component Value Date   WBC 7.8 11/26/2023   HGB 14.3 11/26/2023   PLT 216 11/26/2023   NA 140 11/26/2023   K 4.4 11/26/2023   CL 104 11/26/2023   CO2 23 11/26/2023   GLUCOSE 69 11/26/2023   BUN 18 11/26/2023   CREATININE 0.93 11/26/2023   BILITOT 0.4 11/26/2023   ALKPHOS 80 06/04/2023   AST  23 11/26/2023   ALT 24 11/26/2023   PROT 7.0 11/26/2023   ALBUMIN 4.1 06/04/2023   CALCIUM 9.7 11/26/2023   GFRAA >60 08/29/2020   QFTBGOLD Negative 03/11/2017   QFTBGOLDPLUS Negative 04/08/2023    Speciality Comments: Simply Aria IV-recurrent infections, Enbrel-insurance issues  Procedures:  No procedures performed Allergies: Tetanus toxoid   Assessment / Plan:     Visit  Diagnoses: No diagnosis found.  Orders: No orders of the defined types were placed in this encounter.  No orders of the defined types were placed in this encounter.   Face-to-face time spent with patient was *** minutes. Greater than 50% of time was spent in counseling and coordination of care.  Follow-Up Instructions: No follow-ups on file.   Ellen Henri, CMA  Note - This record has been created using Animal nutritionist.  Chart creation errors have been sought, but may not always  have been located. Such creation errors do not reflect on  the standard of medical care.

## 2024-01-14 DIAGNOSIS — J208 Acute bronchitis due to other specified organisms: Secondary | ICD-10-CM | POA: Diagnosis not present

## 2024-01-14 DIAGNOSIS — R509 Fever, unspecified: Secondary | ICD-10-CM | POA: Diagnosis not present

## 2024-01-21 ENCOUNTER — Other Ambulatory Visit: Payer: Self-pay | Admitting: Emergency Medicine

## 2024-01-21 ENCOUNTER — Encounter: Payer: Medicare Other | Attending: Rheumatology | Admitting: Emergency Medicine

## 2024-01-21 VITALS — BP 124/62 | HR 76 | Temp 98.1°F | Resp 16

## 2024-01-21 DIAGNOSIS — Z862 Personal history of diseases of the blood and blood-forming organs and certain disorders involving the immune mechanism: Secondary | ICD-10-CM | POA: Insufficient documentation

## 2024-01-21 DIAGNOSIS — Z8669 Personal history of other diseases of the nervous system and sense organs: Secondary | ICD-10-CM | POA: Insufficient documentation

## 2024-01-21 DIAGNOSIS — M19071 Primary osteoarthritis, right ankle and foot: Secondary | ICD-10-CM | POA: Insufficient documentation

## 2024-01-21 DIAGNOSIS — M0579 Rheumatoid arthritis with rheumatoid factor of multiple sites without organ or systems involvement: Secondary | ICD-10-CM | POA: Diagnosis not present

## 2024-01-21 DIAGNOSIS — M19072 Primary osteoarthritis, left ankle and foot: Secondary | ICD-10-CM | POA: Insufficient documentation

## 2024-01-21 DIAGNOSIS — M19042 Primary osteoarthritis, left hand: Secondary | ICD-10-CM | POA: Diagnosis not present

## 2024-01-21 DIAGNOSIS — Z87898 Personal history of other specified conditions: Secondary | ICD-10-CM | POA: Diagnosis not present

## 2024-01-21 DIAGNOSIS — Z87891 Personal history of nicotine dependence: Secondary | ICD-10-CM | POA: Insufficient documentation

## 2024-01-21 DIAGNOSIS — Z8639 Personal history of other endocrine, nutritional and metabolic disease: Secondary | ICD-10-CM | POA: Insufficient documentation

## 2024-01-21 DIAGNOSIS — M19041 Primary osteoarthritis, right hand: Secondary | ICD-10-CM | POA: Insufficient documentation

## 2024-01-21 DIAGNOSIS — M4004 Postural kyphosis, thoracic region: Secondary | ICD-10-CM | POA: Diagnosis not present

## 2024-01-21 DIAGNOSIS — Z87442 Personal history of urinary calculi: Secondary | ICD-10-CM | POA: Insufficient documentation

## 2024-01-21 DIAGNOSIS — R918 Other nonspecific abnormal finding of lung field: Secondary | ICD-10-CM | POA: Diagnosis not present

## 2024-01-21 DIAGNOSIS — F419 Anxiety disorder, unspecified: Secondary | ICD-10-CM | POA: Insufficient documentation

## 2024-01-21 DIAGNOSIS — M810 Age-related osteoporosis without current pathological fracture: Secondary | ICD-10-CM | POA: Diagnosis not present

## 2024-01-21 DIAGNOSIS — Z79899 Other long term (current) drug therapy: Secondary | ICD-10-CM | POA: Insufficient documentation

## 2024-01-21 DIAGNOSIS — J479 Bronchiectasis, uncomplicated: Secondary | ICD-10-CM | POA: Diagnosis not present

## 2024-01-21 DIAGNOSIS — M170 Bilateral primary osteoarthritis of knee: Secondary | ICD-10-CM | POA: Diagnosis not present

## 2024-01-21 DIAGNOSIS — Z8719 Personal history of other diseases of the digestive system: Secondary | ICD-10-CM | POA: Insufficient documentation

## 2024-01-21 DIAGNOSIS — M47816 Spondylosis without myelopathy or radiculopathy, lumbar region: Secondary | ICD-10-CM | POA: Diagnosis not present

## 2024-01-21 MED ORDER — ABATACEPT 250 MG IV SOLR
750.0000 mg | INTRAVENOUS | Status: DC
  Administered 2024-01-21: 750 mg via INTRAVENOUS
  Filled 2024-01-21: qty 30

## 2024-01-21 NOTE — Progress Notes (Signed)
Diagnosis: Rheumatoid Arthritis  Provider:  Pollyann Savoy MD  Procedure: IV Infusion  IV Type: Peripheral, IV Location: R Antecubital  Orencia (Abatacept), Dose: 750 mg  Infusion Start Time: 0918  Infusion Stop Time: 0948  Post Infusion IV Care: Peripheral IV Discontinued  Discharge: Condition: Good, Destination: Home . AVS Provided  Performed by:  Arrie Senate, RN

## 2024-01-22 LAB — COMPREHENSIVE METABOLIC PANEL
AG Ratio: 1.9 (calc) (ref 1.0–2.5)
ALT: 20 U/L (ref 6–29)
AST: 18 U/L (ref 10–35)
Albumin: 4.1 g/dL (ref 3.6–5.1)
Alkaline phosphatase (APISO): 73 U/L (ref 37–153)
BUN: 24 mg/dL (ref 7–25)
CO2: 25 mmol/L (ref 20–32)
Calcium: 9.5 mg/dL (ref 8.6–10.4)
Chloride: 104 mmol/L (ref 98–110)
Creat: 0.9 mg/dL (ref 0.60–1.00)
Globulin: 2.2 g/dL (ref 1.9–3.7)
Glucose, Bld: 76 mg/dL (ref 65–99)
Potassium: 4.6 mmol/L (ref 3.5–5.3)
Sodium: 138 mmol/L (ref 135–146)
Total Bilirubin: 0.4 mg/dL (ref 0.2–1.2)
Total Protein: 6.3 g/dL (ref 6.1–8.1)

## 2024-01-22 LAB — CBC WITH DIFFERENTIAL/PLATELET
Absolute Lymphocytes: 2383 {cells}/uL (ref 850–3900)
Absolute Monocytes: 1021 {cells}/uL — ABNORMAL HIGH (ref 200–950)
Basophils Absolute: 74 {cells}/uL (ref 0–200)
Basophils Relative: 0.8 %
Eosinophils Absolute: 699 {cells}/uL — ABNORMAL HIGH (ref 15–500)
Eosinophils Relative: 7.6 %
HCT: 42 % (ref 35.0–45.0)
Hemoglobin: 14.1 g/dL (ref 11.7–15.5)
MCH: 28.7 pg (ref 27.0–33.0)
MCHC: 33.6 g/dL (ref 32.0–36.0)
MCV: 85.4 fL (ref 80.0–100.0)
MPV: 9.9 fL (ref 7.5–12.5)
Monocytes Relative: 11.1 %
Neutro Abs: 5023 {cells}/uL (ref 1500–7800)
Neutrophils Relative %: 54.6 %
Platelets: 215 10*3/uL (ref 140–400)
RBC: 4.92 10*6/uL (ref 3.80–5.10)
RDW: 12.8 % (ref 11.0–15.0)
Total Lymphocyte: 25.9 %
WBC: 9.2 10*3/uL (ref 3.8–10.8)

## 2024-01-23 NOTE — Progress Notes (Signed)
CBC and CMP are normal except absolute monocytes and eosinophils are mildly elevated.  We will continue to monitor.  Please forward results to her PCP.

## 2024-01-25 DIAGNOSIS — L570 Actinic keratosis: Secondary | ICD-10-CM | POA: Diagnosis not present

## 2024-01-25 DIAGNOSIS — B079 Viral wart, unspecified: Secondary | ICD-10-CM | POA: Diagnosis not present

## 2024-01-25 NOTE — Progress Notes (Unsigned)
Office Visit Note  Patient: Tiffany Pollard             Date of Birth: Mar 29, 1951           MRN: [id number]             PCP: Kirstie Peri, MD Referring: Kirstie Peri, MD Visit Date: 01/28/2024 Occupation: @GUAROCC @  Subjective:  No chief complaint on file.   History of Present Illness: Tiffany Pollard is a 73 y.o. female ***     Activities of Daily Living:  Patient reports morning stiffness for *** {minute/hour:19697}.   Patient {ACTIONS;DENIES/REPORTS:21021675::"Denies"} nocturnal pain.  Difficulty dressing/grooming: {ACTIONS;DENIES/REPORTS:21021675::"Denies"} Difficulty climbing stairs: {ACTIONS;DENIES/REPORTS:21021675::"Denies"} Difficulty getting out of chair: {ACTIONS;DENIES/REPORTS:21021675::"Denies"} Difficulty using hands for taps, buttons, cutlery, and/or writing: {ACTIONS;DENIES/REPORTS:21021675::"Denies"}  No Rheumatology ROS completed.   PMFS History:  Patient Active Problem List   Diagnosis Date Noted   Seropositive rheumatoid arthritis of multiple sites (HCC) 10/12/2022   Primary osteoarthritis of both knees 04/30/2017   History of anemia 04/30/2017   History of migraine 04/30/2017   History of vertigo 04/30/2017   History of gastroesophageal reflux (GERD) 04/30/2017   History of kidney stones 04/30/2017   History of IBS 04/30/2017   History of hyperlipidemia 04/30/2017   Hypercalcemia 04/30/2017   Rheumatoid arthritis involving multiple sites with positive rheumatoid factor (HCC) 12/08/2016   High risk medication use 12/08/2016   Primary osteoarthritis of both hands 12/08/2016   Primary osteoarthritis of both feet 12/08/2016   Osteoarthritis of lumbar spine 12/08/2016   Former smoker 12/08/2016   Abdominal pain, chronic, right lower quadrant 12/11/2013    Past Medical History:  Diagnosis Date   GERD (gastroesophageal reflux disease)    High blood pressure    Rheumatoid arthritis(714.0)     No family history on file. Past Surgical History:   Procedure Laterality Date   BACK SURGERY     CERVICAL CONIZATION W/BX     COLONOSCOPY N/A 12/27/2013   Procedure: COLONOSCOPY;  Surgeon: Malissa Hippo, MD;  Location: [facility];  Service: Endoscopy;  Laterality: N/A;  320-moved to 125 Ann to notify pt   FINGER FRACTURE SURGERY     foot surgery for a bunion     NASAL SINUS SURGERY     TONSILLECTOMY     Social History   Social History Narrative   Not on file   Immunization History  Administered Date(s) Administered   Influenza, High Dose Seasonal PF 08/07/2019, 08/14/2020   Influenza,inj,Quad PF,6+ Mos 08/18/2017   PFIZER(Purple Top)SARS-COV-2 Vaccination 01/12/2020, 02/02/2020, 08/22/2020   Pneumococcal Conjugate-13 07/13/2018     Objective: Vital Signs: There were no vitals taken for this visit.   Physical Exam   Musculoskeletal Exam: ***  CDAI Exam: CDAI Score: -- Patient Global: --; Provider Global: -- Swollen: --; Tender: -- Joint Exam 01/28/2024   No joint exam has been documented for this visit   There is currently no information documented on the homunculus. Go to the Rheumatology activity and complete the homunculus joint exam.  Investigation: No additional findings.  Imaging: No results found.  Recent Labs: Lab Results  Component Value Date   WBC 9.2 01/21/2024   HGB 14.1 01/21/2024   PLT 215 01/21/2024   NA 138 01/21/2024   K 4.6 01/21/2024   CL 104 01/21/2024   CO2 25 01/21/2024   GLUCOSE 76 01/21/2024   BUN 24 01/21/2024   CREATININE 0.90 01/21/2024   BILITOT 0.4 01/21/2024   ALKPHOS 80 06/04/2023   AST  18 01/21/2024   ALT 20 01/21/2024   PROT 6.3 01/21/2024   ALBUMIN 4.1 06/04/2023   CALCIUM 9.5 01/21/2024   GFRAA >60 08/29/2020   QFTBGOLD Negative 03/11/2017   QFTBGOLDPLUS Negative 04/08/2023    Speciality Comments: Simply Aria IV-recurrent infections, Enbrel-insurance issues  Procedures:  No procedures performed Allergies: Tetanus toxoid   Assessment / Plan:     Visit  Diagnoses: No diagnosis found.  Orders: No orders of the defined types were placed in this encounter.  No orders of the defined types were placed in this encounter.   Face-to-face time spent with patient was *** minutes. Greater than 50% of time was spent in counseling and coordination of care.  Follow-Up Instructions: No follow-ups on file.   Ellen Henri, CMA  Note - This record has been created using [employer].  Chart creation errors have been sought, but may not always  have been located. Such creation errors do not reflect on  the standard of medical care.

## 2024-01-26 ENCOUNTER — Ambulatory Visit: Payer: Medicare Other | Admitting: Rheumatology

## 2024-01-26 DIAGNOSIS — Z862 Personal history of diseases of the blood and blood-forming organs and certain disorders involving the immune mechanism: Secondary | ICD-10-CM

## 2024-01-26 DIAGNOSIS — Z8669 Personal history of other diseases of the nervous system and sense organs: Secondary | ICD-10-CM

## 2024-01-26 DIAGNOSIS — M4004 Postural kyphosis, thoracic region: Secondary | ICD-10-CM

## 2024-01-26 DIAGNOSIS — M47816 Spondylosis without myelopathy or radiculopathy, lumbar region: Secondary | ICD-10-CM

## 2024-01-26 DIAGNOSIS — Z8719 Personal history of other diseases of the digestive system: Secondary | ICD-10-CM

## 2024-01-26 DIAGNOSIS — R918 Other nonspecific abnormal finding of lung field: Secondary | ICD-10-CM

## 2024-01-26 DIAGNOSIS — F419 Anxiety disorder, unspecified: Secondary | ICD-10-CM

## 2024-01-26 DIAGNOSIS — M19041 Primary osteoarthritis, right hand: Secondary | ICD-10-CM

## 2024-01-26 DIAGNOSIS — M810 Age-related osteoporosis without current pathological fracture: Secondary | ICD-10-CM

## 2024-01-26 DIAGNOSIS — M170 Bilateral primary osteoarthritis of knee: Secondary | ICD-10-CM

## 2024-01-26 DIAGNOSIS — M19071 Primary osteoarthritis, right ankle and foot: Secondary | ICD-10-CM

## 2024-01-26 DIAGNOSIS — Z79899 Other long term (current) drug therapy: Secondary | ICD-10-CM

## 2024-01-26 DIAGNOSIS — Z87898 Personal history of other specified conditions: Secondary | ICD-10-CM

## 2024-01-26 DIAGNOSIS — Z8639 Personal history of other endocrine, nutritional and metabolic disease: Secondary | ICD-10-CM

## 2024-01-26 DIAGNOSIS — Z87891 Personal history of nicotine dependence: Secondary | ICD-10-CM

## 2024-01-26 DIAGNOSIS — Z87442 Personal history of urinary calculi: Secondary | ICD-10-CM

## 2024-01-26 DIAGNOSIS — M0579 Rheumatoid arthritis with rheumatoid factor of multiple sites without organ or systems involvement: Secondary | ICD-10-CM

## 2024-01-26 DIAGNOSIS — J479 Bronchiectasis, uncomplicated: Secondary | ICD-10-CM

## 2024-01-28 ENCOUNTER — Encounter: Payer: Self-pay | Admitting: Rheumatology

## 2024-01-28 ENCOUNTER — Other Ambulatory Visit: Payer: Self-pay | Admitting: Pharmacist

## 2024-01-28 ENCOUNTER — Encounter (INDEPENDENT_AMBULATORY_CARE_PROVIDER_SITE_OTHER): Payer: Medicare Other | Admitting: Rheumatology

## 2024-01-28 VITALS — BP 124/72 | HR 88 | Resp 15 | Ht 65.0 in | Wt 173.2 lb

## 2024-01-28 DIAGNOSIS — M47816 Spondylosis without myelopathy or radiculopathy, lumbar region: Secondary | ICD-10-CM

## 2024-01-28 DIAGNOSIS — Z87442 Personal history of urinary calculi: Secondary | ICD-10-CM | POA: Diagnosis not present

## 2024-01-28 DIAGNOSIS — M4004 Postural kyphosis, thoracic region: Secondary | ICD-10-CM | POA: Diagnosis not present

## 2024-01-28 DIAGNOSIS — M170 Bilateral primary osteoarthritis of knee: Secondary | ICD-10-CM

## 2024-01-28 DIAGNOSIS — Z8669 Personal history of other diseases of the nervous system and sense organs: Secondary | ICD-10-CM

## 2024-01-28 DIAGNOSIS — Z87898 Personal history of other specified conditions: Secondary | ICD-10-CM

## 2024-01-28 DIAGNOSIS — M810 Age-related osteoporosis without current pathological fracture: Secondary | ICD-10-CM

## 2024-01-28 DIAGNOSIS — R918 Other nonspecific abnormal finding of lung field: Secondary | ICD-10-CM

## 2024-01-28 DIAGNOSIS — Z8719 Personal history of other diseases of the digestive system: Secondary | ICD-10-CM

## 2024-01-28 DIAGNOSIS — M19042 Primary osteoarthritis, left hand: Secondary | ICD-10-CM

## 2024-01-28 DIAGNOSIS — M19071 Primary osteoarthritis, right ankle and foot: Secondary | ICD-10-CM

## 2024-01-28 DIAGNOSIS — M19072 Primary osteoarthritis, left ankle and foot: Secondary | ICD-10-CM

## 2024-01-28 DIAGNOSIS — M0579 Rheumatoid arthritis with rheumatoid factor of multiple sites without organ or systems involvement: Secondary | ICD-10-CM

## 2024-01-28 DIAGNOSIS — Z862 Personal history of diseases of the blood and blood-forming organs and certain disorders involving the immune mechanism: Secondary | ICD-10-CM

## 2024-01-28 DIAGNOSIS — Z79899 Other long term (current) drug therapy: Secondary | ICD-10-CM | POA: Diagnosis not present

## 2024-01-28 DIAGNOSIS — F419 Anxiety disorder, unspecified: Secondary | ICD-10-CM

## 2024-01-28 DIAGNOSIS — M19041 Primary osteoarthritis, right hand: Secondary | ICD-10-CM

## 2024-01-28 DIAGNOSIS — Z87891 Personal history of nicotine dependence: Secondary | ICD-10-CM

## 2024-01-28 DIAGNOSIS — J479 Bronchiectasis, uncomplicated: Secondary | ICD-10-CM

## 2024-01-28 DIAGNOSIS — Z8639 Personal history of other endocrine, nutritional and metabolic disease: Secondary | ICD-10-CM

## 2024-01-28 NOTE — Patient Instructions (Signed)
 Vaccines You are taking a medication(s) that can suppress your immune system.  The following immunizations are recommended: Flu annually Covid-19  RSV Td/Tdap (tetanus, diphtheria, pertussis) every 10 years Pneumonia (Prevnar 15 then Pneumovax 23 at least 1 year apart.  Alternatively, can take Prevnar 20 without needing additional dose) Shingrix: 2 doses from 4 weeks to 6 months apart  Please check with your PCP to make sure you are up to date.   If you have signs or symptoms of an infection or start antibiotics: First, call your PCP for workup of your infection. Hold your medication through the infection, until you complete your antibiotics, and until symptoms resolve if you take the following: Injectable medication (Actemra, Benlysta, Cimzia, Cosentyx, Enbrel, Humira, Kevzara, Orencia, Remicade, Simponi, Stelara, Taltz, Tremfya) Methotrexate Leflunomide (Arava) Mycophenolate (Cellcept) Harriette Ohara, Olumiant, or Rinvoq

## 2024-01-28 NOTE — Progress Notes (Signed)
TB gold ordered already in place to be drawn in May 2025

## 2024-02-14 DIAGNOSIS — E7800 Pure hypercholesterolemia, unspecified: Secondary | ICD-10-CM | POA: Diagnosis not present

## 2024-02-14 DIAGNOSIS — Z299 Encounter for prophylactic measures, unspecified: Secondary | ICD-10-CM | POA: Diagnosis not present

## 2024-02-14 DIAGNOSIS — H524 Presbyopia: Secondary | ICD-10-CM | POA: Diagnosis not present

## 2024-02-14 DIAGNOSIS — H40013 Open angle with borderline findings, low risk, bilateral: Secondary | ICD-10-CM | POA: Diagnosis not present

## 2024-02-14 DIAGNOSIS — I10 Essential (primary) hypertension: Secondary | ICD-10-CM | POA: Diagnosis not present

## 2024-02-14 DIAGNOSIS — J449 Chronic obstructive pulmonary disease, unspecified: Secondary | ICD-10-CM | POA: Diagnosis not present

## 2024-02-14 DIAGNOSIS — R52 Pain, unspecified: Secondary | ICD-10-CM | POA: Diagnosis not present

## 2024-02-15 DIAGNOSIS — E7800 Pure hypercholesterolemia, unspecified: Secondary | ICD-10-CM | POA: Diagnosis not present

## 2024-02-18 ENCOUNTER — Encounter: Payer: Medicare Other | Attending: Rheumatology | Admitting: Internal Medicine

## 2024-02-18 VITALS — BP 112/57 | HR 70 | Temp 98.2°F | Resp 16

## 2024-02-18 DIAGNOSIS — M0579 Rheumatoid arthritis with rheumatoid factor of multiple sites without organ or systems involvement: Secondary | ICD-10-CM | POA: Diagnosis not present

## 2024-02-18 MED ORDER — SODIUM CHLORIDE 0.9 % IV SOLN
750.0000 mg | INTRAVENOUS | Status: DC
  Administered 2024-02-18: 750 mg via INTRAVENOUS
  Filled 2024-02-18: qty 30

## 2024-02-18 NOTE — Progress Notes (Signed)
 Diagnosis: Osteoporosis  Provider:  Pollyann Savoy MD  Procedure: IV Infusion  IV Type: Peripheral, IV Location: L Antecubital  Orencia (Abatacept), Dose: 750 mg  Infusion Start Time: 0905  Infusion Stop Time: 0939  Post Infusion IV Care: Observation period completed  Discharge: Condition: Good, Destination: Home . AVS Provided  Performed by:  Cleotilde Neer, LPN

## 2024-02-20 ENCOUNTER — Encounter: Payer: Self-pay | Admitting: Rheumatology

## 2024-03-17 ENCOUNTER — Other Ambulatory Visit: Payer: Self-pay | Admitting: Emergency Medicine

## 2024-03-17 ENCOUNTER — Encounter: Attending: Rheumatology | Admitting: Emergency Medicine

## 2024-03-17 VITALS — BP 156/72 | HR 64 | Temp 97.7°F | Resp 18

## 2024-03-17 DIAGNOSIS — M0579 Rheumatoid arthritis with rheumatoid factor of multiple sites without organ or systems involvement: Secondary | ICD-10-CM

## 2024-03-17 MED ORDER — SODIUM CHLORIDE 0.9 % IV SOLN
750.0000 mg | INTRAVENOUS | Status: DC
  Administered 2024-03-17: 750 mg via INTRAVENOUS
  Filled 2024-03-17: qty 30

## 2024-03-17 MED ORDER — ACETAMINOPHEN 325 MG PO TABS: 650.0000 mg | ORAL_TABLET | Freq: Once | ORAL | Status: DC

## 2024-03-17 MED ORDER — DIPHENHYDRAMINE HCL 25 MG PO CAPS
25.0000 mg | ORAL_CAPSULE | Freq: Once | ORAL | Status: DC
Start: 2024-03-17 — End: 2024-03-17

## 2024-03-17 NOTE — Progress Notes (Signed)
 Diagnosis: Rheumatoid Arthritis  Provider:  Pollyann Savoy MD  Procedure: IV Infusion  IV Type: Peripheral, IV Location: L Antecubital  Orencia (Abatacept), Dose: 750 mg Pt takes pre-medications at home  Infusion Start Time: 1007  Infusion Stop Time: 1037  Post Infusion IV Care: Peripheral IV Discontinued  Discharge: Condition: Good, Destination: Home . AVS Provided  Performed by:  Arrie Senate, RN

## 2024-03-21 LAB — COMPREHENSIVE METABOLIC PANEL WITH GFR
AG Ratio: 2 (calc) (ref 1.0–2.5)
ALT: 23 U/L (ref 6–29)
AST: 23 U/L (ref 10–35)
Albumin: 4.3 g/dL (ref 3.6–5.1)
Alkaline phosphatase (APISO): 81 U/L (ref 37–153)
BUN: 20 mg/dL (ref 7–25)
CO2: 26 mmol/L (ref 20–32)
Calcium: 9.7 mg/dL (ref 8.6–10.4)
Chloride: 105 mmol/L (ref 98–110)
Creat: 1 mg/dL (ref 0.60–1.00)
Globulin: 2.1 g/dL (ref 1.9–3.7)
Glucose, Bld: 58 mg/dL — ABNORMAL LOW (ref 65–99)
Potassium: 4.2 mmol/L (ref 3.5–5.3)
Sodium: 140 mmol/L (ref 135–146)
Total Bilirubin: 0.3 mg/dL (ref 0.2–1.2)
Total Protein: 6.4 g/dL (ref 6.1–8.1)
eGFR: 60 mL/min/{1.73_m2} (ref >=60)

## 2024-03-21 LAB — CBC WITH DIFFERENTIAL/PLATELET
Absolute Lymphocytes: 1458 {cells}/uL (ref 850–3900)
Absolute Monocytes: 648 {cells}/uL (ref 200–950)
Basophils Absolute: 49 {cells}/uL (ref 0–200)
Basophils Relative: 0.6 %
Eosinophils Absolute: 284 {cells}/uL (ref 15–500)
Eosinophils Relative: 3.5 %
HCT: 41.1 % (ref 35.0–45.0)
Hemoglobin: 13.5 g/dL (ref 11.7–15.5)
MCH: 28.4 pg (ref 27.0–33.0)
MCHC: 32.8 g/dL (ref 32.0–36.0)
MCV: 86.5 fL (ref 80.0–100.0)
MPV: 10 fL (ref 7.5–12.5)
Monocytes Relative: 8 %
Neutro Abs: 5662 {cells}/uL (ref 1500–7800)
Neutrophils Relative %: 69.9 %
Platelets: 201 10*3/uL (ref 140–400)
RBC: 4.75 10*6/uL (ref 3.80–5.10)
RDW: 12.5 % (ref 11.0–15.0)
Total Lymphocyte: 18 %
WBC: 8.1 10*3/uL (ref 3.8–10.8)

## 2024-03-21 LAB — QUANTIFERON-TB GOLD PLUS
Mitogen-NIL: 9.29 [IU]/mL
NIL: 0.01 [IU]/mL
QuantiFERON-TB Gold Plus: NEGATIVE
TB1-NIL: 0.01 [IU]/mL
TB2-NIL: 0.01 [IU]/mL

## 2024-03-21 NOTE — Progress Notes (Signed)
 CBC and CMP are within normal limits.  Glucose was low probably the sample error.  TB Gold negative.

## 2024-04-10 DIAGNOSIS — B079 Viral wart, unspecified: Secondary | ICD-10-CM | POA: Diagnosis not present

## 2024-04-14 ENCOUNTER — Encounter: Attending: Rheumatology | Admitting: *Deleted

## 2024-04-14 VITALS — BP 146/73 | HR 70 | Temp 97.9°F | Resp 16

## 2024-04-14 DIAGNOSIS — M0579 Rheumatoid arthritis with rheumatoid factor of multiple sites without organ or systems involvement: Secondary | ICD-10-CM | POA: Insufficient documentation

## 2024-04-14 MED ORDER — ABATACEPT 250 MG IV SOLR
750.0000 mg | INTRAVENOUS | Status: DC
  Administered 2024-04-14: 750 mg via INTRAVENOUS
  Filled 2024-04-14: qty 30

## 2024-04-14 NOTE — Progress Notes (Signed)
 Diagnosis: Rheumatoid Arthritis  Provider:  Nicholas Bari MD  Procedure: IV Infusion  IV Type: Peripheral, IV Location: L Antecubital  Orencia  (Abatacept ), Dose: 750 mg  Infusion Start Time: 0932  Infusion Stop Time: 1002  Post Infusion IV Care: Peripheral IV Discontinued  Discharge: Condition: Good, Destination: Home . AVS Declined  Performed by:  Devonne Folk, RN

## 2024-05-12 ENCOUNTER — Other Ambulatory Visit: Payer: Self-pay | Admitting: Emergency Medicine

## 2024-05-12 ENCOUNTER — Encounter: Attending: Rheumatology | Admitting: Emergency Medicine

## 2024-05-12 VITALS — BP 124/74 | HR 68 | Temp 97.8°F | Resp 16

## 2024-05-12 DIAGNOSIS — M0579 Rheumatoid arthritis with rheumatoid factor of multiple sites without organ or systems involvement: Secondary | ICD-10-CM | POA: Insufficient documentation

## 2024-05-12 MED ORDER — SODIUM CHLORIDE 0.9 % IV SOLN
750.0000 mg | INTRAVENOUS | Status: DC
  Administered 2024-05-12: 750 mg via INTRAVENOUS
  Filled 2024-05-12: qty 30

## 2024-05-12 NOTE — Progress Notes (Signed)
 Diagnosis: Rheumatoid Arthritis  Provider:  Nicholas Bari MD  Procedure: IV Infusion  IV Type: Peripheral, IV Location: L Forearm  Orencia  (Abatacept ), Dose: 750 mg  Infusion Start Time: 0946  Infusion Stop Time: 1019  Post Infusion IV Care: Observation period completed and Peripheral IV Discontinued  Discharge: Condition: Good, Destination: Home . AVS Provided  Performed by:  Rico Charters, RN

## 2024-05-13 ENCOUNTER — Ambulatory Visit: Payer: Self-pay | Admitting: Rheumatology

## 2024-05-13 LAB — COMPREHENSIVE METABOLIC PANEL WITH GFR
AG Ratio: 2 (calc) (ref 1.0–2.5)
ALT: 28 U/L (ref 6–29)
AST: 28 U/L (ref 10–35)
Albumin: 4.6 g/dL (ref 3.6–5.1)
Alkaline phosphatase (APISO): 77 U/L (ref 37–153)
BUN: 19 mg/dL (ref 7–25)
CO2: 25 mmol/L (ref 20–32)
Calcium: 9.6 mg/dL (ref 8.6–10.4)
Chloride: 104 mmol/L (ref 98–110)
Creat: 0.95 mg/dL (ref 0.60–1.00)
Globulin: 2.3 g/dL (ref 1.9–3.7)
Glucose, Bld: 71 mg/dL (ref 65–99)
Potassium: 4.5 mmol/L (ref 3.5–5.3)
Sodium: 139 mmol/L (ref 135–146)
Total Bilirubin: 0.5 mg/dL (ref 0.2–1.2)
Total Protein: 6.9 g/dL (ref 6.1–8.1)
eGFR: 64 mL/min/{1.73_m2} (ref >=60)

## 2024-05-13 LAB — CBC WITH DIFFERENTIAL/PLATELET
Absolute Lymphocytes: 1871 {cells}/uL (ref 850–3900)
Absolute Monocytes: 878 {cells}/uL (ref 200–950)
Basophils Absolute: 62 {cells}/uL (ref 0–200)
Basophils Relative: 0.8 %
Eosinophils Absolute: 300 {cells}/uL (ref 15–500)
Eosinophils Relative: 3.9 %
HCT: 42.1 % (ref 35.0–45.0)
Hemoglobin: 13.8 g/dL (ref 11.7–15.5)
MCH: 29 pg (ref 27.0–33.0)
MCHC: 32.8 g/dL (ref 32.0–36.0)
MCV: 88.4 fL (ref 80.0–100.0)
MPV: 10.2 fL (ref 7.5–12.5)
Monocytes Relative: 11.4 %
Neutro Abs: 4589 {cells}/uL (ref 1500–7800)
Neutrophils Relative %: 59.6 %
Platelets: 188 10*3/uL (ref 140–400)
RBC: 4.76 10*6/uL (ref 3.80–5.10)
RDW: 12.9 % (ref 11.0–15.0)
Total Lymphocyte: 24.3 %
WBC: 7.7 10*3/uL (ref 3.8–10.8)

## 2024-05-13 NOTE — Progress Notes (Signed)
 CBC and CMP are normal.

## 2024-05-16 ENCOUNTER — Other Ambulatory Visit (HOSPITAL_COMMUNITY): Payer: Self-pay | Admitting: Internal Medicine

## 2024-05-16 DIAGNOSIS — Z1231 Encounter for screening mammogram for malignant neoplasm of breast: Secondary | ICD-10-CM

## 2024-05-23 DIAGNOSIS — B079 Viral wart, unspecified: Secondary | ICD-10-CM | POA: Diagnosis not present

## 2024-06-12 ENCOUNTER — Encounter: Attending: Rheumatology | Admitting: *Deleted

## 2024-06-12 VITALS — BP 130/73 | HR 80 | Temp 97.8°F | Resp 16

## 2024-06-12 DIAGNOSIS — M19041 Primary osteoarthritis, right hand: Secondary | ICD-10-CM | POA: Diagnosis not present

## 2024-06-12 DIAGNOSIS — Z79899 Other long term (current) drug therapy: Secondary | ICD-10-CM | POA: Insufficient documentation

## 2024-06-12 DIAGNOSIS — M810 Age-related osteoporosis without current pathological fracture: Secondary | ICD-10-CM | POA: Diagnosis not present

## 2024-06-12 DIAGNOSIS — R918 Other nonspecific abnormal finding of lung field: Secondary | ICD-10-CM | POA: Diagnosis not present

## 2024-06-12 DIAGNOSIS — M170 Bilateral primary osteoarthritis of knee: Secondary | ICD-10-CM | POA: Insufficient documentation

## 2024-06-12 DIAGNOSIS — Z8669 Personal history of other diseases of the nervous system and sense organs: Secondary | ICD-10-CM | POA: Insufficient documentation

## 2024-06-12 DIAGNOSIS — Z8639 Personal history of other endocrine, nutritional and metabolic disease: Secondary | ICD-10-CM | POA: Insufficient documentation

## 2024-06-12 DIAGNOSIS — M19072 Primary osteoarthritis, left ankle and foot: Secondary | ICD-10-CM | POA: Diagnosis not present

## 2024-06-12 DIAGNOSIS — Z87891 Personal history of nicotine dependence: Secondary | ICD-10-CM | POA: Diagnosis not present

## 2024-06-12 DIAGNOSIS — J479 Bronchiectasis, uncomplicated: Secondary | ICD-10-CM | POA: Insufficient documentation

## 2024-06-12 DIAGNOSIS — Z862 Personal history of diseases of the blood and blood-forming organs and certain disorders involving the immune mechanism: Secondary | ICD-10-CM | POA: Insufficient documentation

## 2024-06-12 DIAGNOSIS — M19042 Primary osteoarthritis, left hand: Secondary | ICD-10-CM | POA: Insufficient documentation

## 2024-06-12 DIAGNOSIS — F419 Anxiety disorder, unspecified: Secondary | ICD-10-CM | POA: Diagnosis not present

## 2024-06-12 DIAGNOSIS — Z87898 Personal history of other specified conditions: Secondary | ICD-10-CM | POA: Insufficient documentation

## 2024-06-12 DIAGNOSIS — M0579 Rheumatoid arthritis with rheumatoid factor of multiple sites without organ or systems involvement: Secondary | ICD-10-CM | POA: Insufficient documentation

## 2024-06-12 DIAGNOSIS — Z87442 Personal history of urinary calculi: Secondary | ICD-10-CM | POA: Insufficient documentation

## 2024-06-12 DIAGNOSIS — Z8719 Personal history of other diseases of the digestive system: Secondary | ICD-10-CM | POA: Diagnosis not present

## 2024-06-12 DIAGNOSIS — M4004 Postural kyphosis, thoracic region: Secondary | ICD-10-CM | POA: Diagnosis not present

## 2024-06-12 DIAGNOSIS — M19071 Primary osteoarthritis, right ankle and foot: Secondary | ICD-10-CM | POA: Insufficient documentation

## 2024-06-12 DIAGNOSIS — M47816 Spondylosis without myelopathy or radiculopathy, lumbar region: Secondary | ICD-10-CM | POA: Diagnosis not present

## 2024-06-12 MED ORDER — SODIUM CHLORIDE 0.9 % IV SOLN
750.0000 mg | INTRAVENOUS | Status: DC
  Administered 2024-06-12: 750 mg via INTRAVENOUS
  Filled 2024-06-12: qty 30

## 2024-06-12 NOTE — Progress Notes (Signed)
 Office Visit Note  Patient: Tiffany Pollard             Date of Birth: [birth year]           MRN: [id number]             PCP: Maree Isles, MD Referring: Maree Isles, MD Visit Date: 06/26/2024 Occupation: @GUAROCC @  Subjective:  Medication monitoring   History of Present Illness: Tiffany Pollard is a 73 y.o. female with history of seropositive rheumatoid arthritis and osteoarthritis.  Patient remains on  Orencia  750 mg IV infusions every 28 days.  She tolerating Orencia  without any side effects and has not had any recent gaps in therapy.  Patient denies any breakthrough symptoms leading up to her Orencia  infusions recently.  Her morning stiffness has been lasting for about 30 minutes daily.  She has not noticed any joint swelling. Patient states that she has had several warts on her hands especially the right thumb.  She is under the care of dermatology and has been undergoing cryotherapy.   Activities of Daily Living:  Patient reports morning stiffness for 30 minutes.   Patient Reports nocturnal pain.  Difficulty dressing/grooming: Denies Difficulty climbing stairs: Denies Difficulty getting out of chair: Denies Difficulty using hands for taps, buttons, cutlery, and/or writing: Reports  Review of Systems  Constitutional:  Positive for fatigue.  HENT:  Positive for mouth dryness. Negative for mouth sores.   Eyes:  Positive for dryness.  Respiratory:  Positive for shortness of breath.   Cardiovascular:  Negative for chest pain and palpitations.  Gastrointestinal:  Negative for blood in stool, constipation and diarrhea.  Endocrine: Negative for increased urination.  Genitourinary:  Negative for involuntary urination.  Musculoskeletal:  Positive for joint pain, joint pain, myalgias, morning stiffness and myalgias. Negative for gait problem, joint swelling, muscle weakness and muscle tenderness.  Skin:  Negative for color change, rash, hair loss and sensitivity to sunlight.   Allergic/Immunologic: Positive for susceptible to infections.  Neurological:  Negative for dizziness and headaches.  Hematological:  Negative for swollen glands.  Psychiatric/Behavioral:  Negative for depressed mood and sleep disturbance. The patient is nervous/anxious.     PMFS History:  Patient Active Problem List   Diagnosis Date Noted   Seropositive rheumatoid arthritis of multiple sites (HCC) 10/12/2022   Primary osteoarthritis of both knees 04/30/2017   History of anemia 04/30/2017   History of migraine 04/30/2017   History of vertigo 04/30/2017   History of gastroesophageal reflux (GERD) 04/30/2017   History of kidney stones 04/30/2017   History of IBS 04/30/2017   History of hyperlipidemia 04/30/2017   Hypercalcemia 04/30/2017   Rheumatoid arthritis involving multiple sites with positive rheumatoid factor (HCC) 12/08/2016   High risk medication use 12/08/2016   Primary osteoarthritis of both hands 12/08/2016   Primary osteoarthritis of both feet 12/08/2016   Osteoarthritis of lumbar spine 12/08/2016   Former smoker 12/08/2016   Abdominal pain, chronic, right lower quadrant 12/11/2013    Past Medical History:  Diagnosis Date   GERD (gastroesophageal reflux disease)    High blood pressure    Rheumatoid arthritis(714.0)     History reviewed. No pertinent family history. Past Surgical History:  Procedure Laterality Date   BACK SURGERY     CERVICAL CONIZATION W/BX     COLONOSCOPY N/A 12/27/2013   Procedure: COLONOSCOPY;  Surgeon: Claudis RAYMOND Rivet, MD;  Location: [facility];  Service: Endoscopy;  Laterality: N/A;  320-moved to 125 Ann to notify pt  FINGER FRACTURE SURGERY     foot surgery for a bunion     NASAL SINUS SURGERY     TONSILLECTOMY     Social History   Social History Narrative   Not on file   Immunization History  Administered Date(s) Administered   Influenza, High Dose Seasonal PF 08/07/2019, 08/14/2020   Influenza,inj,Quad PF,6+ Mos 08/18/2017    PFIZER(Purple Top)SARS-COV-2 Vaccination 01/12/2020, 02/02/2020, 08/22/2020   Pneumococcal Conjugate-13 07/13/2018     Objective: Vital Signs: BP 115/68 (BP Location: Left Arm, Patient Position: Sitting, Cuff Size: Normal)   Pulse 81   Resp 16   Ht 5' 5 (1.651 m)   Wt 175 lb 12.8 oz (79.7 kg)   BMI 29.25 kg/m    Physical Exam Vitals and nursing note reviewed.  Constitutional:      Appearance: She is well-developed.  HENT:     Head: Normocephalic and atraumatic.  Eyes:     Conjunctiva/sclera: Conjunctivae normal.  Cardiovascular:     Rate and Rhythm: Normal rate and regular rhythm.     Heart sounds: Normal heart sounds.  Pulmonary:     Effort: Pulmonary effort is normal.     Breath sounds: Normal breath sounds.  Abdominal:     General: Bowel sounds are normal.     Palpations: Abdomen is soft.  Musculoskeletal:     Cervical back: Normal range of motion.  Lymphadenopathy:     Cervical: No cervical adenopathy.  Skin:    General: Skin is warm and dry.     Capillary Refill: Capillary refill takes less than 2 seconds.  Neurological:     Mental Status: She is alert and oriented to person, place, and time.  Psychiatric:        Behavior: Behavior normal.      Musculoskeletal Exam: C-spine has limited ROM with lateral rotation.  Postural thoracic kyphosis.  Shoulder joints, elbow joints, wrist joints MCPs, PIPs, DIPs have good range of motion with no synovitis. Complete fist formation bilaterally.  Hip joints have good ROM with no groin pain.  Knee joints have good ROM with no warmth or effusion.  Ankle joints have good ROM with no tenderness or joint swelling.  Dorsal spur noted on the right foot.  PIP and DIP thickening consistent with osteoarthritis of both feet noted.  CDAI Exam: CDAI Score: -- Patient Global: --; Provider Global: -- Swollen: --; Tender: -- Joint Exam 06/26/2024   No joint exam has been documented for this visit   There is currently no information  documented on the homunculus. Go to the Rheumatology activity and complete the homunculus joint exam.  Investigation: No additional findings.  Imaging: No results found.  Recent Labs: Lab Results  Component Value Date   WBC 7.7 05/12/2024   HGB 13.8 05/12/2024   PLT 188 05/12/2024   NA 139 05/12/2024   K 4.5 05/12/2024   CL 104 05/12/2024   CO2 25 05/12/2024   GLUCOSE 71 05/12/2024   BUN 19 05/12/2024   CREATININE 0.95 05/12/2024   BILITOT 0.5 05/12/2024   ALKPHOS 80 06/04/2023   AST 28 05/12/2024   ALT 28 05/12/2024   PROT 6.9 05/12/2024   ALBUMIN 4.1 06/04/2023   CALCIUM 9.6 05/12/2024   GFRAA >60 08/29/2020   QFTBGOLD Negative 03/11/2017   QFTBGOLDPLUS NEGATIVE 03/17/2024    Speciality Comments: Simply Aria IV-recurrent infections, Enbrel -insurance issues  Procedures:  No procedures performed Allergies: Tetanus toxoid   Assessment / Plan:     Visit Diagnoses: Rheumatoid  arthritis involving multiple sites with positive rheumatoid factor (HCC) - +RF, +CCP, ANA-: She has no synovitis on examination today.  She has not had any signs or symptoms of a rheumatoid arthritis flare.  She has clinically been doing well on Orencia  750 mg IV infusions every 28 days.  She is tolerating Orencia  without any side effects and has not had any recent gaps in therapy.  She has not been experiencing any breakthrough symptoms leading up to these infusions.  Her morning stiffness has been lasting for about 30 minutes daily but she has not noticed any joint swelling.  No medication changes will be made at this time.  She was advised to notify us  if she develops signs or symptoms of a flare.  She will follow-up in the office in 5 months or sooner if needed.  High risk medication use - Orencia  750 mg IV infusions every 28 days.  Previous therapy: d/c Enbrel  due to lack of insurance. D/c Simponi  due to recurrent infections. CBC and CMP updated on 05/12/24.  She will continue to have updated lab work  with infusions. TB gold negative on 03/17/24 Lipid panel updated on 02/14/24.  No recurrent infections.  Discussed the importance of holding orencia  if she develops signs or symptoms of an infection and to resume once the infection has completely cleared.    Primary osteoarthritis of both hands: She has PIP and DIP thickening consistent with osteoarthritis of both hands.  No synovitis noted on examination today.  Complete fist formation bilaterally.    Primary osteoarthritis of both knees: She has good range of motion of both knee joints on examination today.  No warmth or effusion noted.  Primary osteoarthritis of both feet: She has good range of motion of both ankle joints with no tenderness or joint swelling.  Dorsal spur noted on the right foot.  PIP and DIP thickening consistent with osteoarthritis of both feet noted.  Bronchiectasis without complication (HCC) - Under care of Dr. Terrial chest CT on 12/10/2023: Fibrotic changes in the lungs, predominantly manifest as scattered areas of asymmetrically distributed funky ectasis, grossly similar to prior study from 2019.  Multiple small pulmonary nodules grossly stable compared to prior study from 2019 noted. No recent exacerbations.   Spondylosis of lumbar region without myelopathy or radiculopathy: She is not experiencing any increased discomfort in her lower back at this time.  No symptoms of radiculopathy.  Pulmonary nodules - Chest CT ordered on 04/29/2023: 6 mm right middle lobe nodule slow interval growth over prior exams--could represent slow-growing malignancy. High resolution chest CT updated on 12/10/2023: Multiple small pulmonary nodules grossly stable compared to prior study from 2019, considered benign.  No imaging follow-up is recommended.  Age-related osteoporosis without current pathological fracture - DEXA updated on 05/14/2023: [facility] spine BMD 0.902 with T-score -2.3.  Use of calcium rich diet and vitamin D was advised. Due  to update DEXA in June 2026.  Postural kyphosis of thoracic region: Unchanged.  No tenderness.    Other medical conditions are listed as follows:  History of vertigo  History of kidney stones  History of migraine  Anxiety  History of gastroesophageal reflux (GERD)  History of IBS  History of anemia  History of hyperlipidemia  Former smoker  Orders: No orders of the defined types were placed in this encounter.  No orders of the defined types were placed in this encounter.   Follow-Up Instructions: Return in about 5 months (around 11/26/2024) for Rheumatoid arthritis.   [place]  CHRISTELLA Craze, PA-C  Note - This record has been created using [employer].  Chart creation errors have been sought, but may not always  have been located. Such creation errors do not reflect on  the standard of medical care.

## 2024-06-12 NOTE — Progress Notes (Signed)
 Diagnosis: Rheumatoid Arthritis  Provider:  Dolphus Reiter MD  Procedure: IV Infusion  IV Type: Peripheral, IV Location: L Antecubital  Orencia  (Abatacept ), Dose: 750 mg  Infusion Start Time: 0924  Infusion Stop Time: 0954  Post Infusion IV Care: Observation period completed  Discharge: Condition: Good, Destination: Home . AVS Provided  Performed by:  Baldwin Darice Helling, RN

## 2024-06-21 DIAGNOSIS — I10 Essential (primary) hypertension: Secondary | ICD-10-CM | POA: Diagnosis not present

## 2024-06-21 DIAGNOSIS — J449 Chronic obstructive pulmonary disease, unspecified: Secondary | ICD-10-CM | POA: Diagnosis not present

## 2024-06-21 DIAGNOSIS — M069 Rheumatoid arthritis, unspecified: Secondary | ICD-10-CM | POA: Diagnosis not present

## 2024-06-21 DIAGNOSIS — Z299 Encounter for prophylactic measures, unspecified: Secondary | ICD-10-CM | POA: Diagnosis not present

## 2024-06-21 DIAGNOSIS — E7800 Pure hypercholesterolemia, unspecified: Secondary | ICD-10-CM | POA: Diagnosis not present

## 2024-06-21 DIAGNOSIS — J479 Bronchiectasis, uncomplicated: Secondary | ICD-10-CM | POA: Diagnosis not present

## 2024-06-26 ENCOUNTER — Encounter: Payer: Self-pay | Admitting: Physician Assistant

## 2024-06-26 ENCOUNTER — Encounter (INDEPENDENT_AMBULATORY_CARE_PROVIDER_SITE_OTHER): Payer: Medicare Other | Admitting: Physician Assistant

## 2024-06-26 VITALS — BP 115/68 | HR 81 | Resp 16 | Ht 65.0 in | Wt 175.8 lb

## 2024-06-26 DIAGNOSIS — M4004 Postural kyphosis, thoracic region: Secondary | ICD-10-CM | POA: Diagnosis not present

## 2024-06-26 DIAGNOSIS — M19042 Primary osteoarthritis, left hand: Secondary | ICD-10-CM

## 2024-06-26 DIAGNOSIS — J479 Bronchiectasis, uncomplicated: Secondary | ICD-10-CM

## 2024-06-26 DIAGNOSIS — M0579 Rheumatoid arthritis with rheumatoid factor of multiple sites without organ or systems involvement: Secondary | ICD-10-CM

## 2024-06-26 DIAGNOSIS — M810 Age-related osteoporosis without current pathological fracture: Secondary | ICD-10-CM

## 2024-06-26 DIAGNOSIS — R918 Other nonspecific abnormal finding of lung field: Secondary | ICD-10-CM

## 2024-06-26 DIAGNOSIS — M19071 Primary osteoarthritis, right ankle and foot: Secondary | ICD-10-CM

## 2024-06-26 DIAGNOSIS — Z79899 Other long term (current) drug therapy: Secondary | ICD-10-CM | POA: Diagnosis not present

## 2024-06-26 DIAGNOSIS — Z87891 Personal history of nicotine dependence: Secondary | ICD-10-CM

## 2024-06-26 DIAGNOSIS — Z87442 Personal history of urinary calculi: Secondary | ICD-10-CM

## 2024-06-26 DIAGNOSIS — Z8669 Personal history of other diseases of the nervous system and sense organs: Secondary | ICD-10-CM

## 2024-06-26 DIAGNOSIS — M19041 Primary osteoarthritis, right hand: Secondary | ICD-10-CM | POA: Diagnosis not present

## 2024-06-26 DIAGNOSIS — Z87898 Personal history of other specified conditions: Secondary | ICD-10-CM | POA: Diagnosis not present

## 2024-06-26 DIAGNOSIS — M19072 Primary osteoarthritis, left ankle and foot: Secondary | ICD-10-CM

## 2024-06-26 DIAGNOSIS — M170 Bilateral primary osteoarthritis of knee: Secondary | ICD-10-CM | POA: Diagnosis not present

## 2024-06-26 DIAGNOSIS — F419 Anxiety disorder, unspecified: Secondary | ICD-10-CM

## 2024-06-26 DIAGNOSIS — M47816 Spondylosis without myelopathy or radiculopathy, lumbar region: Secondary | ICD-10-CM | POA: Diagnosis not present

## 2024-06-26 DIAGNOSIS — Z8719 Personal history of other diseases of the digestive system: Secondary | ICD-10-CM

## 2024-06-26 DIAGNOSIS — Z862 Personal history of diseases of the blood and blood-forming organs and certain disorders involving the immune mechanism: Secondary | ICD-10-CM

## 2024-06-26 DIAGNOSIS — Z8639 Personal history of other endocrine, nutritional and metabolic disease: Secondary | ICD-10-CM

## 2024-07-03 ENCOUNTER — Ambulatory Visit (HOSPITAL_COMMUNITY)
Admission: RE | Admit: 2024-07-03 | Discharge: 2024-07-03 | Disposition: A | Discharge status: Home / Self Care | Source: Ambulatory Visit | Attending: Internal Medicine | Admitting: Internal Medicine

## 2024-07-03 DIAGNOSIS — Z1231 Encounter for screening mammogram for malignant neoplasm of breast: Secondary | ICD-10-CM | POA: Diagnosis not present

## 2024-07-04 DIAGNOSIS — T63441A Toxic effect of venom of bees, accidental (unintentional), initial encounter: Secondary | ICD-10-CM | POA: Diagnosis not present

## 2024-07-04 DIAGNOSIS — L509 Urticaria, unspecified: Secondary | ICD-10-CM | POA: Diagnosis not present

## 2024-07-14 ENCOUNTER — Ambulatory Visit

## 2024-07-14 DIAGNOSIS — I10 Essential (primary) hypertension: Secondary | ICD-10-CM | POA: Diagnosis not present

## 2024-07-14 DIAGNOSIS — J069 Acute upper respiratory infection, unspecified: Secondary | ICD-10-CM | POA: Diagnosis not present

## 2024-07-14 DIAGNOSIS — J029 Acute pharyngitis, unspecified: Secondary | ICD-10-CM | POA: Diagnosis not present

## 2024-07-14 DIAGNOSIS — R093 Abnormal sputum: Secondary | ICD-10-CM | POA: Diagnosis not present

## 2024-07-14 DIAGNOSIS — R059 Cough, unspecified: Secondary | ICD-10-CM | POA: Diagnosis not present

## 2024-07-14 DIAGNOSIS — Z299 Encounter for prophylactic measures, unspecified: Secondary | ICD-10-CM | POA: Diagnosis not present

## 2024-07-21 ENCOUNTER — Other Ambulatory Visit: Payer: Self-pay | Admitting: Emergency Medicine

## 2024-07-21 ENCOUNTER — Encounter: Attending: Rheumatology | Admitting: Internal Medicine

## 2024-07-21 VITALS — BP 116/70 | HR 74 | Temp 97.8°F | Resp 16

## 2024-07-21 DIAGNOSIS — M0579 Rheumatoid arthritis with rheumatoid factor of multiple sites without organ or systems involvement: Secondary | ICD-10-CM

## 2024-07-21 MED ORDER — SODIUM CHLORIDE 0.9 % IV SOLN
750.0000 mg | INTRAVENOUS | Status: DC
  Administered 2024-07-21: 750 mg via INTRAVENOUS
  Filled 2024-07-21: qty 30

## 2024-07-21 NOTE — Progress Notes (Signed)
 Diagnosis: Rheumatoid Arthritis  Provider:  Dolphus Reiter MD  Procedure: IV Infusion  IV Type: Peripheral, IV Location: L Antecubital  Orencia  (Abatacept ), Dose: 750 mg  Infusion Start Time: 0950  Infusion Stop Time: 1020  Post Infusion IV Care: Observation period completed  Discharge: Condition: Good, Destination: Home . AVS Provided  Performed by:  Blanca Selinda SAUNDERS, LPN

## 2024-07-22 ENCOUNTER — Ambulatory Visit: Payer: Self-pay | Admitting: Rheumatology

## 2024-07-22 LAB — COMPREHENSIVE METABOLIC PANEL WITH GFR
AG Ratio: 2 (calc) (ref 1.0–2.5)
ALT: 24 U/L (ref 6–29)
AST: 20 U/L (ref 10–35)
Albumin: 4.5 g/dL (ref 3.6–5.1)
Alkaline phosphatase (APISO): 88 U/L (ref 37–153)
BUN: 25 mg/dL (ref 7–25)
CO2: 29 mmol/L (ref 20–32)
Calcium: 9.9 mg/dL (ref 8.6–10.4)
Chloride: 100 mmol/L (ref 98–110)
Creat: 0.86 mg/dL (ref 0.60–1.00)
Globulin: 2.2 g/dL (ref 1.9–3.7)
Glucose, Bld: 79 mg/dL (ref 65–99)
Potassium: 4.5 mmol/L (ref 3.5–5.3)
Sodium: 138 mmol/L (ref 135–146)
Total Bilirubin: 0.5 mg/dL (ref 0.2–1.2)
Total Protein: 6.7 g/dL (ref 6.1–8.1)
eGFR: 72 mL/min/1.73m2 (ref >=60)

## 2024-07-22 LAB — CBC WITH DIFFERENTIAL/PLATELET
Absolute Lymphocytes: 3271 {cells}/uL (ref 850–3900)
Absolute Monocytes: 1015 {cells}/uL — ABNORMAL HIGH (ref 200–950)
Basophils Absolute: 66 {cells}/uL (ref 0–200)
Basophils Relative: 0.7 %
Eosinophils Absolute: 395 {cells}/uL (ref 15–500)
Eosinophils Relative: 4.2 %
HCT: 46.1 % — ABNORMAL HIGH (ref 35.0–45.0)
Hemoglobin: 15.1 g/dL (ref 11.7–15.5)
MCH: 29.2 pg (ref 27.0–33.0)
MCHC: 32.8 g/dL (ref 32.0–36.0)
MCV: 89.2 fL (ref 80.0–100.0)
MPV: 9.9 fL (ref 7.5–12.5)
Monocytes Relative: 10.8 %
Neutro Abs: 4653 {cells}/uL (ref 1500–7800)
Neutrophils Relative %: 49.5 %
Platelets: 264 Thousand/uL (ref 140–400)
RBC: 5.17 Million/uL — ABNORMAL HIGH (ref 3.80–5.10)
RDW: 12.9 % (ref 11.0–15.0)
Total Lymphocyte: 34.8 %
WBC: 9.4 Thousand/uL (ref 3.8–10.8)

## 2024-07-22 NOTE — Progress Notes (Signed)
CBC is stable.

## 2024-08-01 DIAGNOSIS — B079 Viral wart, unspecified: Secondary | ICD-10-CM | POA: Diagnosis not present

## 2024-08-15 DIAGNOSIS — H2513 Age-related nuclear cataract, bilateral: Secondary | ICD-10-CM | POA: Diagnosis not present

## 2024-08-15 DIAGNOSIS — H40013 Open angle with borderline findings, low risk, bilateral: Secondary | ICD-10-CM | POA: Diagnosis not present

## 2024-08-17 DIAGNOSIS — Z299 Encounter for prophylactic measures, unspecified: Secondary | ICD-10-CM | POA: Diagnosis not present

## 2024-08-17 DIAGNOSIS — J029 Acute pharyngitis, unspecified: Secondary | ICD-10-CM | POA: Diagnosis not present

## 2024-08-17 DIAGNOSIS — Z6829 Body mass index (BMI) 29.0-29.9, adult: Secondary | ICD-10-CM | POA: Diagnosis not present

## 2024-08-17 DIAGNOSIS — I10 Essential (primary) hypertension: Secondary | ICD-10-CM | POA: Diagnosis not present

## 2024-08-18 ENCOUNTER — Ambulatory Visit

## 2024-08-24 DIAGNOSIS — B079 Viral wart, unspecified: Secondary | ICD-10-CM | POA: Diagnosis not present

## 2024-08-25 ENCOUNTER — Encounter: Attending: Rheumatology | Admitting: *Deleted

## 2024-08-25 ENCOUNTER — Ambulatory Visit

## 2024-08-25 VITALS — BP 122/65 | HR 70 | Temp 98.0°F | Resp 16

## 2024-08-25 DIAGNOSIS — M0579 Rheumatoid arthritis with rheumatoid factor of multiple sites without organ or systems involvement: Secondary | ICD-10-CM | POA: Insufficient documentation

## 2024-08-25 MED ORDER — SODIUM CHLORIDE 0.9 % IV SOLN
750.0000 mg | INTRAVENOUS | Status: DC
  Administered 2024-08-25: 750 mg via INTRAVENOUS
  Filled 2024-08-25: qty 30

## 2024-08-25 NOTE — Progress Notes (Signed)
 Diagnosis: Rheumatoid Arthritis  Provider:  Dolphus Reiter MD  Procedure: IV Infusion  IV Type: Peripheral, IV Location: L Antecubital  Orencia  (Abatacept ), Dose: 750 mg  Infusion Start Time: 0941  Infusion Stop Time: 1015  Post Infusion IV Care: Observation period completed  Discharge: Condition: Good, Destination: Home . AVS Declined  Performed by:  Baldwin Darice Helling, RN

## 2024-09-02 DIAGNOSIS — J029 Acute pharyngitis, unspecified: Secondary | ICD-10-CM | POA: Diagnosis not present

## 2024-09-02 DIAGNOSIS — R051 Acute cough: Secondary | ICD-10-CM | POA: Diagnosis not present

## 2024-09-02 DIAGNOSIS — B370 Candidal stomatitis: Secondary | ICD-10-CM | POA: Diagnosis not present

## 2024-09-11 DIAGNOSIS — Z23 Encounter for immunization: Secondary | ICD-10-CM | POA: Diagnosis not present

## 2024-09-13 DIAGNOSIS — Z23 Encounter for immunization: Secondary | ICD-10-CM | POA: Diagnosis not present

## 2024-09-14 DIAGNOSIS — B079 Viral wart, unspecified: Secondary | ICD-10-CM | POA: Diagnosis not present

## 2024-09-18 DIAGNOSIS — E7800 Pure hypercholesterolemia, unspecified: Secondary | ICD-10-CM | POA: Diagnosis not present

## 2024-09-18 DIAGNOSIS — J449 Chronic obstructive pulmonary disease, unspecified: Secondary | ICD-10-CM | POA: Diagnosis not present

## 2024-09-18 DIAGNOSIS — M069 Rheumatoid arthritis, unspecified: Secondary | ICD-10-CM | POA: Diagnosis not present

## 2024-09-18 DIAGNOSIS — Z299 Encounter for prophylactic measures, unspecified: Secondary | ICD-10-CM | POA: Diagnosis not present

## 2024-09-18 DIAGNOSIS — I10 Essential (primary) hypertension: Secondary | ICD-10-CM | POA: Diagnosis not present

## 2024-09-22 ENCOUNTER — Encounter: Attending: Rheumatology | Admitting: *Deleted

## 2024-09-22 ENCOUNTER — Other Ambulatory Visit: Payer: Self-pay | Admitting: Emergency Medicine

## 2024-09-22 VITALS — BP 144/74 | HR 76 | Temp 98.0°F | Resp 16

## 2024-09-22 DIAGNOSIS — M0579 Rheumatoid arthritis with rheumatoid factor of multiple sites without organ or systems involvement: Secondary | ICD-10-CM

## 2024-09-22 MED ORDER — SODIUM CHLORIDE 0.9 % IV SOLN
750.0000 mg | INTRAVENOUS | Status: DC
  Administered 2024-09-22: 750 mg via INTRAVENOUS
  Filled 2024-09-22: qty 30

## 2024-09-22 NOTE — Progress Notes (Signed)
 Diagnosis: Rheumatoid Arthritis  Provider:  Dolphus Reiter MD  Procedure: IV Infusion  IV Type: Peripheral, IV Location: L Antecubital  Orencia  (Abatacept ), Dose: 750 mg  Infusion Start Time: 0941  Infusion Stop Time: 1010  Post Infusion IV Care: Observation period completed  Discharge: Condition: Good, Destination: Home . AVS Provided  Performed by:  Baldwin Darice Helling, RN

## 2024-09-23 ENCOUNTER — Ambulatory Visit: Payer: Self-pay | Admitting: Rheumatology

## 2024-09-23 LAB — COMPREHENSIVE METABOLIC PANEL WITH GFR
AG Ratio: 1.9 (calc) (ref 1.0–2.5)
ALT: 25 U/L (ref 6–29)
AST: 22 U/L (ref 10–35)
Albumin: 4.7 g/dL (ref 3.6–5.1)
Alkaline phosphatase (APISO): 79 U/L (ref 37–153)
BUN: 19 mg/dL (ref 7–25)
CO2: 28 mmol/L (ref 20–32)
Calcium: 10.1 mg/dL (ref 8.6–10.4)
Chloride: 104 mmol/L (ref 98–110)
Creat: 0.78 mg/dL (ref 0.60–1.00)
Globulin: 2.5 g/dL (ref 1.9–3.7)
Glucose, Bld: 62 mg/dL — ABNORMAL LOW (ref 65–99)
Potassium: 4.3 mmol/L (ref 3.5–5.3)
Sodium: 140 mmol/L (ref 135–146)
Total Bilirubin: 0.3 mg/dL (ref 0.2–1.2)
Total Protein: 7.2 g/dL (ref 6.1–8.1)
eGFR: 81 mL/min/1.73m2 (ref >=60)

## 2024-09-23 LAB — CBC WITH DIFFERENTIAL/PLATELET
Absolute Lymphocytes: 2265 {cells}/uL (ref 850–3900)
Absolute Monocytes: 768 {cells}/uL (ref 200–950)
Basophils Absolute: 61 {cells}/uL (ref 0–200)
Basophils Relative: 0.8 %
Eosinophils Absolute: 312 {cells}/uL (ref 15–500)
Eosinophils Relative: 4.1 %
HCT: 44.7 % (ref 35.0–45.0)
Hemoglobin: 14.4 g/dL (ref 11.7–15.5)
MCH: 29.1 pg (ref 27.0–33.0)
MCHC: 32.2 g/dL (ref 32.0–36.0)
MCV: 90.5 fL (ref 80.0–100.0)
MPV: 9.6 fL (ref 7.5–12.5)
Monocytes Relative: 10.1 %
Neutro Abs: 4195 {cells}/uL (ref 1500–7800)
Neutrophils Relative %: 55.2 %
Platelets: 221 Thousand/uL (ref 140–400)
RBC: 4.94 Million/uL (ref 3.80–5.10)
RDW: 13.2 % (ref 11.0–15.0)
Total Lymphocyte: 29.8 %
WBC: 7.6 Thousand/uL (ref 3.8–10.8)

## 2024-10-05 DIAGNOSIS — B079 Viral wart, unspecified: Secondary | ICD-10-CM | POA: Diagnosis not present

## 2024-10-05 DIAGNOSIS — L719 Rosacea, unspecified: Secondary | ICD-10-CM | POA: Diagnosis not present

## 2024-10-19 DIAGNOSIS — Z6830 Body mass index (BMI) 30.0-30.9, adult: Secondary | ICD-10-CM | POA: Diagnosis not present

## 2024-10-19 DIAGNOSIS — R030 Elevated blood-pressure reading, without diagnosis of hypertension: Secondary | ICD-10-CM | POA: Diagnosis not present

## 2024-10-19 DIAGNOSIS — E669 Obesity, unspecified: Secondary | ICD-10-CM | POA: Diagnosis not present

## 2024-10-19 DIAGNOSIS — H00019 Hordeolum externum unspecified eye, unspecified eyelid: Secondary | ICD-10-CM | POA: Diagnosis not present

## 2024-10-19 DIAGNOSIS — K1120 Sialoadenitis, unspecified: Secondary | ICD-10-CM | POA: Diagnosis not present

## 2024-10-20 ENCOUNTER — Ambulatory Visit

## 2024-10-23 DIAGNOSIS — Z299 Encounter for prophylactic measures, unspecified: Secondary | ICD-10-CM | POA: Diagnosis not present

## 2024-10-23 DIAGNOSIS — M069 Rheumatoid arthritis, unspecified: Secondary | ICD-10-CM | POA: Diagnosis not present

## 2024-10-23 DIAGNOSIS — I889 Nonspecific lymphadenitis, unspecified: Secondary | ICD-10-CM | POA: Diagnosis not present

## 2024-10-23 DIAGNOSIS — J449 Chronic obstructive pulmonary disease, unspecified: Secondary | ICD-10-CM | POA: Diagnosis not present

## 2024-10-23 DIAGNOSIS — R52 Pain, unspecified: Secondary | ICD-10-CM | POA: Diagnosis not present

## 2024-10-23 DIAGNOSIS — I10 Essential (primary) hypertension: Secondary | ICD-10-CM | POA: Diagnosis not present

## 2024-10-23 DIAGNOSIS — J479 Bronchiectasis, uncomplicated: Secondary | ICD-10-CM | POA: Diagnosis not present

## 2024-10-27 ENCOUNTER — Ambulatory Visit

## 2024-11-10 ENCOUNTER — Ambulatory Visit

## 2024-11-13 ENCOUNTER — Telehealth: Payer: Self-pay

## 2024-11-13 DIAGNOSIS — Z6830 Body mass index (BMI) 30.0-30.9, adult: Secondary | ICD-10-CM | POA: Diagnosis not present

## 2024-11-13 DIAGNOSIS — J392 Other diseases of pharynx: Secondary | ICD-10-CM | POA: Diagnosis not present

## 2024-11-13 DIAGNOSIS — E669 Obesity, unspecified: Secondary | ICD-10-CM | POA: Diagnosis not present

## 2024-11-13 DIAGNOSIS — M059 Rheumatoid arthritis with rheumatoid factor, unspecified: Secondary | ICD-10-CM | POA: Diagnosis not present

## 2024-11-13 NOTE — Telephone Encounter (Signed)
 Patient will need to cancel the infusion if she has an infection.  She will require clearance by ENT resume the infusion.

## 2024-11-13 NOTE — Telephone Encounter (Signed)
 Patient advised Patient will need to cancel the infusion if she has an infection.  She will require clearance by ENT resume the infusion. Patient verbalized understanding.

## 2024-11-13 NOTE — Telephone Encounter (Signed)
 Patient contacted the office and states she had missed her last Orencia  infustion due to infected gland issues. Patient states she has an infusion scheduled for tomorrow. Patient states she now has an abscess hear where her tonsil would be. Patient states she went to urgent care and was referred to an ENT. Patient would just like to clarify that she should cancel her infusion appointment or if she would be okay to keep it. Please advise.

## 2024-11-14 ENCOUNTER — Ambulatory Visit

## 2024-11-21 DIAGNOSIS — R5383 Other fatigue: Secondary | ICD-10-CM | POA: Diagnosis not present

## 2024-11-21 DIAGNOSIS — Z1331 Encounter for screening for depression: Secondary | ICD-10-CM | POA: Diagnosis not present

## 2024-11-21 DIAGNOSIS — R52 Pain, unspecified: Secondary | ICD-10-CM | POA: Diagnosis not present

## 2024-11-21 DIAGNOSIS — Z299 Encounter for prophylactic measures, unspecified: Secondary | ICD-10-CM | POA: Diagnosis not present

## 2024-11-21 DIAGNOSIS — Z0000 Encounter for general adult medical examination without abnormal findings: Secondary | ICD-10-CM | POA: Diagnosis not present

## 2024-11-21 DIAGNOSIS — Z7189 Other specified counseling: Secondary | ICD-10-CM | POA: Diagnosis not present

## 2024-11-21 DIAGNOSIS — Z79899 Other long term (current) drug therapy: Secondary | ICD-10-CM | POA: Diagnosis not present

## 2024-11-21 DIAGNOSIS — E559 Vitamin D deficiency, unspecified: Secondary | ICD-10-CM | POA: Diagnosis not present

## 2024-11-21 DIAGNOSIS — Z1339 Encounter for screening examination for other mental health and behavioral disorders: Secondary | ICD-10-CM | POA: Diagnosis not present

## 2024-11-21 DIAGNOSIS — E7800 Pure hypercholesterolemia, unspecified: Secondary | ICD-10-CM | POA: Diagnosis not present

## 2024-11-28 NOTE — Progress Notes (Signed)
 "  Office Visit Note  Patient: Tiffany Pollard             Date of Birth: 11-Apr-1951           MRN: [id number]             PCP: Maree Isles, MD Referring: Maree Isles, MD Visit Date: 12/12/2024 Occupation: Data Unavailable  Subjective:  Pain in both knees   History of Present Illness: Tiffany Pollard is a 73 y.o. female with history of seropositive rheumatoid arthritis.  Patient is prescribed Orencia  750 mg IV infusions every 28 days but states that the last infusion was administered on 09/22/2024.  Patient reports that she has been experiencing chronic sinus congestion and has been unsure if it is due to an infection or allergies.  Patient states that the congestion worsens with environmental exposures.  She has not noticed any improvement in the symptoms while holding Orencia  or when previously treated with antibiotics.  Patient states that she has not had any fevers.  Patient is scheduled to establish care with ENT tomorrow.  Patient states that she is also scheduled to follow-up with pulmonology on 12/29/2024.  She will be having a chest CT on 12/21/2024 to monitor pulmonary nodules. Patient states that while off of her NSAID she has been experiencing increased generalized arthralgias.  The symptoms have been no severe affecting both knees.  She is been experiencing nocturnal pain in both knees as well as increased neck pain and stiffness for the past 1 week.  She is been taking Tylenol  arthritis very sparingly for pain relief.  She has taken Aleve a few times for pain relief.  She denies any joint swelling.    Activities of Daily Living:  Patient reports joint stiffness all day  Patient Reports nocturnal pain.  Difficulty dressing/grooming: Denies Difficulty climbing stairs: Reports Difficulty getting out of chair: Reports Difficulty using hands for taps, buttons, cutlery, and/or writing: Reports  Review of Systems  Constitutional:  Positive for fatigue.  HENT:  Positive for mouth  dryness and nose dryness. Negative for mouth sores.   Eyes:  Positive for dryness. Negative for pain and visual disturbance.  Respiratory:  Negative for cough, hemoptysis, shortness of breath and difficulty breathing.   Cardiovascular:  Negative for chest pain, palpitations, hypertension and swelling in legs/feet.  Gastrointestinal:  Negative for blood in stool, constipation and diarrhea.  Endocrine: Negative for increased urination.  Genitourinary:  Negative for painful urination.  Musculoskeletal:  Positive for joint pain, joint pain and morning stiffness. Negative for joint swelling, myalgias, muscle weakness, muscle tenderness and myalgias.  Skin:  Negative for color change, pallor, rash, hair loss, nodules/bumps, skin tightness, ulcers and sensitivity to sunlight.  Allergic/Immunologic: Negative for susceptible to infections.  Neurological:  Negative for dizziness, numbness, headaches and weakness.  Hematological:  Negative for swollen glands.  Psychiatric/Behavioral:  Positive for sleep disturbance. Negative for depressed mood. The patient is not nervous/anxious.     PMFS History:  Patient Active Problem List   Diagnosis Date Noted   Seropositive rheumatoid arthritis of multiple sites (HCC) 10/12/2022   Primary osteoarthritis of both knees 04/30/2017   History of anemia 04/30/2017   History of migraine 04/30/2017   History of vertigo 04/30/2017   History of gastroesophageal reflux (GERD) 04/30/2017   History of kidney stones 04/30/2017   History of IBS 04/30/2017   History of hyperlipidemia 04/30/2017   Hypercalcemia 04/30/2017   Rheumatoid arthritis involving multiple sites with positive rheumatoid factor (HCC)  12/08/2016   High risk medication use 12/08/2016   Primary osteoarthritis of both hands 12/08/2016   Primary osteoarthritis of both feet 12/08/2016   Osteoarthritis of lumbar spine 12/08/2016   Former smoker 12/08/2016   Abdominal pain, chronic, right lower quadrant  12/11/2013    Past Medical History:  Diagnosis Date   GERD (gastroesophageal reflux disease)    High blood pressure    Rheumatoid arthritis(714.0)     History reviewed. No pertinent family history. Past Surgical History:  Procedure Laterality Date   BACK SURGERY     CERVICAL CONIZATION W/BX     COLONOSCOPY N/A 12/27/2013   Procedure: COLONOSCOPY;  Surgeon: Claudis RAYMOND Rivet, MD;  Location: [facility];  Service: Endoscopy;  Laterality: N/A;  320-moved to 125 Ann to notify pt   FINGER FRACTURE SURGERY     foot surgery for a bunion     NASAL SINUS SURGERY     TONSILLECTOMY     Social History[1] Social History   Social History Narrative   Not on file     Immunization History  Administered Date(s) Administered   INFLUENZA, HIGH DOSE SEASONAL PF 08/07/2019, 08/14/2020   Influenza,inj,Quad PF,6+ Mos 08/18/2017   PFIZER(Purple Top)SARS-COV-2 Vaccination 01/12/2020, 02/02/2020, 08/22/2020   Pneumococcal Conjugate-13 07/13/2018     Objective: Vital Signs: BP 137/81   Pulse (!) 105   Temp 98.4 F (36.9 C)   Resp 14   Ht 5' 5 (1.651 m)   Wt 181 lb 9.6 oz (82.4 kg)   BMI 30.22 kg/m    Physical Exam Vitals and nursing note reviewed.  Constitutional:      Appearance: She is well-developed.  HENT:     Head: Normocephalic and atraumatic.  Eyes:     Conjunctiva/sclera: Conjunctivae normal.  Cardiovascular:     Rate and Rhythm: Normal rate and regular rhythm.     Heart sounds: Normal heart sounds.  Pulmonary:     Effort: Pulmonary effort is normal.     Breath sounds: Normal breath sounds.  Abdominal:     General: Bowel sounds are normal.     Palpations: Abdomen is soft.  Musculoskeletal:     Cervical back: Normal range of motion.  Lymphadenopathy:     Cervical: No cervical adenopathy.  Skin:    General: Skin is warm and dry.     Capillary Refill: Capillary refill takes less than 2 seconds.  Neurological:     Mental Status: She is alert and oriented to person,  place, and time.  Psychiatric:        Behavior: Behavior normal.      Musculoskeletal Exam: C-spine has painful and limited range of motion without rotation.  Discomfort with mobility of the lumbar spine.  Postural thoracic kyphosis noted.  No midline spinal tenderness.  No SI joint tenderness.  Shoulder joints, elbow joints, wrist joints, MCPs, PIPs, DIPs have good range of motion with no synovitis.  Complete fist formation bilaterally.  Hip joints have good range of motion with no groin pain.  Knee joints have good range of motion no warmth or effusion.  Painful range of motion of both knees with crepitus in the right knee.  Ankle joints have good range of motion no tenderness or joint swelling.     CDAI Exam: CDAI Score: -- Patient Global: --; Provider Global: -- Swollen: --; Tender: -- Joint Exam 12/12/2024   No joint exam has been documented for this visit   There is currently no information documented on the  homunculus. Go to the Rheumatology activity and complete the homunculus joint exam.  Investigation: No additional findings.  Imaging: No results found.  Recent Labs: Lab Results  Component Value Date   WBC 7.6 09/22/2024   HGB 14.4 09/22/2024   PLT 221 09/22/2024   NA 140 09/22/2024   K 4.3 09/22/2024   CL 104 09/22/2024   CO2 28 09/22/2024   GLUCOSE 62 (L) 09/22/2024   BUN 19 09/22/2024   CREATININE 0.78 09/22/2024   BILITOT 0.3 09/22/2024   ALKPHOS 80 06/04/2023   AST 22 09/22/2024   ALT 25 09/22/2024   PROT 7.2 09/22/2024   ALBUMIN 4.1 06/04/2023   CALCIUM 10.1 09/22/2024   GFRAA >60 08/29/2020   QFTBGOLD Negative 03/11/2017   QFTBGOLDPLUS NEGATIVE 03/17/2024    Speciality Comments: Simply Aria IV-recurrent infections, Enbrel -insurance issues  Procedures:  No procedures performed Allergies: Tetanus toxoid     Assessment / Plan:     Visit Diagnoses: Rheumatoid arthritis involving multiple sites with positive rheumatoid factor (HCC) - +RF, +CCP,  ANA-: Patient presents today with an exacerbation of pain affecting multiple joints.  Her symptoms have been most severe affecting both knees as well as the cervical spine.  She has been experiencing nocturnal pain in both knees as well as a generalized aching.  She has not noticed any joint swelling.  The patient's last infusion of Orencia  was administered on 09/22/2024.  She has been holding Orencia  due to recurrent sinusitis.  According to the patient has been unclear if the sinus congestion is related to underlying allergies or an infectious etiology.  Patient has had no response with antibiotics or while holding Orencia .  Her symptoms are exacerbated by environmental exposures.  She is scheduled to establish care with ENT tomorrow on 12/13/2024--plan to resume Orencia  once she receives clearance that there is no infectious etiology contributing to the symptoms. In the meantime she plans on taking Tylenol  as needed for pain relief.  She will notify us  if her symptoms persist or worsen.  She will follow-up in the office in 5 months or sooner if needed.  High risk medication use - Orencia  750 mg IV infusions every 28 days.  Previous therapy: d/c Enbrel  due to lack of insurance. D/c Simponi  due to recurrent infections. CBC and CMP updated on 11/21/24. Orders for CBC and CMP released today.  TB gold negative on 03/17/24.   Lipid panel updated on 11/21/24  Discussed the importance of holding orencia  if she develops signs or symptoms of an infection and to resume once the infection has completely cleared.   Primary osteoarthritis of both hands: She is experiencing increased aching in both hands but no synovitis was noted.   Primary osteoarthritis of both knees: Patient presents today with exacerbation of pain and stiffness affecting both knees.  Crepitus of the right knee noted.  No warmth or effusion noted. She has been experiencing aching in both knees at night.  She takes Tylenol  as needed for pain  relief.  Primary osteoarthritis of both feet: Patient is experiencing some increased soreness and stiffness affecting both ankles.  She has some tenderness along the ankle joint line.  Dorsal spurs noted.  No synovitis was noted today.  She is wearing proper fitting shoes.  Bronchiectasis without complication [facility]): Scheduled for a high-resolution chest CT on 12/21/2024.  Scheduled to follow-up with pulmonology on 12/29/2024.  Spondylosis of lumbar region without myelopathy or radiculopathy: Patient's been experiencing exacerbation of discomfort and stiffness affecting the lower back.  She has continued to try to use a walking pad 6 days a week to preserve her mobility and strength.  Pulmonary nodules: Chest CT ordered on 04/29/2023: 6 mm right middle lobe nodule slow interval growth over prior exams--could represent slow-growing malignancy. High resolution chest CT updated on 12/10/2023: Multiple small pulmonary nodules grossly stable compared to prior study from 2019, considered benign.  Scheduled for high-resolution chest CT on 12/21/24  Age-related osteoporosis without current pathological fracture - DEXA updated on 05/14/2023: [facility] spine BMD 0.902 with T-score -2.3. Use of calcium rich diet and vitamin D was advised. Due to update DEXA in June 2026.  Postural kyphosis of thoracic region  Other medical conditions are listed as follows:  History of vertigo  History of kidney stones  History of migraine  Anxiety  History of gastroesophageal reflux (GERD)  History of IBS  History of anemia  History of hyperlipidemia  Former smoker  Orders: No orders of the defined types were placed in this encounter.  No orders of the defined types were placed in this encounter.     Follow-Up Instructions: Return in about 5 months (around 05/12/2025) for Rheumatoid arthritis.   Tiffany CHRISTELLA Craze, PA-C  Note - This record has been created using Dragon software.  Chart creation errors have been  sought, but may not always  have been located. Such creation errors do not reflect on  the standard of medical care.     [1]  Social History Tobacco Use   Smoking status: Former    Current packs/day: 0.00    Average packs/day: 1.3 packs/day for 30.0 years (37.5 ttl pk-yrs)    Types: Cigarettes    Start date: 01/04/1974    Quit date: 01/05/2004    Years since quitting: 20.9    Passive exposure: Past   Smokeless tobacco: Never  Vaping Use   Vaping status: Never Used  Substance Use Topics   Alcohol  use: No   Drug use: No   "

## 2024-12-12 ENCOUNTER — Encounter: Payer: Self-pay | Admitting: Physician Assistant

## 2024-12-12 ENCOUNTER — Encounter: Attending: Rheumatology | Admitting: Physician Assistant

## 2024-12-12 VITALS — BP 137/81 | HR 105 | Temp 98.4°F | Resp 14 | Ht 65.0 in | Wt 181.6 lb

## 2024-12-12 DIAGNOSIS — Z87898 Personal history of other specified conditions: Secondary | ICD-10-CM | POA: Insufficient documentation

## 2024-12-12 DIAGNOSIS — F419 Anxiety disorder, unspecified: Secondary | ICD-10-CM | POA: Insufficient documentation

## 2024-12-12 DIAGNOSIS — M810 Age-related osteoporosis without current pathological fracture: Secondary | ICD-10-CM | POA: Diagnosis not present

## 2024-12-12 DIAGNOSIS — Z8719 Personal history of other diseases of the digestive system: Secondary | ICD-10-CM | POA: Diagnosis present

## 2024-12-12 DIAGNOSIS — Z111 Encounter for screening for respiratory tuberculosis: Secondary | ICD-10-CM | POA: Insufficient documentation

## 2024-12-12 DIAGNOSIS — M47816 Spondylosis without myelopathy or radiculopathy, lumbar region: Secondary | ICD-10-CM | POA: Diagnosis not present

## 2024-12-12 DIAGNOSIS — J479 Bronchiectasis, uncomplicated: Secondary | ICD-10-CM | POA: Insufficient documentation

## 2024-12-12 DIAGNOSIS — M19072 Primary osteoarthritis, left ankle and foot: Secondary | ICD-10-CM | POA: Insufficient documentation

## 2024-12-12 DIAGNOSIS — R918 Other nonspecific abnormal finding of lung field: Secondary | ICD-10-CM | POA: Diagnosis not present

## 2024-12-12 DIAGNOSIS — M19071 Primary osteoarthritis, right ankle and foot: Secondary | ICD-10-CM | POA: Insufficient documentation

## 2024-12-12 DIAGNOSIS — Z8669 Personal history of other diseases of the nervous system and sense organs: Secondary | ICD-10-CM | POA: Diagnosis present

## 2024-12-12 DIAGNOSIS — M4004 Postural kyphosis, thoracic region: Secondary | ICD-10-CM | POA: Insufficient documentation

## 2024-12-12 DIAGNOSIS — Z8639 Personal history of other endocrine, nutritional and metabolic disease: Secondary | ICD-10-CM | POA: Insufficient documentation

## 2024-12-12 DIAGNOSIS — M19042 Primary osteoarthritis, left hand: Secondary | ICD-10-CM | POA: Diagnosis present

## 2024-12-12 DIAGNOSIS — M0579 Rheumatoid arthritis with rheumatoid factor of multiple sites without organ or systems involvement: Secondary | ICD-10-CM | POA: Insufficient documentation

## 2024-12-12 DIAGNOSIS — M170 Bilateral primary osteoarthritis of knee: Secondary | ICD-10-CM | POA: Diagnosis not present

## 2024-12-12 DIAGNOSIS — Z79899 Other long term (current) drug therapy: Secondary | ICD-10-CM | POA: Diagnosis not present

## 2024-12-12 DIAGNOSIS — Z87442 Personal history of urinary calculi: Secondary | ICD-10-CM | POA: Insufficient documentation

## 2024-12-12 DIAGNOSIS — M19041 Primary osteoarthritis, right hand: Secondary | ICD-10-CM | POA: Insufficient documentation

## 2024-12-12 DIAGNOSIS — Z862 Personal history of diseases of the blood and blood-forming organs and certain disorders involving the immune mechanism: Secondary | ICD-10-CM | POA: Diagnosis present

## 2024-12-12 DIAGNOSIS — Z87891 Personal history of nicotine dependence: Secondary | ICD-10-CM | POA: Insufficient documentation

## 2024-12-13 ENCOUNTER — Ambulatory Visit (INDEPENDENT_AMBULATORY_CARE_PROVIDER_SITE_OTHER): Admitting: Otolaryngology

## 2024-12-13 ENCOUNTER — Telehealth: Payer: Self-pay

## 2024-12-13 ENCOUNTER — Encounter (INDEPENDENT_AMBULATORY_CARE_PROVIDER_SITE_OTHER): Payer: Self-pay | Admitting: Otolaryngology

## 2024-12-13 ENCOUNTER — Encounter: Payer: Self-pay | Admitting: Physician Assistant

## 2024-12-13 ENCOUNTER — Telehealth: Payer: Self-pay | Admitting: *Deleted

## 2024-12-13 ENCOUNTER — Telehealth: Payer: Self-pay | Admitting: Pharmacist

## 2024-12-13 VITALS — BP 145/74 | HR 110 | Temp 98.4°F | Ht 65.0 in | Wt 180.0 lb

## 2024-12-13 DIAGNOSIS — K219 Gastro-esophageal reflux disease without esophagitis: Secondary | ICD-10-CM | POA: Diagnosis not present

## 2024-12-13 DIAGNOSIS — Z111 Encounter for screening for respiratory tuberculosis: Secondary | ICD-10-CM | POA: Insufficient documentation

## 2024-12-13 MED ORDER — PANTOPRAZOLE SODIUM 40 MG PO TBEC: 40.0000 mg | DELAYED_RELEASE_TABLET | Freq: Two times a day (BID) | ORAL | 1 refills | Status: AC

## 2024-12-13 NOTE — Telephone Encounter (Signed)
 ENT note is now in Epic on 12/13/2024.   Devki- Are you putting still placing infusion orders for us ?

## 2024-12-13 NOTE — Progress Notes (Signed)
 Reason for Consult: Throat irritation Referring Physician: Dr. Maree Sharlet Tiffany Pollard is an 74 y.o. female.  HPI: History of rheumatoid arthritis and needs to get back on her Biologics.  It was a concern that she might be having an infection so she is here just to be checked.  She had thrush secondary to her inhalers and they switch that around and she no longer has that.  She has a intermittent sore throat.  The sore throat lasts no longer than a day and then she is better.  It never is associated with dysphagia or dyne aphasia.  She has some mucus feeling in her throat regularly.  She has not had any exudate or findings in her throat since the thrush.  She has a little bit of nose congestion.  No purulent drainage.  No facial pressure or pain.  She does have significant reflux and elevates her bed but only takes Prevacid currently.  Past Medical History:  Diagnosis Date   GERD (gastroesophageal reflux disease)    High blood pressure    Rheumatoid arthritis(714.0)     Past Surgical History:  Procedure Laterality Date   BACK SURGERY     CERVICAL CONIZATION W/BX     COLONOSCOPY N/A 12/27/2013   Procedure: COLONOSCOPY;  Surgeon: Claudis RAYMOND Rivet, MD;  Location: [facility];  Service: Endoscopy;  Laterality: N/A;  320-moved to 125 Ann to notify pt   FINGER FRACTURE SURGERY     foot surgery for a bunion     NASAL SINUS SURGERY     TONSILLECTOMY      History reviewed. No pertinent family history.  Social History:  reports that she quit smoking about 20 years ago. Her smoking use included cigarettes. She started smoking about 50 years ago. She has a 37.5 pack-year smoking history. She has been exposed to tobacco smoke. She has never used smokeless tobacco. She reports that she does not drink alcohol  and does not use drugs.  Allergies: Allergies[1]   No results found for this or any previous visit (from the past 48 hours).  No results found.  ROS Blood pressure (!) 145/74, pulse (!) 110,  temperature 98.4 F (36.9 C), height 5' 5 (1.651 m), weight 180 lb (81.6 kg), SpO2 96%. Physical Exam Constitutional:      Appearance: Normal appearance.  HENT:     Head: Normocephalic and atraumatic.     Right Ear: Tympanic membrane is without lesions and middle ear aerated, ear canal and external ear normal.     Left Ear: Tympanic membrane is without lesions and middle ear aerated, ear canal and external ear normal.     Nose: Nose without deviation of septum.  Turbinates with mild hypertrophy, No significant swelling or masses.     Oral cavity/oropharynx: Mucous membranes are moist. No lesions or masses    Larynx: normal voice. Mirror attempted without success    Eyes:     Extraocular Movements: Extraocular movements intact.     Conjunctiva/sclera: Conjunctivae normal.     Pupils: Pupils are equal, round, and reactive to light.  Cardiovascular:     Rate and Rhythm: Normal rate.  Pulmonary:     Effort: Pulmonary effort is normal.  Musculoskeletal:     Cervical back: Normal range of motion and neck supple. No rigidity.  Lymphadenopathy:     Cervical: No cervical adenopathy or masses.salivary glands without lesions. .     Salivary glands- no mass or swelling Neurological:     Mental  Status: He is alert. CN 2-12 intact. No nystagmus      Assessment/Plan: Laryngal pharyngeal reflux-I do not see any evidence of infection in her throat.  Her symptoms are not consistent with any infectious process in the pharynx.  Her nose also does not sound like she is having sinusitis.  I did discuss getting a CT scan of her sinuses just to be sure there is no smoldering sinus issue but she does not want to proceed with that given the lack of clinical indicators.  She will try reflux treatment to try to improve the intermittent pharyngeal symptoms.  She can resume her Biologics if indicated.  Tiffany Pollard 12/13/2024, 8:45 AM        [1]  Allergies Allergen Reactions   Tetanus Toxoid Swelling     Reaction at age 21.

## 2024-12-13 NOTE — Telephone Encounter (Deleted)
 Orders are active. Patient can contact [facility] [phone]) [place] to schedule infusion

## 2024-12-13 NOTE — Telephone Encounter (Addendum)
 Infusion orders expired (last entered Jan 2025). Per Waddell Craze, PA-C, patient does not have to reload.  Once benefits are reverified, [facility] will reach out to patient for scheduling.  Sherry Pennant, PharmD, MPH, BCPS, CPP Clinical Pharmacist

## 2024-12-13 NOTE — Telephone Encounter (Signed)
 Patient states she went to see the ENT today and there is no infection. Patient is calling the infusion center to set up her Orencia  infusion.

## 2024-12-13 NOTE — Telephone Encounter (Signed)
 Auth Submission: NO AUTH NEEDED Site of care: Site of care: [facility] Payer: medicare a/b, aarp supp Medication & CPT/J Code(s) submitted: Orencia  (Abatacept ) G9870 Route of submission (phone, fax, portal): portal Phone # Fax # Auth type: Buy/Bill HB Units/visits requested: 750mg , q4weeks Reference number:  Approval from: 1/7/26to 12/06/25

## 2024-12-13 NOTE — Telephone Encounter (Signed)
 Referral placed for Orencia  (abatacept ) IV (G9870) at [facility] [phone]) [place] to start benefits investigation.  Diagnosis: rheumatoid arthritis (RA)  Provider: Waddell Craze, PA-C and Dr. Maya Nash  Dose: 750mg  IV every 4 weeks (will not reload per Waddell Craze, PA-C). Premedications: acetaminophen  650mg  p.o. and diphenhydramine  25mg  p.o. Labs to be drawn with infusions: CBC/CMP every 3 months and TB gold yearly  Last dose/infusion date: 09/22/2024 Last Clinic Visit: 12/12/2024 Next Clinic Visit: 05/15/2025  Pertinent baseline labs: TB gold negative 03/07/2024; CBC/CMP on 09/22/2024  Once benefits are processed, infusion center will contact patient to schedule.

## 2024-12-13 NOTE — Addendum Note (Signed)
 Addended by: DAYNE SHERRY RAMAN on: 12/13/2024 10:36 AM   Modules accepted: Orders

## 2024-12-20 ENCOUNTER — Other Ambulatory Visit: Payer: Self-pay | Admitting: Emergency Medicine

## 2024-12-20 ENCOUNTER — Encounter: Admitting: Internal Medicine

## 2024-12-20 VITALS — BP 124/75 | HR 104 | Temp 97.8°F | Resp 16

## 2024-12-20 DIAGNOSIS — M0579 Rheumatoid arthritis with rheumatoid factor of multiple sites without organ or systems involvement: Secondary | ICD-10-CM

## 2024-12-20 DIAGNOSIS — Z79899 Other long term (current) drug therapy: Secondary | ICD-10-CM

## 2024-12-20 DIAGNOSIS — Z111 Encounter for screening for respiratory tuberculosis: Secondary | ICD-10-CM

## 2024-12-20 MED ORDER — SODIUM CHLORIDE 0.9 % IV SOLN
750.0000 mg | Freq: Once | INTRAVENOUS | Status: AC
  Administered 2024-12-20: 750 mg via INTRAVENOUS
  Filled 2024-12-20: qty 30

## 2024-12-20 NOTE — Progress Notes (Signed)
 Diagnosis: , Rheumatoid Arthritis  Provider:  Cheryl Waddell RIGGERS  Procedure: IV Infusion  IV Type: Peripheral, IV Location: L Antecubital  , Orencia  (Abatacept ), Dose: 750 mg  Infusion Start Time: 0942  Infusion Stop Time: 1012  Post Infusion IV Care: Observation period completed  Discharge: Condition: Good, Destination: Home . AVS Declined  Performed by:  Azul Brumett R, LPN

## 2024-12-21 ENCOUNTER — Ambulatory Visit: Payer: Self-pay | Admitting: Rheumatology

## 2024-12-21 ENCOUNTER — Ambulatory Visit (HOSPITAL_BASED_OUTPATIENT_CLINIC_OR_DEPARTMENT_OTHER)
Admission: RE | Admit: 2024-12-21 | Discharge: 2024-12-21 | Disposition: A | Discharge status: Home / Self Care | Source: Ambulatory Visit | Attending: Acute Care | Admitting: Acute Care

## 2024-12-21 DIAGNOSIS — R911 Solitary pulmonary nodule: Secondary | ICD-10-CM | POA: Diagnosis present

## 2024-12-21 LAB — CBC WITH DIFFERENTIAL/PLATELET
Basophils Absolute: 0.1 x10E3/uL (ref 0.0–0.2)
Basos: 1 %
EOS (ABSOLUTE): 1.4 x10E3/uL — ABNORMAL HIGH (ref 0.0–0.4)
Eos: 15 %
Hematocrit: 41.2 % (ref 34.0–46.6)
Hemoglobin: 13.6 g/dL (ref 11.1–15.9)
Immature Grans (Abs): 0 x10E3/uL (ref 0.0–0.1)
Immature Granulocytes: 0 %
Lymphocytes Absolute: 1.7 x10E3/uL (ref 0.7–3.1)
Lymphs: 19 %
MCH: 28.4 pg (ref 26.6–33.0)
MCHC: 33 g/dL (ref 31.5–35.7)
MCV: 86 fL (ref 79–97)
Monocytes Absolute: 0.9 x10E3/uL (ref 0.1–0.9)
Monocytes: 10 %
Neutrophils Absolute: 5.1 x10E3/uL (ref 1.4–7.0)
Neutrophils: 55 %
Platelets: 220 x10E3/uL (ref 150–450)
RBC: 4.79 x10E6/uL (ref 3.77–5.28)
RDW: 12.3 % (ref 11.7–15.4)
WBC: 9.2 x10E3/uL (ref 3.4–10.8)

## 2024-12-21 LAB — COMPREHENSIVE METABOLIC PANEL WITH GFR
ALT: 24 IU/L (ref 0–32)
AST: 29 IU/L (ref 0–40)
Albumin: 4.2 g/dL (ref 3.8–4.8)
Alkaline Phosphatase: 129 IU/L (ref 49–135)
BUN/Creatinine Ratio: 18 (ref 12–28)
BUN: 18 mg/dL (ref 8–27)
Bilirubin Total: 0.2 mg/dL (ref 0.0–1.2)
CO2: 23 mmol/L (ref 20–29)
Calcium: 9.9 mg/dL (ref 8.7–10.3)
Chloride: 102 mmol/L (ref 96–106)
Creatinine, Ser: 1.02 mg/dL — ABNORMAL HIGH (ref 0.57–1.00)
Globulin, Total: 2.2 g/dL (ref 1.5–4.5)
Glucose: 79 mg/dL (ref 70–99)
Potassium: 4.2 mmol/L (ref 3.5–5.2)
Sodium: 142 mmol/L (ref 134–144)
Total Protein: 6.4 g/dL (ref 6.0–8.5)
eGFR: 58 mL/min/1.73 — ABNORMAL LOW

## 2024-12-21 NOTE — Progress Notes (Signed)
 CBC and CMP are stable.  Creatinine is elevated.  Patient should to increase water  intake and repeat creatinine in 1 month.  She should also discuss elevated creatinine with the PCP.

## 2024-12-29 ENCOUNTER — Telehealth: Payer: Self-pay

## 2024-12-29 ENCOUNTER — Encounter: Payer: Self-pay | Admitting: Acute Care

## 2024-12-29 ENCOUNTER — Ambulatory Visit: Admitting: Acute Care

## 2024-12-29 VITALS — BP 128/62 | HR 114 | Temp 97.8°F | Ht 65.0 in | Wt 180.6 lb

## 2024-12-29 DIAGNOSIS — Z87891 Personal history of nicotine dependence: Secondary | ICD-10-CM

## 2024-12-29 DIAGNOSIS — R911 Solitary pulmonary nodule: Secondary | ICD-10-CM

## 2024-12-29 DIAGNOSIS — J84112 Idiopathic pulmonary fibrosis: Secondary | ICD-10-CM | POA: Diagnosis not present

## 2024-12-29 DIAGNOSIS — R9389 Abnormal findings on diagnostic imaging of other specified body structures: Secondary | ICD-10-CM

## 2024-12-29 DIAGNOSIS — J849 Interstitial pulmonary disease, unspecified: Secondary | ICD-10-CM

## 2024-12-29 NOTE — Progress Notes (Signed)
 "  History of Present Illness Tiffany Pollard is a 74 y.o. female  former smoker, quit in 2005 with a 30 pack year smoking history,  with RA, on Orencia ., and followed by Dr. Brenna for surveillance of a lung nodule, and bronchiectasis. She is now followed by the IP team.  Synopsis 74 year old female, former smoking history quit smoking in 2005, husband with stage IV lung cancer.  She also has rheumatoid arthritis on Orencia .  She has had multiple pulmonary nodules in the past.  Initial CT scan was in March 2020.  General right middle lobe small pulmonary nodule less than 4 mm.  It has slowly changed over time.  Is now 6 mm in size.  Has been stable since her 2021 CT scan also in March 2021.  Repeat CT scan at [facility] completed in March 2022 stable 6 mm nodule.  Also has other small pulmonary nodules.  CT scan also reveals areas of bronchial wall thickening and bronchiectasis.  We discussed her rheumatoid arthritis which is well controlled on her current medication regimen.  From a respiratory standpoint she has no complaints today she is here for follow-up for surveillance of  a pulmonary nodule.    12/29/2024 Discussed the use of AI scribe software for clinical note transcription with the patient, who gave verbal consent to proceed.  History of Present Illness Pt. Presents for follow up after HRCT as surveillance of pulmonary nodules, and to monitor for progression of ILD. She states she has been doing pretty well. She feels she has seen a decline this year in her stamina. She had to stop her  Orencia  for 3 months due to lymphadenopathy. She restarted it this week.  She has experienced a decline in stamina over the past year, with increased shortness of breath, particularly when walking uphill or talking while walking. She attributes some of this to mouth breathing due to head congestion. Despite these symptoms, she can still perform physical activities.She maintains physical activity by  walking 20-25 minutes daily without getting out of breath, although she struggles with uphill walking.  We discussed that her CT shows ILD.She has a family history of pulmonary fibrosis, with her father, paternal aunt, and paternal cousin affected.  She has not had pulmonary function testing since 2019, which showed minimal obstructive airway disease at that time.She also has RA, so she has the potential of this being genetic as well as autoimmune. Per HRCT, the disease has not progressed.I am going to refer her to Dr. Geronimo as she needs to considered for anti-fibrotic therapy. She also has RA as noted above. She had PFT's in 2019 which showed mild obstruction. We will repeat PFT's and have her complete the questionnaire.   We discussed that  she had CAD on CT chest. She also has family  history of CAD in her father. She is on a statin twice weekly due to coronary artery calcifications. She has not seen a cardiologist yet, preferring to address her pulmonary fibrosis first. Her HDL levels were checked in December, but she does not recall the results.  Pulmonary nodules are stable and considered Benign. These can continue to be monitored with her bi annual HRCT's as surveillance of her pulmonary fibrosis.      Test Results: HRCT 12/21/2024 Pulmonary parenchymal pattern of interstitial lung disease, as detailed above, stable from 12/10/2023. Findings are categorized as probable UIP per consensus guidelines: Diagnosis of Idiopathic Pulmonary Fibrosis  2. Left renal stones. 3. Small hiatal hernia.  4. Aortic atherosclerosis (ICD10-I70.0). Coronary artery calcification.   CT Chest 12/10/2023 Bilateral apical nodular pleuroparenchymal thickening and architectural distortion is again noted, mild and similar to the prior study, most compatible with areas of post infectious or inflammatory scarring. A few scattered small pulmonary nodules are noted, grossly stable compared to the prior study, largest  of which is in the right middle lobe (axial image 98 of series 4) measuring up to 5 mm. No confluent consolidative airspace disease. No pleural effusions. High-resolution images demonstrate scattered areas of cylindrical and varicose bronchiectasis, most severe in the left lower lobe where there is associated thickening of the peribronchovascular interstitium along with some regional areas of architectural distortion and chronic volume loss, very similar to the prior study. These findings appear minimally progressive compared to the prior examination and are asymmetric involving the left lung to a greater extent than the right. Inspiratory and expiratory imaging demonstrates widespread air trapping indicative of small airways disease. No acute consolidative airspace disease. No pleural effusions.   Upper Abdomen: Aortic atherosclerosis.   Musculoskeletal: There are no aggressive appearing lytic or blastic lesions noted in the visualized portions of the skeleton.   IMPRESSION: 1. Fibrotic changes in the lungs, predominantly manifest as scattered areas of asymmetrically distributed bronchiectasis, grossly similar to prior study from 2019, favored to reflect areas of chronic post infectious or inflammatory scarring. Technically, findings could be categorized as probable usual interstitial pneumonia (UIP) per current ATS guidelines, however, given the stability compared to the prior study, UIP is not favored. 2. Multiple small pulmonary nodules grossly stable compared to prior study from 2019, considered benign. No imaging follow-up is recommended. 3. Aortic atherosclerosis, in addition to left main and 2 vessel coronary artery disease. Assessment for potential risk factor modification, dietary therapy or pharmacologic therapy may be warranted, if clinically indicated.          Latest Ref Rng & Units 12/20/2024   12:00 AM 09/22/2024   11:29 AM 07/21/2024   10:53 AM  CBC  WBC  3.4 - 10.8 x10E3/uL 9.2  7.6  9.4   Hemoglobin 11.1 - 15.9 g/dL 86.3  85.5  84.8   Hematocrit 34.0 - 46.6 % 41.2  44.7  46.1   Platelets 150 - 450 x10E3/uL 220  221  264        Latest Ref Rng & Units 12/20/2024   12:00 AM 09/22/2024   11:29 AM 07/21/2024   10:53 AM  BMP  Glucose 70 - 99 mg/dL 79  62  79   BUN 8 - 27 mg/dL 18  19  25    Creatinine 0.57 - 1.00 mg/dL 8.97  9.21  9.13   BUN/Creat Ratio 12 - 28 18  SEE NOTE:  SEE NOTE:   Sodium 134 - 144 mmol/L 142  140  138   Potassium 3.5 - 5.2 mmol/L 4.2  4.3  4.5   Chloride 96 - 106 mmol/L 102  104  100   CO2 20 - 29 mmol/L 23  28  29    Calcium 8.7 - 10.3 mg/dL 9.9  89.8  9.9     BNP No results found for: BNP  ProBNP No results found for: PROBNP  PFT No results found for: FEV1PRE, FEV1POST, FVCPRE, FVCPOST, TLC, DLCOUNC, PREFEV1FVCRT, PSTFEV1FVCRT  CT CHEST HIGH RESOLUTION Result Date: 12/22/2024 CLINICAL DATA:  Interstitial lung disease, lung nodule. EXAM: CT CHEST WITHOUT CONTRAST TECHNIQUE: Multidetector CT imaging of the chest was performed following the standard protocol without intravenous contrast. High  resolution imaging of the lungs, as well as inspiratory and expiratory imaging, was performed. RADIATION DOSE REDUCTION: This exam was performed according to the departmental dose-optimization program which includes automated exposure control, adjustment of the mA and/or kV according to patient size and/or use of iterative reconstruction technique. COMPARISON:  12/10/2023, 02/22/2018. FINDINGS: Cardiovascular: Atherosclerotic calcification of the aorta, aortic valve and coronary arteries. Heart size normal. No pericardial effusion. Mediastinum/Nodes: Small mediastinal lymph nodes measure up to 9 mm in the low right paratracheal station, similar. Hilar regions are difficult to definitively evaluate without IV contrast. No axillary adenopathy. Esophagus is grossly unremarkable. Lungs/Pleura: Mid and lower lung  zone predominant subpleural reticulation, coarsened ground-glass and traction bronchiectasis/bronchiolectasis, unchanged from 12/10/2023. Biapical pleuroparenchymal scarring with a slightly nodular component on the right, as before. Additional scattered small pulmonary nodules are unchanged and considered benign. No pleural fluid. Airway is unremarkable. Mild air trapping. No excessive dynamic airway collapse. Upper Abdomen: Low-attenuation lesions in the left kidney. No specific follow-up necessary. Left renal stones and/or dependent milk of calcium. Small hiatal hernia. Visualized portions of the liver, gallbladder, adrenal glands, kidneys, spleen, pancreas, stomach and bowel are otherwise grossly unremarkable. No upper abdominal adenopathy. Musculoskeletal: Osteopenia.  Degenerative changes in the spine. IMPRESSION: 1. Pulmonary parenchymal pattern of interstitial lung disease, as detailed above, stable from 12/10/2023. Findings are categorized as probable UIP per consensus guidelines: Diagnosis of Idiopathic Pulmonary Fibrosis: An Official ATS/ERS/JRS/ALAT Clinical Practice Guideline. Am Tiffany Pollard Vol 198, Iss 5, [phone], Aug 07 2017. 2. Left renal stones. 3. Small hiatal hernia. 4. Aortic atherosclerosis (ICD10-I70.0). Coronary artery calcification. Electronically Signed   By: Newell Eke M.D.   On: 12/22/2024 14:59     Past medical hx Past Medical History:  Diagnosis Date   GERD (gastroesophageal reflux disease)    High blood pressure    Rheumatoid arthritis(714.0)      Social History[1]  Tiffany Pollard reports that she quit smoking about 20 years ago. Her smoking use included cigarettes. She started smoking about 51 years ago. She has a 37.5 pack-year smoking history. She has been exposed to tobacco smoke. She has never used smokeless tobacco. She reports that she does not drink alcohol  and does not use drugs.  Tobacco Cessation: Counseling given: Not Answered Former smoker,  quit 2005 with a 37.5 pack year smoking history  Past surgical hx, Family hx, Social hx all reviewed.  Current Outpatient Medications on File Prior to Visit  Medication Sig   Abatacept  (ORENCIA  IV) Inject 750 mg into the vein every 28 (twenty-eight) days.   acetaminophen  (TYLENOL ) 325 MG tablet Take 650 mg by mouth as needed.   ANORO ELLIPTA 62.5-25 MCG/ACT AEPB    Ascorbic Acid (VITAMIN C) 1000 MG tablet Take 1,000 mg by mouth daily.   aspirin 81 MG EC tablet Take 81 mg by mouth daily. Swallow whole.   azelastine (ASTELIN) 0.1 % nasal spray    Calcium Carb-Cholecalciferol (CALCIUM 1000 + D PO)    diazepam (VALIUM) 5 MG tablet Take 5 mg by mouth 3 (three) times daily as needed.   diphenhydrAMINE  (BENADRYL ) 25 mg capsule Take 25 mg by mouth as needed. With Orencia  infusions   fish oil-omega-3 fatty acids 1000 MG capsule Take 2 g by mouth daily.   levocetirizine (XYZAL) 2.5 MG/5ML solution Take 2.5 mg by mouth daily.   lisinopril (ZESTRIL) 40 MG tablet Take 40 mg by mouth daily.   Multiple Vitamins-Minerals (MULTIVITAMIN PO) Take by mouth.   pantoprazole  (PROTONIX ) 40  MG tablet Take 1 tablet (40 mg total) by mouth 2 (two) times daily.   rosuvastatin (CRESTOR) 10 MG tablet Take by mouth.   VENTOLIN HFA 108 (90 Base) MCG/ACT inhaler USE 2 PUFFS FOUR TIMES DAILY AS NEEDED   VITAMIN E PO Take 1 tablet by mouth daily.   No current facility-administered medications on file prior to visit.     Allergies[2]  Review Of Systems:  Constitutional:   No  weight loss, night sweats,  Fevers, chills, fatigue, or  lassitude.  HEENT:   No headaches,  Difficulty swallowing,  Tooth/dental problems, or  Sore throat,                No sneezing, itching, ear ache, nasal congestion, post nasal drip,   CV:  No chest pain,  Orthopnea, PND, swelling in lower extremities, anasarca, dizziness, palpitations, syncope.   GI  No heartburn, indigestion, abdominal pain, nausea, vomiting, diarrhea, change in bowel  habits, loss of appetite, bloody stools.   Resp: + shortness of breath with exertion less at rest.  No excess mucus, no productive cough,  No non-productive cough,  No coughing up of blood.  No change in color of mucus.  No wheezing.  No chest wall deformity  Skin: no rash or lesions.  GU: no dysuria, change in color of urine, no urgency or frequency.  No flank pain, no hematuria   MS:  No joint pain or swelling.  No decreased range of motion.  No back pain.  Psych:  No change in mood or affect. No depression or anxiety.  No memory loss.   Vital Signs BP (!) 142/79   Pulse (!) 114   Temp 97.8 F (36.6 C) (Oral)   Ht 5' 5 (1.651 m)   Wt 180 lb 9.6 oz (81.9 kg)   SpO2 98%   BMI 30.05 kg/m    Physical Exam:  General- No distress,  A&Ox3 ENT: No sinus tenderness, TM clear, pale nasal mucosa, no oral exudate,no post nasal drip, no LAN Cardiac: S1, S2, regular rate and rhythm, no murmur Chest: No wheeze/ rales/ dullness; no accessory muscle use, no nasal flaring, no sternal retractions Abd.: Soft Non-tender Ext: No clubbing cyanosis, edema Neuro:  normal strength Skin: No rashes, warm and dry Psych: normal mood and behavior  Physical Exam GENERAL: No distress, alert and oriented times 3. EARS NOSE THROAT: No sinus tenderness, tympanic membranes clear, pale nasal mucosa, no oral exudate, no post nasal drip, no lymphadenopathy. CHEST: Crackles in right lung base. No wheeze, rales, dullness, no accessory muscle use, no nasal flaring, no sternal retractions. CARDIAC: S1, S2, regular rate and rhythm, no murmur. ABDOMINAL: Soft, non tender. ND, BS present. EXTREMITIES: No clubbing, cyanosis, edema. No obvious deformities. NEUROLOGICAL: Normal strength. Alert and oriented x 3, MAE x 4. SKIN: No rashes, warm and dry. No obvious skin lesions. PSYCHIATRIC: Normal mood and behavior.    Assessment & Plan Idiopathic pulmonary fibrosis per HRCT Family history noted.  Symptoms include  decreased stamina and increased dyspnea.  No progression on recent scan.  Differential includes genetic and autoimmune etiologies. (RA) Mild obstructive airway disease noted in past spirometry in 2019.  - Referred to Dr. Geronimo for further evaluation and management. - Ordered pulmonary function tests to establish baseline lung function. - Provided questionnaire for completion prior to Dr. Reeves appointment.  Pulmonary Nodules, stable and presumed benign Former smoker  Plan Continued surveillance with ILD monitoring Call to be seen for any blood when you  cough or unexplained weight loss.   Elevated BP in the office today, initially 142/79, upon second check 128/62. She states she has been stressed.   I spent 25 minutes dedicated to the care of this patient on the date of this encounter to include pre-visit review of records, face-to-face time with the patient discussing conditions above, post visit ordering of testing, clinical documentation with the electronic health record, making appropriate referrals as documented, and communicating necessary information to the patient's healthcare team.      Lauraine JULIANNA Lites, NP 12/29/2024  9:07 AM             [1]  Social History Tobacco Use   Smoking status: Former    Current packs/day: 0.00    Average packs/day: 1.3 packs/day for 30.0 years (37.5 ttl pk-yrs)    Types: Cigarettes    Start date: 01/04/1974    Quit date: 01/05/2004    Years since quitting: 20.9    Passive exposure: Past   Smokeless tobacco: Never  Vaping Use   Vaping status: Never Used  Substance Use Topics   Alcohol  use: No   Drug use: No  [2]  Allergies Allergen Reactions   Tetanus Toxoid Swelling    Reaction at age 39.    "

## 2024-12-29 NOTE — Telephone Encounter (Signed)
 Patient scheduled.

## 2024-12-29 NOTE — Telephone Encounter (Signed)
 Can you schedule her pulmonary function test at [facility] for her prior to her visit with [facility] on Feb 10th

## 2024-12-29 NOTE — Patient Instructions (Addendum)
 It is good to see you today. We have reviewed your high-resolution CT chest. There are pulmonary nodules which we have been monitoring are stable which is great news. These can be followed on your imaging for ILD moving forward. As we discussed the CT also shows a pulmonary pattern of interstitial lung disease. With your strong family history and your rheumatoid arthritis I think it is best to go ahead and have you see one of the interstitial lung disease specialist in the office. I have referred you to Dr. Geronimo to be seen. You will get a call from the office to get this scheduled. I have ordered pulmonary function testing as a baseline for comparison of future testing. We will have you fill out the interstitial lung disease questionnaire and bring it with you to the office when you see Dr. Geronimo. We have also discussed that you have some coronary artery disease. You are currently on statin by your primary care physician. We discussed today whether or not you wanted me to refer you to cardiology and you said you would prefer to get your ILD under control and then reach out to them. Please let me know if you change your mind about this because we can make the referral sooner. Call if you need us  sooner. Please contact office for sooner follow up if symptoms do not improve or worsen or seek emergency care

## 2025-01-10 ENCOUNTER — Ambulatory Visit (HOSPITAL_COMMUNITY)
Admission: RE | Admit: 2025-01-10 | Discharge: 2025-01-10 | Disposition: A | Source: Ambulatory Visit | Attending: Acute Care

## 2025-01-10 DIAGNOSIS — J84112 Idiopathic pulmonary fibrosis: Secondary | ICD-10-CM

## 2025-01-10 LAB — PULMONARY FUNCTION TEST
DL/VA % pred: 99 %
DL/VA: 4.08 ml/min/mmHg/L
DLCO cor % pred: 94 %
DLCO cor: 18.7 ml/min/mmHg
DLCO unc % pred: 95 %
DLCO unc: 18.81 ml/min/mmHg
FEF 25-75 Post: 3.64 L/s
FEF 25-75 Pre: 2.47 L/s
FEF2575-%Change-Post: 47 %
FEF2575-%Pred-Post: 195 %
FEF2575-%Pred-Pre: 132 %
FEV1-%Change-Post: 9 %
FEV1-%Pred-Post: 114 %
FEV1-%Pred-Pre: 104 %
FEV1-Post: 2.63 L
FEV1-Pre: 2.41 L
FEV1FVC-%Change-Post: 5 %
FEV1FVC-%Pred-Pre: 107 %
FEV6-%Change-Post: 4 %
FEV6-%Pred-Post: 105 %
FEV6-%Pred-Pre: 101 %
FEV6-Post: 3.08 L
FEV6-Pre: 2.95 L
FEV6FVC-%Change-Post: 0 %
FEV6FVC-%Pred-Post: 104 %
FEV6FVC-%Pred-Pre: 103 %
FVC-%Change-Post: 3 %
FVC-%Pred-Post: 101 %
FVC-%Pred-Pre: 97 %
FVC-Post: 3.09 L
FVC-Pre: 2.98 L
Post FEV1/FVC ratio: 85 %
Post FEV6/FVC ratio: 100 %
Pre FEV1/FVC ratio: 81 %
Pre FEV6/FVC Ratio: 99 %
RV % pred: 115 %
RV: 2.64 L
TLC % pred: 111 %
TLC: 5.82 L

## 2025-01-10 MED ORDER — ALBUTEROL SULFATE (2.5 MG/3ML) 0.083% IN NEBU
2.5000 mg | INHALATION_SOLUTION | Freq: Once | RESPIRATORY_TRACT | Status: AC
  Administered 2025-01-10: 2.5 mg via RESPIRATORY_TRACT

## 2025-01-16 ENCOUNTER — Encounter: Admitting: Internal Medicine

## 2025-01-17 ENCOUNTER — Ambulatory Visit

## 2025-05-15 ENCOUNTER — Encounter: Attending: Rheumatology | Admitting: Rheumatology
